# Patient Record
Sex: Female | Born: 1980
Health system: Southern US, Community
[De-identification: ages and names within clinical notes are randomized; demographics above are authoritative.]

## PROBLEM LIST (undated history)

## (undated) DIAGNOSIS — K219 Gastro-esophageal reflux disease without esophagitis: Secondary | ICD-10-CM

## (undated) DIAGNOSIS — F329 Major depressive disorder, single episode, unspecified: Secondary | ICD-10-CM

## (undated) DIAGNOSIS — I10 Essential (primary) hypertension: Secondary | ICD-10-CM

## (undated) DIAGNOSIS — F32A Depression, unspecified: Secondary | ICD-10-CM

## (undated) DIAGNOSIS — E039 Hypothyroidism, unspecified: Secondary | ICD-10-CM

## (undated) DIAGNOSIS — J189 Pneumonia, unspecified organism: Secondary | ICD-10-CM

## (undated) DIAGNOSIS — R51 Headache: Secondary | ICD-10-CM

## (undated) DIAGNOSIS — D649 Anemia, unspecified: Secondary | ICD-10-CM

## (undated) HISTORY — PX: OTHER SURGICAL HISTORY: SHX169

## (undated) HISTORY — DX: Essential (primary) hypertension: I10

---
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
## 1999-01-17 ENCOUNTER — Inpatient Hospital Stay (HOSPITAL_COMMUNITY): Admission: AD | Admit: 1999-01-17 | Discharge: 1999-01-17 | Payer: Self-pay | Admitting: Family Medicine

## 1999-06-09 ENCOUNTER — Inpatient Hospital Stay (HOSPITAL_COMMUNITY): Admission: EM | Admit: 1999-06-09 | Discharge: 1999-06-09 | Payer: Self-pay | Admitting: Obstetrics & Gynecology

## 2000-08-19 ENCOUNTER — Emergency Department (HOSPITAL_COMMUNITY): Admission: EM | Admit: 2000-08-19 | Discharge: 2000-08-19 | Payer: Self-pay

## 2000-12-28 ENCOUNTER — Inpatient Hospital Stay (HOSPITAL_COMMUNITY): Admission: AD | Admit: 2000-12-28 | Discharge: 2000-12-28 | Payer: Self-pay | Admitting: Obstetrics and Gynecology

## 2001-01-13 ENCOUNTER — Inpatient Hospital Stay (HOSPITAL_COMMUNITY): Admission: AD | Admit: 2001-01-13 | Discharge: 2001-01-13 | Payer: Self-pay | Admitting: Obstetrics and Gynecology

## 2001-04-14 ENCOUNTER — Inpatient Hospital Stay (HOSPITAL_COMMUNITY): Admission: AD | Admit: 2001-04-14 | Discharge: 2001-04-16 | Payer: Self-pay | Admitting: Obstetrics and Gynecology

## 2001-04-29 ENCOUNTER — Inpatient Hospital Stay (HOSPITAL_COMMUNITY): Admission: AD | Admit: 2001-04-29 | Discharge: 2001-04-29 | Payer: Self-pay | Admitting: Obstetrics & Gynecology

## 2001-05-02 ENCOUNTER — Inpatient Hospital Stay (HOSPITAL_COMMUNITY): Admission: AD | Admit: 2001-05-02 | Discharge: 2001-05-02 | Payer: Self-pay | Admitting: Obstetrics & Gynecology

## 2001-05-07 ENCOUNTER — Inpatient Hospital Stay (HOSPITAL_COMMUNITY): Admission: AD | Admit: 2001-05-07 | Discharge: 2001-05-07 | Payer: Self-pay | Admitting: Obstetrics and Gynecology

## 2001-05-15 ENCOUNTER — Inpatient Hospital Stay (HOSPITAL_COMMUNITY): Admission: AD | Admit: 2001-05-15 | Discharge: 2001-05-15 | Payer: Self-pay | Admitting: Obstetrics & Gynecology

## 2001-05-22 ENCOUNTER — Inpatient Hospital Stay (HOSPITAL_COMMUNITY): Admission: AD | Admit: 2001-05-22 | Discharge: 2001-05-25 | Payer: Self-pay | Admitting: Obstetrics and Gynecology

## 2001-07-06 ENCOUNTER — Emergency Department (HOSPITAL_COMMUNITY): Admission: EM | Admit: 2001-07-06 | Discharge: 2001-07-07 | Payer: Self-pay | Admitting: *Deleted

## 2001-11-13 ENCOUNTER — Emergency Department (HOSPITAL_COMMUNITY): Admission: EM | Admit: 2001-11-13 | Discharge: 2001-11-13 | Payer: Self-pay | Admitting: Emergency Medicine

## 2003-02-18 ENCOUNTER — Emergency Department (HOSPITAL_COMMUNITY): Admission: EM | Admit: 2003-02-18 | Discharge: 2003-02-18 | Payer: Self-pay | Admitting: *Deleted

## 2003-12-13 ENCOUNTER — Other Ambulatory Visit: Admission: RE | Admit: 2003-12-13 | Discharge: 2003-12-13 | Payer: Self-pay | Admitting: Family Medicine

## 2004-01-09 ENCOUNTER — Emergency Department (HOSPITAL_COMMUNITY): Admission: EM | Admit: 2004-01-09 | Discharge: 2004-01-09 | Payer: Self-pay | Admitting: *Deleted

## 2004-06-09 ENCOUNTER — Other Ambulatory Visit: Admission: RE | Admit: 2004-06-09 | Discharge: 2004-06-09 | Payer: Self-pay | Admitting: Obstetrics and Gynecology

## 2005-02-05 ENCOUNTER — Other Ambulatory Visit: Admission: RE | Admit: 2005-02-05 | Discharge: 2005-02-05 | Payer: Self-pay | Admitting: Obstetrics and Gynecology

## 2005-03-09 ENCOUNTER — Emergency Department (HOSPITAL_COMMUNITY): Admission: EM | Admit: 2005-03-09 | Discharge: 2005-03-09 | Payer: Self-pay | Admitting: Family Medicine

## 2005-04-21 ENCOUNTER — Emergency Department (HOSPITAL_COMMUNITY): Admission: EM | Admit: 2005-04-21 | Discharge: 2005-04-21 | Payer: Self-pay | Admitting: Family Medicine

## 2005-06-15 ENCOUNTER — Other Ambulatory Visit: Admission: RE | Admit: 2005-06-15 | Discharge: 2005-06-15 | Payer: Self-pay | Admitting: Obstetrics and Gynecology

## 2005-06-24 ENCOUNTER — Ambulatory Visit (HOSPITAL_COMMUNITY): Admission: RE | Admit: 2005-06-24 | Discharge: 2005-06-24 | Payer: Self-pay | Admitting: Obstetrics and Gynecology

## 2005-10-21 ENCOUNTER — Inpatient Hospital Stay (HOSPITAL_COMMUNITY): Admission: AD | Admit: 2005-10-21 | Discharge: 2005-10-21 | Payer: Self-pay | Admitting: Obstetrics and Gynecology

## 2005-10-30 ENCOUNTER — Inpatient Hospital Stay (HOSPITAL_COMMUNITY): Admission: AD | Admit: 2005-10-30 | Discharge: 2005-10-30 | Payer: Self-pay | Admitting: Obstetrics and Gynecology

## 2005-11-15 ENCOUNTER — Inpatient Hospital Stay (HOSPITAL_COMMUNITY): Admission: AD | Admit: 2005-11-15 | Discharge: 2005-11-15 | Payer: Self-pay | Admitting: Obstetrics and Gynecology

## 2005-11-20 ENCOUNTER — Inpatient Hospital Stay (HOSPITAL_COMMUNITY): Admission: AD | Admit: 2005-11-20 | Discharge: 2005-11-20 | Payer: Self-pay | Admitting: Obstetrics and Gynecology

## 2006-01-03 ENCOUNTER — Inpatient Hospital Stay (HOSPITAL_COMMUNITY): Admission: AD | Admit: 2006-01-03 | Discharge: 2006-01-03 | Payer: Self-pay | Admitting: Obstetrics and Gynecology

## 2006-01-14 ENCOUNTER — Inpatient Hospital Stay (HOSPITAL_COMMUNITY): Admission: AD | Admit: 2006-01-14 | Discharge: 2006-01-14 | Payer: Self-pay | Admitting: Obstetrics and Gynecology

## 2006-01-16 ENCOUNTER — Inpatient Hospital Stay (HOSPITAL_COMMUNITY): Admission: AD | Admit: 2006-01-16 | Discharge: 2006-01-16 | Payer: Self-pay | Admitting: Obstetrics and Gynecology

## 2006-01-24 ENCOUNTER — Inpatient Hospital Stay (HOSPITAL_COMMUNITY): Admission: AD | Admit: 2006-01-24 | Discharge: 2006-01-24 | Payer: Self-pay | Admitting: Obstetrics and Gynecology

## 2006-01-28 ENCOUNTER — Inpatient Hospital Stay (HOSPITAL_COMMUNITY): Admission: AD | Admit: 2006-01-28 | Discharge: 2006-01-31 | Payer: Self-pay | Admitting: Obstetrics and Gynecology

## 2006-02-06 ENCOUNTER — Emergency Department (HOSPITAL_COMMUNITY): Admission: EM | Admit: 2006-02-06 | Discharge: 2006-02-06 | Payer: Self-pay | Admitting: Family Medicine

## 2007-03-16 ENCOUNTER — Encounter: Admission: RE | Admit: 2007-03-16 | Discharge: 2007-03-16 | Payer: Self-pay | Admitting: Emergency Medicine

## 2007-06-05 ENCOUNTER — Emergency Department (HOSPITAL_COMMUNITY): Admission: EM | Admit: 2007-06-05 | Discharge: 2007-06-05 | Payer: Self-pay | Admitting: Emergency Medicine

## 2007-10-08 ENCOUNTER — Inpatient Hospital Stay (HOSPITAL_COMMUNITY): Admission: AD | Admit: 2007-10-08 | Discharge: 2007-10-08 | Payer: Self-pay | Admitting: Obstetrics & Gynecology

## 2007-10-11 ENCOUNTER — Inpatient Hospital Stay (HOSPITAL_COMMUNITY): Admission: AD | Admit: 2007-10-11 | Discharge: 2007-10-11 | Payer: Self-pay | Admitting: Gynecology

## 2007-10-20 ENCOUNTER — Ambulatory Visit: Payer: Self-pay | Admitting: Obstetrics & Gynecology

## 2007-10-21 ENCOUNTER — Emergency Department (HOSPITAL_COMMUNITY): Admission: EM | Admit: 2007-10-21 | Discharge: 2007-10-21 | Payer: Self-pay | Admitting: Family Medicine

## 2007-11-22 ENCOUNTER — Ambulatory Visit (HOSPITAL_COMMUNITY): Admission: RE | Admit: 2007-11-22 | Discharge: 2007-11-22 | Payer: Self-pay | Admitting: Obstetrics & Gynecology

## 2008-04-06 ENCOUNTER — Emergency Department (HOSPITAL_COMMUNITY): Admission: EM | Admit: 2008-04-06 | Discharge: 2008-04-06 | Payer: Self-pay | Admitting: Emergency Medicine

## 2009-08-26 ENCOUNTER — Ambulatory Visit: Payer: Self-pay | Admitting: Obstetrics & Gynecology

## 2009-08-26 LAB — CONVERTED CEMR LAB: Pap Smear: NEGATIVE

## 2009-10-03 ENCOUNTER — Ambulatory Visit (HOSPITAL_COMMUNITY): Admission: RE | Admit: 2009-10-03 | Discharge: 2009-10-03 | Payer: Self-pay | Admitting: Obstetrics & Gynecology

## 2009-10-03 ENCOUNTER — Ambulatory Visit: Payer: Self-pay | Admitting: Obstetrics & Gynecology

## 2009-10-30 ENCOUNTER — Ambulatory Visit: Payer: Self-pay | Admitting: Obstetrics & Gynecology

## 2009-10-30 ENCOUNTER — Ambulatory Visit (HOSPITAL_COMMUNITY): Admission: RE | Admit: 2009-10-30 | Discharge: 2009-10-30 | Payer: Self-pay | Admitting: Obstetrics & Gynecology

## 2010-01-09 ENCOUNTER — Ambulatory Visit (HOSPITAL_COMMUNITY)
Admission: RE | Admit: 2010-01-09 | Discharge: 2010-01-09 | Payer: Self-pay | Source: Home / Self Care | Attending: Obstetrics and Gynecology | Admitting: Obstetrics and Gynecology

## 2010-01-09 ENCOUNTER — Ambulatory Visit: Payer: Self-pay | Admitting: Obstetrics and Gynecology

## 2010-02-16 ENCOUNTER — Encounter: Payer: Self-pay | Admitting: Obstetrics and Gynecology

## 2010-02-17 ENCOUNTER — Encounter: Payer: Self-pay | Admitting: Obstetrics & Gynecology

## 2010-04-10 LAB — PREGNANCY, URINE: Preg Test, Ur: NEGATIVE

## 2010-04-10 LAB — CBC
HCT: 41.7 % (ref 36.0–46.0)
MCH: 33.3 pg (ref 26.0–34.0)
RDW: 13.7 % (ref 11.5–15.5)

## 2010-06-10 NOTE — Group Therapy Note (Signed)
NAME:  Diana Francis, Diana Francis NO.:  [id number]   MEDICAL RECORD NO.:  [id number]          PATIENT TYPE:  WOC   LOCATION:  [facility]                   FACILITY:  [facility]   PHYSICIAN:  Johnella Moloney, MD        DATE OF BIRTH:  [birth year]   DATE OF SERVICE:  10/20/2007                                  CLINIC NOTE   CHIEF COMPLAINT:  Ovarian cyst and light bleeding for 2 months.   HISTORY OF PRESENT ILLNESS:  The patient is a 30 year old gravida 3,  para 2-0-1-2 with a last menstrual period of August 19, 2007.  Of note,  patient is on a Mirena IUD,  who is here for 2 complaints.  First,  patient reports having left lower quadrant pain, and an ultrasound that  was done on October 11, 2007 showed a 4.1 simple cyst on the left  ovary.  The patient's 2nd issue is irregular bleeding.  She does report  having an IUD in place, and she has not had a menstrual period since  July 2009.  However, has had daily spotting.  The patient reports that  she has a history of a prolactinoma and severe migraines which precludes  the use of estrogen.  She wants to know if there is any other mode of  management of this irregular spotting.  Of note patient also reports  fevers.  She had a fever of 102 degrees Fahrenheit yesterday, and on  evaluation today she is noted to be 100.9.  She also has chills and  nausea.  She has 2 children that were treated for sore throat and  fevers.  She is worried about the H1N1 flu, and wants to be possibly  treated with Tamiflu; no other symptoms.   PAST OBSTETRIC/GYNECOLOGICAL HISTORY:  Menarche at age 43, regular  menstrual cycles, medium flow of the cycle, mild bleeding, irregular  menstrual periods since her Mirena IUD placement.  The patient has had 2  vaginal deliveries.  Her last delivery was on January 29, 2006.  She also  has had 1 miscarriage.  Her last Pap smear was on March 23rd, 2009 which  was normal, but she does have a history of abnormal Pap smear in  January  2006 which resulted in a LEEP.   PAST MEDICAL HISTORY:  1. Prolactinoma.  2. Migraines.  3. Pneumonia.   PAST SURGICAL HISTORY:  None.   SOCIAL HISTORY:  The patient lives with her 3 children.  She is  employed.  She smokes 1/2 pack a day and has smoked for 6 years.  She  denies any alcohol or illicit drug use.  She also denies any past or  current history of sexual or physical abuse.   FAMILIAL HISTORY:  Remarkable for diabetes, high blood pressure, heart  attack, and a grandfather who has lung, spinal, and shoulder cancer.  No  gynecologic cancers.   REVIEW OF SYSTEMS:  Remarkable for fevers, nausea, vomiting, and vaginal  bleeding.   PHYSICAL EXAMINATION:  Temperature 100.9, pulse 84, blood pressure  118/84, weight 143.1 pounds, height 64-1/2 inches.  GENERAL:  No apparent distress.  ABDOMEN:  Mild tenderness on palpation in the left lower quadrant.  No  rebound or guarding.  PELVIC:  Deferred as per patient request.   ASSESSMENT/PLAN:  The patient is a 30 year old gravida 3, para 2-0-1-2,  here with 2 main complaints.  As for her irregular spotting the patient  was told that this was a normal side effect of the Mirena IUD.  Sometimes spotting could be alleviated with some estrogen administration  in the form of oral contraceptive pills to stabilize the endometrial  lining.  However, given her history of a prolactinoma, severe migraines,  she is not a candidate for this.  She was told that it was always  possible to change birth control methods if this irregular bleeding  continues to be bothersome to her.  As for her ovarian cyst she did have  a simple cyst which is a physiologic cyst which could be causing her  pain.  The patient was offered Voltaren 50 mg p.o. t.i.d. p.r.n. pain.  Given that she does not have any relief after using Motrin, the patient  did agree with plan.  We will schedule her for another ultrasound in 6  weeks to document resolution of the  cyst.  She was told that ovarian  cysts were normal and happen monthly and is a normal physiologic  process, and that this could occur even when a Mirena IUD in place.  As  for the patient's fever, malaise, and possible exposure to children with  flu, the patient was told to follow up with her primary care physician  who will evaluate her and probably give her prescription for Tamiflu.           ______________________________  Johnella Moloney, MD     UD/MEDQ  D:  10/20/2007  T:  10/21/2007  Job:  [id number]

## 2010-06-10 NOTE — Assessment & Plan Note (Signed)
NAME:  Diana Francis, DEKONING NO.:  [id number]   MEDICAL RECORD NO.:  [id number]          PATIENT TYPE:  WOC   LOCATION:  [facility]         FACILITY:  [facility]   PHYSICIAN:  Allie Bossier, MD        DATE OF BIRTH:  25-Nov-1980   DATE OF SERVICE:  08/26/2009                                  CLINIC NOTE   Cheray is a 30 year old divorced, G3, P3, A1.  She is raising her 2  children plus an adopted child.  Her children are ages 60, 73, and 3 who  is here because she would like to have her IUD removed and would like to  discuss birth control options.  She is not keen on having abdominal  surgery and after discussing the Essure, she would like this form of  birth control.  She understands that the Essure will not be effective  for 3 months and she plans to leave her IUD in place until the tubal  occlusion is confirmed by the [facility].  She has no medical problems and her  only surgery was a LEEP done in 2004.   FAMILY HISTORY:  Negative for breast, GYN, and colon malignancies.   Only allergy is that to a BEE STING.  She is not allergic to medicines  or latex.   REVIEW OF SYSTEMS:  She had her Pap smear done last in 2009 and it was  normal.   MEDICATIONS:  The Mirena IUD was placed on March 2009.  She takes  ibuprofen on a p.r.n. basis.   PHYSICAL EXAMINATION:  GENERAL:  Well-nourished, well-hydrated female.  VITAL SIGNS:  Blood pressure 104/61, weight 147, height 5 feet 4 inches,  pulse 86, temperature 98.2.  HEENT:  Normal.  BREASTS:  Normal bilaterally.  HEART:  Regular rate and rhythm.  LUNGS:  Clear to auscultation bilaterally.  ABDOMEN:  Benign.  No palpable hepatosplenomegaly.  EXTERNAL GENITALIA:  No lesions.  Cervix normal status post LEEP.  IUD  strings are visible.  Uterus normal size and shape, anteverted, mobile,  slightly deviated to the left.  Adnexa nontender.  No masses.   ASSESSMENT AND PLAN:  1. Annual exam.  I have checked Pap smear and  recommended self-breast      exam.  2. Desires for sterility.  She understands there is a very very small      failure rate approximately 1:100 or less with the Essure and she      understands this will be a same day procedure.  All questions were      answered.  I will see her back 6 weeks after her surgery and her      surgery will be scheduled at the first availability.      Allie Bossier, MD    MCD/MEDQ  D:  08/26/2009  T:  08/27/2009  Job:  [id number]

## 2010-06-13 NOTE — Discharge Summary (Signed)
[facility]  Patient:    SULA, FETTERLY Visit Number: [id number] MRN: [id number]          Service Type: OBS Location: [facility] Attending Physician:  Mickle Mallory Dictated by:   Leilani Able, P.A. Admit Date:  05/15/2001 Discharge Date: 05/15/2001                             Discharge Summary  FINAL DIAGNOSES:              1. _______ [redacted] weeks gestation.                               2. Preterm labor.  HOSPITAL COURSE:              This 30 year old, G2, P0, presents at 33+ weeks gestation with preterm labor that was unresponsive to oral terbutaline and fluid. The patient was admitted for magnesium sulfate and Unasyn. Magnesium sulfate stopped her contractions and was slowly decreased and she was started on oral Procardia 10 mg every eight hours. Her contractions continued to be stopped even just with the oral Procardia, and, therefore, she was ready to be sent home.  DISPOSITION:                  The patient was sent home no a regular diet to continue rest and increase fluids.  DISCHARGE MEDICATIONS:        The patient was given Procardia 10 mg one t.i.d.  DISCHARGE FOLLOWUP:           The patient was to follow up on the office in three days. The patient was also to call if contractions began again. Dictated by:   Leilani Able, P.A. Attending Physician:  Mickle Mallory DD:  05/16/01 TD:  05/17/01 Job: [id number]

## 2010-06-13 NOTE — Op Note (Signed)
[facility]  Patient:    Diana Francis, Diana Francis Visit Number: [id number] MRN: [id number]          Service Type: OBS Location: 910A 9109 01 Attending Physician:  Miguel Aschoff Dictated by:   Devoria Albe Edward Jolly, M.D. Proc. Date: 05/23/01 Admit Date:  05/22/2001                             Operative Report  PREOPERATIVE DIAGNOSES:       1. Intrauterine gestation at 39+1 weeks.                               2. Nonreassuring fetal assessment.                               3. Meconium-stained amniotic fluid.  POSTOPERATIVE DIAGNOSES:      1. Intrauterine gestation at 39+1 weeks.                               2. Nonreassuring fetal assessment.                               3. Meconium-stained amniotic fluid.  PROCEDURE:                    Vacuum-assisted vaginal delivery with episiotomy and repair.  SURGEON:                      Brook A. Edward Jolly, M.D.  ESTIMATED BLOOD LOSS:         Less than 500 cc.  COMPLICATIONS:                None.  INDICATIONS FOR PROCEDURE:    The patient was a 30 year old gravida 2, para 0-0-1-0 African-American female at 39+[redacted] weeks gestation ([facility] May 28, 2001) who presented to the [facility] on May 23, 2001 reporting contractions since May 21, 2001.  Upon arrival to the [facility] on May 22, 2001 at approximately 2345, the cervix was noted to be 2-3 cm dilated.  The patient continued to have contractions overnight.  The next morning, her cervix was noted to be 3 cm dilated with 70% effacement and the vertex ballotable.  The membranes were noted to be intact.  The patient had a reassuring fetal heart rate tracing since admission.  She was noted to contract every 4-5 minutes. Pitocin augmentation of labor was performed and the patient went on to have artificial rupture of membranes, at which time time moderate meconium-stained amniotic fluid was noted.  An amnio infusion was therefore performed and a fetal scalp electrode  was placed.  The patient did receive an epidural for anesthesia during her labor.  The patients cervical dilation was complete at 1710.  At this time, deep variables were noted to the 80s with pushing.  A recommendation was made to proceed with a vacuum-assisted vaginal delivery based on the nonreassuring fetal assessment and the meconium-stained amniotic fluid.  The patient and family agreed to proceed after the risks and benefits were reviewed.  FINDINGS:                     A viable female was delivered  at 1742 with Apgars of 8 at one minute at 9 at five minutes.  The nares and mouth were suctioned on the perineum and lightly-stained amniotic fluid was obtained. There was a foot cord noted x2.  The placenta was noted to be delivered intact with a normal insertion of a three-vessel cord.  There were calcifications appreciated.  Cord pH was noted to be 7.22.  DESCRIPTION OF PROCEDURE:     With an epidural and IV in place, the patient was examined and the vertex was noted to be at the +3 station with a pushing effort.  The Foley catheter had just been removed.  The perineum was prepped and draped and was injected with 1% lidocaine.  The Mityvac was placed over the vertex and a midline episiotomy was performed.  Over two maternal efforts, the vertex was delivered.  DeLee suction was then performed of the nares and mouth on the perineum.  The pediatric team was in attendance at this time. The remainder of the infant was delivered.  The cord was then clamped and cut and the newborn was carried over to the pediatricians.  The placenta was then delivered spontaneously.  Examination of the vagina and cervix was performed and there were no lacerations other than the midline episiotomy and bilateral vulvar and supraurethral abrasions.  Repair of the episiotomy was performed in standard fashion with 2-0 Vicryl.  There were no complications to the procedure.  All sponge, needle and instrument  counts were correct. Dictated by:   Devoria Albe Edward Jolly, M.D. Attending Physician:  Miguel Aschoff DD:  05/23/01 TD:  05/24/01 Job: [id number]

## 2010-06-13 NOTE — H&P (Signed)
NAME:  Diana Francis, Diana Francis NO.:  [id number]   MEDICAL RECORD NO.:  [id number]          PATIENT TYPE:  MAT   LOCATION:  [facility]                          [facility]  [facility]   PHYSICIAN:  Charles A. Delcambre, MDDATE OF BIRTH:  [birth year]   DATE OF ADMISSION:  01/24/2006  DATE OF DISCHARGE:  01/24/2006                              HISTORY & PHYSICAL   HISTORY OF PRESENT ILLNESS:  This patient is a 30 year old gravida 3,  para 1, 0-1-1, EDC 01/29/2006 at 39 weeks and 6 days, having NST  secondary to some vaginal bleeding that she has had during the  pregnancy, with an NST today overall reactive but with 2 late  decelerations, 1 subtle and 1 obvious, with an obvious contraction.  She  is therefore to be admitted for induction.  She states that fetal  movement is somewhat decreased at this time, but she has felt movement  and there was movement marked on the NST.  She last had biophysical  profile on 01/25/06 which was 10/10, and reports no bleeding since that  time or rupture of membranes.  She does have contractions, but not on a  regular basis.   PAST MEDICAL HISTORY:  History of a pituitary adenoma with negative  prolactin check since that time.   SURGICAL HISTORY:  STD x1, LEEP to the cervix.   MEDICATIONS:  Prenatal vitamins.   ALLERGIES:  No known drug allergies.   SOCIAL HISTORY:  No tobacco, ethanol or drug abuse.  She is married in a  monogamous relationship with her husband.  She has smoked in the past,  but not currently.   FAMILY HISTORY:  Father hypertension, otherwise negative template.   REVIEW OF SYSTEMS:  No headaches, chest pain, shortness of breath,  wheezing, scotomata, right upper quadrant pain, bleeding, diarrhea,  constipation or hematuria, urgency or frequency.   PHYSICAL EXAMINATION:  GENERAL:  Alert and oriented x3.  VITAL SIGNS:  Blood pressure 122/78, weight 205 pounds, respirations 18,  pulse 90.  HEENT:  Grossly within normal limits.  CORONARY:  Regular rate and rhythm, 2/6 systolic ejection murmur left  sternal border.  LUNGS:  Clear bilaterally.  BREASTS:  No masses, tenderness, discharge, skin or nipple changes  bilaterally.  ABDOMEN:  Fundal height 39, gravid.  Estimated fetal weight 3600 grams.  PELVIC:  Normal external female genitalia.  Bartholin's, urethral, and  Skene's glands normal.  Vault without discharge or lesions.  Small  amount of brownish discharge is present, consistent with spotting  possibly.  Cervix is soft, posterior and 1 cm, and 25% effaced.  EXTREMITIES:  Nontender.  Minimal edema.   LABORATORIES:  Blood type B-positive, antibody screen negative, Sickle  cell negative, VDRL nonreactive, rubella immune, hepatitis B surface  antigen negative, HIV negative, pap negative, gonorrhea and Chlamydia  negative each, cystic fibrosis negative, TSH normal, hemoglobin 11.9 at  28 weeks, 1-hour Glucola 85 and group B strep negative at 36 weeks.   ASSESSMENT:  Intrauterine pregnancy 39 weeks 6 days, suspicious  nonstress test.   PLAN:  Admit for induction.  We will start with low-dose Pitocin and if  possible break her water later this afternoon and place internal  monitors.  If she has a reassuring course during the day and no  significant response to Pitocin, may decide to stop Pitocin this evening  and go with the Cervidil overnight.  She is informed and agrees to care  plan.      Charles A. Sydnee Cabal, MD  Electronically Signed     CAD/MEDQ  D:  01/28/2006  T:  01/28/2006  Job:  [id number]

## 2010-09-01 ENCOUNTER — Emergency Department (HOSPITAL_COMMUNITY)
Admission: EM | Admit: 2010-09-01 | Discharge: 2010-09-01 | Disposition: A | Discharge status: Home / Self Care | Payer: 59 | Attending: Emergency Medicine | Admitting: Emergency Medicine

## 2010-09-01 ENCOUNTER — Emergency Department (HOSPITAL_COMMUNITY): Payer: 59

## 2010-09-01 ENCOUNTER — Inpatient Hospital Stay (INDEPENDENT_AMBULATORY_CARE_PROVIDER_SITE_OTHER)
Admission: RE | Admit: 2010-09-01 | Discharge: 2010-09-01 | Disposition: A | Discharge status: Home / Self Care | Payer: 59 | Source: Ambulatory Visit | Attending: Emergency Medicine | Admitting: Emergency Medicine

## 2010-09-01 DIAGNOSIS — R10814 Left lower quadrant abdominal tenderness: Secondary | ICD-10-CM

## 2010-09-01 DIAGNOSIS — R109 Unspecified abdominal pain: Secondary | ICD-10-CM | POA: Insufficient documentation

## 2010-09-01 DIAGNOSIS — R1110 Vomiting, unspecified: Secondary | ICD-10-CM | POA: Insufficient documentation

## 2010-09-01 DIAGNOSIS — R10812 Left upper quadrant abdominal tenderness: Secondary | ICD-10-CM

## 2010-09-01 DIAGNOSIS — N83209 Unspecified ovarian cyst, unspecified side: Secondary | ICD-10-CM | POA: Insufficient documentation

## 2010-09-01 LAB — WET PREP, GENITAL
Clue Cells Wet Prep HPF POC: NONE SEEN
Trich, Wet Prep: NONE SEEN

## 2010-09-01 LAB — URINALYSIS, ROUTINE W REFLEX MICROSCOPIC
Bilirubin Urine: NEGATIVE
Glucose, UA: NEGATIVE mg/dL
Hgb urine dipstick: NEGATIVE
Ketones, ur: NEGATIVE mg/dL
Leukocytes, UA: NEGATIVE
Nitrite: NEGATIVE
Protein, ur: NEGATIVE mg/dL
pH: 7 (ref 5.0–8.0)

## 2010-09-01 LAB — DIFFERENTIAL
Lymphocytes Relative: 19 % (ref 12–46)
Monocytes Absolute: 0.6 10*3/uL (ref 0.1–1.0)
Neutro Abs: 7.5 10*3/uL (ref 1.7–7.7)

## 2010-09-01 LAB — POCT PREGNANCY, URINE
Preg Test, Ur: NEGATIVE
Preg Test, Ur: NEGATIVE

## 2010-09-01 LAB — POCT URINALYSIS DIP (DEVICE)
Bilirubin Urine: NEGATIVE
Ketones, ur: NEGATIVE mg/dL
Specific Gravity, Urine: 1.01 (ref 1.005–1.030)
pH: 6 (ref 5.0–8.0)

## 2010-09-01 LAB — BASIC METABOLIC PANEL
BUN: 5 mg/dL — ABNORMAL LOW (ref 6–23)
CO2: 25 mEq/L (ref 19–32)
Sodium: 138 mEq/L (ref 135–145)

## 2010-09-01 LAB — CBC
HCT: 34.6 % — ABNORMAL LOW (ref 36.0–46.0)
Hemoglobin: 12.1 g/dL (ref 12.0–15.0)
Platelets: 235 10*3/uL (ref 150–400)
WBC: 10.1 10*3/uL (ref 4.0–10.5)

## 2010-09-02 LAB — URINE CULTURE: Culture: NO GROWTH

## 2010-09-02 LAB — GC/CHLAMYDIA PROBE AMP, GENITAL
Chlamydia, DNA Probe: NEGATIVE
GC Probe Amp, Genital: NEGATIVE

## 2010-10-27 LAB — WET PREP, GENITAL
Trich, Wet Prep: NONE SEEN
Yeast Wet Prep HPF POC: NONE SEEN

## 2012-09-15 ENCOUNTER — Encounter (HOSPITAL_COMMUNITY): Payer: Self-pay | Admitting: Psychiatry

## 2012-09-15 ENCOUNTER — Ambulatory Visit (INDEPENDENT_AMBULATORY_CARE_PROVIDER_SITE_OTHER): Payer: 59 | Admitting: Psychiatry

## 2012-09-15 VITALS — BP 135/85 | HR 93 | Ht 64.0 in | Wt 132.2 lb

## 2012-09-15 DIAGNOSIS — F329 Major depressive disorder, single episode, unspecified: Secondary | ICD-10-CM

## 2012-09-15 NOTE — Progress Notes (Signed)
Patient ID: Diana Francis, female   DOB: [birth year], 32 y.o.   MRN: [id number] Psychiatric Assessment Note  Chief complaint Establish care, depression and anxiety  History of presenting illness. Patient is a 32 year old African American single employed female who is referred from her primary care physician Dr. Gregery Na for the treatment of depression and anxiety symptoms.  Last week patient was seen by primary care physician due to headache chest pain and hypertension and she completed a depression screening and she was referred to see a psychiatrist.  Patient was also found hypothyroidism however she has not started the medication .  She became very nervous because she never had hypothyroidism and hypertension before.  Patient appears very tense and stressed.  She could not express her symptoms very well however endorse that her past few months she has difficulty sleeping, racing thoughts having chest pain and feeling overwhelmed.  She also endorse a stressful job .  Her primary care physician took her out of work for 10 days.  She was started Zoloft however after taking one pill she became very nervous and sedated.  She call her primary care physician for recommended to stop Zoloft and tried Prozac.  She has not fill the prescription so far.  She wants to see psychiatrist .  Patient told her biggest stress is 49 -year-old adopted son who has behavior problem and ADD.  He had failed seventh grade and now school is going to start and she is very worried about him.  The patient has never told his son about the adoption .  She has 2 other children.  Patient has limited social and family support.  Patient is experiencing decreased energy, lack of motivation, lack of interest in dating things and recently noticed chest pain and having anxiety attacks.  Patient has never seen a psychiatrist before and today she feels very overwhelmed and anxious.  Most of the interview she was noticed crying and very nervous.   Patient told that she has a lot of things to say but she does not feel ready today.  She denies any hallucination, paranoia, active or passive suicidal thoughts.  She endorsed chronic fatigue, feeling overwhelmed and hopeless and helpless.  She admitted sometimes getting irritable and agitated but denies any aggression or violence towards her kids.  She wants to get better.  Patient also feels that she does not know how to communicate very well.  Patient told she is holding a lot of things in her chest for a long time.  She has never seen therapist .  Currently she is out of work for 10 days and is scheduled to restart work on Monday.  Past psychiatric history. Patient denies any history of psychiatric inpatient treatment, suicidal attempt, mania, psychosis or any hallucination.  She has never seen a psychiatrist or never been in therapy.  She was given last week Zoloft but after taking first pill she became more nervous.    Psychosocial history. Patient was born and raised in [place].  Her parents never married.  She adopted at 11-week-old child at age 50 .  The patient told she was visiting her aunt and her neighbor left the child with a promise that she would be back to it she agreed for babysitting.  Patient told she never come back and since then she is taking care of this child.  She's formally adopted this child soon after she has her daughter born.  Patient has another son who is 55 years old.  Patient married once but regrets her marriage.  She got divorced in 2009.  She did not provide much detail.  She has one brother who is a Hotel manager and another brother who lives with the patient.  The patient has a difficult relationship with his mother.  Patient has some support from her ex-husband.    Family history The patient endorsed mother has depression and aunt has bipolar disorder.  Alcohol and substance use history. Patient denies any history of alcohol or any illegal substances.  History  of abuse. The patient did not provide much information about history of abuse.    Past Medical History  Diagnosis Date  . Thyroid disease   . HTN (hypertension)    Patient sees Dr. Gregery Na at [facility] physician.    Education work history. Patient has a GED and currently working at [employer] the past 8 years.   Filed Vitals:   09/15/12 1330  BP: 135/85  Pulse: 93   No results found for this or any previous visit (from the past 2160 hour(s)).   Mental status examination Patient is a young female who appears thin and casually dressed.  She appears very anxious and nervous.  She was seen tearful most of the interview.  Her speech is soft but clear and coherent.  She has some difficulty expressing her symptoms.  She has some tremors and shakes .  She denies any active or passive suicidal thoughts or homicidal thoughts.  She denies any auditory or visual hallucination.  Her thought process is slow but logical.  Her fund of knowledge is average.  There were no paranoia or delusion obsession present at this time.  She is alert and oriented x3.  Her insight judgment and impulse control is okay.  Assessment Axis I depressive disorder NOS  Axis II deferred Axis III see medical history Axis IV mild to moderate Axis V 60-65  Plan I review her psychosocial stressors, symptoms , history and current medication.  Patient is very reluctant to take any psychotropic medication.  However she admitted a lot of psychosocial stressors but did not elaborate in detail.  Patient also recently diagnosed with hypertension and hypothyroidism .  She has not started the medication and schedule to see primary care physician .  I explained that antidepressants usually take some time to work.  I recommend she should try Zoloft 25 mg for at least one week to avoid any sedation and GI side effects.  I explained that the side effects are transient and usually go away.  However I have strongly recommended to see  therapist for coping and social skills.  We will get collateral information from her primary care physician including a recent blood work.  We will scheduled appointment with Victorino Dike for coping and social skills.  Recommend to call us back if she has any questions or concerns that if she feels worse to the symptom.

## 2012-09-20 ENCOUNTER — Ambulatory Visit (INDEPENDENT_AMBULATORY_CARE_PROVIDER_SITE_OTHER): Payer: 59 | Admitting: Psychiatry

## 2012-09-20 DIAGNOSIS — F411 Generalized anxiety disorder: Secondary | ICD-10-CM

## 2012-09-20 DIAGNOSIS — F329 Major depressive disorder, single episode, unspecified: Secondary | ICD-10-CM

## 2012-09-20 DIAGNOSIS — F39 Unspecified mood [affective] disorder: Secondary | ICD-10-CM

## 2012-09-20 DIAGNOSIS — F410 Panic disorder [episodic paroxysmal anxiety] without agoraphobia: Secondary | ICD-10-CM

## 2012-09-21 ENCOUNTER — Encounter (HOSPITAL_COMMUNITY): Payer: Self-pay | Admitting: Psychiatry

## 2012-09-21 ENCOUNTER — Telehealth (HOSPITAL_COMMUNITY): Payer: Self-pay | Admitting: Psychiatry
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
## 2012-09-22 ENCOUNTER — Encounter (HOSPITAL_COMMUNITY): Payer: Self-pay | Admitting: Psychiatry

## 2012-09-22 ENCOUNTER — Ambulatory Visit (INDEPENDENT_AMBULATORY_CARE_PROVIDER_SITE_OTHER): Payer: 59 | Admitting: Psychiatry

## 2012-09-22 VITALS — BP 131/95 | HR 113 | Ht 64.0 in | Wt 127.8 lb

## 2012-09-22 DIAGNOSIS — F419 Anxiety disorder, unspecified: Secondary | ICD-10-CM

## 2012-09-22 DIAGNOSIS — F329 Major depressive disorder, single episode, unspecified: Secondary | ICD-10-CM

## 2012-09-22 DIAGNOSIS — F411 Generalized anxiety disorder: Secondary | ICD-10-CM

## 2012-09-22 MED ORDER — MIRTAZAPINE 15 MG PO TABS: 15.0000 mg | ORAL_TABLET | Freq: Every day | ORAL | Status: DC

## 2012-09-22 MED ORDER — LORAZEPAM 0.5 MG PO TABS: 0.5000 mg | ORAL_TABLET | Freq: Every day | ORAL | Status: DC | PRN

## 2012-09-22 NOTE — Progress Notes (Signed)
[facility] [id number] Progress Note  Diana Francis [id number] 32 y.o.  09/22/2012 9:36 AM  Chief Complaint:  I cannot tolerate Zoloft.  It is making me nauseous and more nervous.  History of Present Illness:  Patient is 32 year old African American single employed female who was seen first time on August 21 gait for her followup appointment.  She was started on Zoloft 25 mg.  Despite low dose patient could not handle the dose and reporting side effects including nausea and throwing up.  She discontinued Zoloft.  She remains very anxious nervous and continues to have crying spells.  Recently she has medicated infection and given antibiotic.  She see Dr. Larene Beach.  She is taking omeprazole and when necessary Zofran.  Her nausea and vomiting is much improved.  She only took 2 days off Zoloft.  She is seeing therapist.  She continues to have depression and extreme anxiety.  She admitted at least one or 2 panic attack in past one week.  She has difficulty sleeping, having racing thoughts and continues to have chest pressure and pain.  However there is no shortness of breath, sweating or any radiation.  She has these chest pain which are related to panic attack.  She continues to have struggles with her son however her son agreed to repeat the seventh grade.  She is relieved because initially he was refusing to go school.  Patient endorses chronic feeling of fatigue, feeling overwhelmed, hopeless and helpless.  She denies any aggression or violence.  He denies any auditory or visual hallucination.  She is hopeful for for 10 days due to abdominal pain and vomiting.  She wants to try a different medication.  Suicidal Ideation: No Plan Formed: No Patient has means to carry out plan: No  Homicidal Ideation: No Plan Formed: No Patient has means to carry out plan: No  Review of Systems: Psychiatric: Agitation: No Hallucination: No Depressed Mood: Yes Insomnia: Yes Hypersomnia: No Altered  Concentration: No Feels Worthless: No Grandiose Ideas: No Belief In Special Powers: No New/Increased Substance Abuse: No Compulsions: No  Neurologic: Headache: Yes Seizure: No Paresthesias: No  Medical History;  Patient has hypertension and hypothyroidism.  She see Dr. Gregery Na at [facility].  She denies any history of traumatic brain injury, seizures or any concussion.  Psychosocial History: Patient was born and raised in [place]. Her parents never married. She adopted at 33-week-old child at age 49 . The patient told she was visiting her aunt and her neighbor left the child with a promise that she would be back if she can do babysitting. Patient told she never come back and since then she is taking care of this child. She's formally adopted this child soon after her daughter born. Patient has another son who is 72 years old. Patient married once but regrets her marriage. She got divorced in 2009. She did not provide much detail about her marriage. She has one brother who is in a Hotel manager and another brother who lives with the patient. The patient has a difficult relationship with her mother. Patient has some support from her ex-husband.   Family history The patient endorsed mother has depression and on has bipolar disorder.  Alcohol and substance use history. Patient denies any history of alcohol or illegal substance use.  Education and work history. Patient has a GED and currently working at [employer] for past 8 years.   Outpatient Encounter Prescriptions as of 09/22/2012  Medication Sig Dispense Refill  .  amoxicillin (AMOXIL) 500 MG capsule       . clarithromycin (BIAXIN) 500 MG tablet       . levothyroxine (SYNTHROID, LEVOTHROID) 100 MCG tablet       . omeprazole (PRILOSEC) 20 MG capsule       . LORazepam (ATIVAN) 0.5 MG tablet Take 1 tablet (0.5 mg total) by mouth daily as needed for anxiety.  30 tablet  0  . mirtazapine (REMERON) 15 MG tablet Take 1  tablet (15 mg total) by mouth at bedtime.  30 tablet  0  . [DISCONTINUED] sertraline (ZOLOFT) 25 MG tablet Take 25 mg by mouth daily.       No facility-administered encounter medications on file as of 09/22/2012.    No results found for this or any previous visit (from the past 2160 hour(s)).  Past Psychiatric History/Hospitalization(s) Patient denies any TB as history of psychiatric inpatient treatment or any suicidal attempt she has a history of mania, psychosis or any hallucination.  She was never seen a psychiatrist until her primary care physician referred to [place].  She visited primary care physician and appears very anxious and depressed , she completed a depression screening and she was referred to see psychiatrist .  She was given prescription of Prozac which she never tried.  We have tried Zoloft 25 mg however she stopped because of nausea and persistent vomiting.  Patient endorsed history of severe anxiety and chest pain due to panic attack.   Anxiety: Yes Bipolar Disorder: No Depression: Yes Mania: No Psychosis: No Schizophrenia: No Personality Disorder: No Hospitalization for psychiatric illness: No History of Electroconvulsive Shock Therapy: No Prior Suicide Attempts: No  Physical Exam: Constitutional:  BP 131/95  Pulse 113  Ht 5\' 4"  (1.626 m)  Wt 127 lb 12.8 oz (57.97 kg)  BMI 21.93 kg/m2  Musculoskeletal: Strength & Muscle Tone: within normal limits Gait & Station: normal Patient leans: N/A  Mental Status Examination;  Patient is a young female who appears thin and casually dressed.  She appears very anxious and tremulous.  During the conversation she was shaking her leg.  She appears easily tearful.  Her speech is soft however clear and coherent.  Her thought process is slow but logical and goal directed.  She appears tired and she has difficulty expressing her symptoms.  She denies any active or passive suicidal thoughts or homicidal thoughts.  She denies any auditory  or visual hallucination.  Her fund of knowledge is average.  There is no paranoia or delusions present at this time.  She's alert and oriented x3.  Her insight judgment and impulse control is okay.   Medical Decision Making (Choose Three): Review of Psycho-Social Stressors (1), Established Problem, Worsening (2), Review of Last Therapy Session (1), Review of Medication Regimen & Side Effects (2) and Review of New Medication or Change in Dosage (2)  Assessment: Axis I: Depressive disorder NOS, anxiety disorder NOS  Axis II: Deferred  Axis III:  Past Medical History  Diagnosis Date  . Thyroid disease   . HTN (hypertension)     Axis IV: Moderate   Plan:  I will discontinue Zoloft since patient having persistent nausea and vomiting.  I will try a non-SSRI antidepressant and anxiolytic.  We will try Remeron 15 mg at bedtime to help her sleep and anxiety.  I would also provide lorazepam 0.5 mg as needed for severe panic attack.  Discussed in detail the benzodiazepine dependence, withdrawal, tolerance and side effects.  Strongly encouraged to keep  appointment with a therapist for coping and social skills.  Followup in 2 weeks.Time spent 25 minutes.  More than 50% of the time spent in psychoeducation, counseling and coordination of care.  Discuss safety plan that anytime having active suicidal thoughts or homicidal thoughts then patient need to call 911 or go to the local emergency room.   Amellia Panik T., MD 09/22/2012

## 2012-09-27 ENCOUNTER — Encounter (HOSPITAL_COMMUNITY): Payer: Self-pay | Admitting: Psychiatry

## 2012-09-27 ENCOUNTER — Ambulatory Visit (INDEPENDENT_AMBULATORY_CARE_PROVIDER_SITE_OTHER): Payer: 59 | Admitting: Psychiatry

## 2012-09-27 DIAGNOSIS — F411 Generalized anxiety disorder: Secondary | ICD-10-CM

## 2012-09-27 DIAGNOSIS — F4310 Post-traumatic stress disorder, unspecified: Secondary | ICD-10-CM
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
## 2012-10-06 ENCOUNTER — Ambulatory Visit (INDEPENDENT_AMBULATORY_CARE_PROVIDER_SITE_OTHER): Payer: 59 | Admitting: Psychiatry

## 2012-10-06 ENCOUNTER — Encounter (HOSPITAL_COMMUNITY): Payer: Self-pay | Admitting: Psychiatry

## 2012-10-06 VITALS — BP 136/82 | HR 85 | Ht 64.0 in | Wt 130.6 lb

## 2012-10-06 DIAGNOSIS — F4310 Post-traumatic stress disorder, unspecified: Secondary | ICD-10-CM

## 2012-10-06 DIAGNOSIS — F329 Major depressive disorder, single episode, unspecified: Secondary | ICD-10-CM

## 2012-10-06 DIAGNOSIS — F411 Generalized anxiety disorder: Secondary | ICD-10-CM

## 2012-10-06 MED ORDER — LAMOTRIGINE 25 MG PO TABS: ORAL_TABLET | ORAL | Status: DC

## 2012-10-06 NOTE — Progress Notes (Signed)
[facility] [id number] Progress Note  Diana Francis [id number] 32 y.o.  10/06/2012 11:09 AM  Chief Complaint:  I am sleeping too much.    History of Present Illness:  Patient is 32 year old African American single employed female who came for her followup appointment.  On her last visit we started her on Remeron because she could not handle Zoloft and other SSRIs.  She denies any nausea or vomiting but complaining of excessive sedation all day.  He's also taking Ativan as needed however when she takes , she gets very sedated.  Patient continues to have some panic attack and very nervous leaving her home.  Patient is out from work since August 8 as per recommendation from her primary care physician Dr. Pollyann Kennedy .  It was extended 2 times and patient is scheduled to go back to work tomorrow.  Patient has mixed feeling about starting her job tomorrow.  She continues to have anxiety and nervousness.  She feels very scared leaving her house.  Recently she has intestinal infection and she was seen by her primary care physician who started her on antibiotic.  She finished her antibiotic .  Patient told her blood pressure was also high and she now taking thyroid medicine .  Patient continues to have crying spells but denies any suicidal thoughts or homicidal thoughts.  She seeing therapist regularly in this office.  She continues to have racing thoughts and had struggle with her son who has issues going to school.  Patient admitted feeling overwhelmed, hopeless and helpless but denies any aggression or violence.  Suicidal Ideation: No Plan Formed: No Patient has means to carry out plan: No  Homicidal Ideation: No Plan Formed: No Patient has means to carry out plan: No  Review of Systems: Psychiatric: Agitation: No Hallucination: No Depressed Mood: Yes Insomnia: Yes Hypersomnia: No Altered Concentration: No Feels Worthless: No Grandiose Ideas: No Belief In Special Powers:  No New/Increased Substance Abuse: No Compulsions: No  Neurologic: Headache: Yes Seizure: No Paresthesias: No  Medical History;  Patient has hypertension and hypothyroidism.  She see Dr. Pollyann Kennedy at [facility].  She denies any history of traumatic brain injury, seizures or any concussion.  Psychosocial History: Patient was born and raised in [place]. Her parents never married. She adopted at 13-week-old child at age 73 . The patient told she was visiting her aunt and her neighbor left the child with a promise that she would be back if she can do babysitting. Patient told she never come back and since then she is taking care of this child. She's formally adopted this child soon after her daughter born. Patient has another son who is 65 years old. Patient married once but regrets her marriage. She got divorced in 2009. She did not provide much detail about her marriage. She has one brother who is in a Hotel manager and another brother who lives with the patient. The patient has a difficult relationship with her mother. Patient has some support from her ex-husband.   Family history The patient endorsed mother has depression and on has bipolar disorder.  Alcohol and substance use history. Patient denies any history of alcohol or illegal substance use.  Education and work history. Patient has a GED and currently working at [employer] for past 8 years.   Outpatient Encounter Prescriptions as of 10/06/2012  Medication Sig Dispense Refill  . levothyroxine (SYNTHROID, LEVOTHROID) 100 MCG tablet       . LORazepam (ATIVAN) 0.5 MG tablet  Take 1 tablet (0.5 mg total) by mouth daily as needed for anxiety.  30 tablet  0  . mirtazapine (REMERON) 15 MG tablet Take 1 tablet (15 mg total) by mouth at bedtime.  30 tablet  0  . omeprazole (PRILOSEC) 20 MG capsule       . lamoTRIgine (LAMICTAL) 25 MG tablet Take 1 tab daily for 2 week and than 2 tab daily  45 tablet  0  . [DISCONTINUED]  amoxicillin (AMOXIL) 500 MG capsule       . [DISCONTINUED] clarithromycin (BIAXIN) 500 MG tablet        No facility-administered encounter medications on file as of 10/06/2012.    No results found for this or any previous visit (from the past 2160 hour(s)).  Past Psychiatric History/Hospitalization(s) Patient denies any TB as history of psychiatric inpatient treatment or any suicidal attempt she has a history of mania, psychosis or any hallucination.  She was never seen a psychiatrist until her primary care physician referred to [place].  She visited primary care physician and appears very anxious and depressed , she completed a depression screening and she was referred to see psychiatrist .  She was given prescription of Prozac which she never tried.  We have tried Zoloft 25 mg however she stopped because of nausea and persistent vomiting.  Patient endorsed history of severe anxiety and chest pain due to panic attack.   Anxiety: Yes Bipolar Disorder: No Depression: Yes Mania: No Psychosis: No Schizophrenia: No Personality Disorder: No Hospitalization for psychiatric illness: No History of Electroconvulsive Shock Therapy: No Prior Suicide Attempts: No  Physical Exam: Constitutional:  BP 136/82  Pulse 85  Ht 5\' 4"  (1.626 m)  Wt 130 lb 9.6 oz (59.24 kg)  BMI 22.41 kg/m2  Musculoskeletal: Strength & Muscle Tone: within normal limits Gait & Station: normal Patient leans: N/A  Mental Status Examination;  Patient is a young female who appears thin and casually dressed.  She appears very anxious and tremulous.  She is easily tearful.  Her speech is soft however clear and coherent.  Her thought process is slow but logical and goal directed.  She described her mood as tired and her affect is mood is constricted.  She denies any active or passive suicidal thoughts or homicidal thoughts.  She denies any auditory or visual hallucination.  Her fund of knowledge is average.  There is no paranoia or  delusions present at this time.  She's alert and oriented x3.  Her insight judgment and impulse control is okay.   Medical Decision Making (Choose Three): Review of Psycho-Social Stressors (1), Review and summation of old records (2), Established Problem, Worsening (2), Review of Last Therapy Session (1), Review of Medication Regimen & Side Effects (2) and Review of New Medication or Change in Dosage (2)  Assessment: Axis I: Depressive disorder NOS, anxiety disorder NOS  Axis II: Deferred  Axis III:  Past Medical History  Diagnosis Date  . Thyroid disease   . HTN (hypertension)     Axis IV: Moderate   Plan:  I recommend to try Ativan half tablet as needed to avoid any sedation during the day.  Recommend to continue Remeron at present does which is helping her sleep .  Patient has tried SSRI with limited response.  I recommend if half Ativan does not help her sedation that she should try Lamictal 25 mg daily for 2 weeks and gradually increase to 50 mg.  Patient is scheduled to see her primary care  physician, she will discuss about returning to work tomorrow.  She was taken out of work by her primary care physician in August 11th.  Strongly encouraged to keep appointment with a therapist for coping and social skills.  Followup in 2 weeks.Time spent 25 minutes.  More than 50% of the time spent in psychoeducation, counseling and coordination of care.  Discuss safety plan that anytime having active suicidal thoughts or homicidal thoughts then patient need to call 911 or go to the local emergency room.   Azaiah Mello T., MD 10/06/2012

## 2012-10-20 ENCOUNTER — Encounter (HOSPITAL_COMMUNITY): Payer: Self-pay | Admitting: Psychiatry

## 2012-10-20 ENCOUNTER — Telehealth (HOSPITAL_COMMUNITY): Payer: Self-pay | Admitting: Psychiatry

## 2012-10-20 ENCOUNTER — Ambulatory Visit (INDEPENDENT_AMBULATORY_CARE_PROVIDER_SITE_OTHER): Payer: 59 | Admitting: Psychiatry

## 2012-10-20 DIAGNOSIS — F4310 Post-traumatic stress disorder, unspecified: Secondary | ICD-10-CM

## 2012-10-20 DIAGNOSIS — F329 Major depressive disorder, single episode, unspecified: Secondary | ICD-10-CM
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
## 2012-10-26 ENCOUNTER — Other Ambulatory Visit (HOSPITAL_COMMUNITY): Payer: 59

## 2012-10-27 ENCOUNTER — Ambulatory Visit (HOSPITAL_COMMUNITY): Payer: Self-pay | Admitting: Psychiatry

## 2012-10-27 ENCOUNTER — Other Ambulatory Visit (HOSPITAL_COMMUNITY): Payer: 59

## 2012-10-28 ENCOUNTER — Other Ambulatory Visit (HOSPITAL_COMMUNITY): Payer: 59

## 2012-10-31 ENCOUNTER — Other Ambulatory Visit (HOSPITAL_COMMUNITY): Payer: 59

## 2012-11-01 ENCOUNTER — Ambulatory Visit (INDEPENDENT_AMBULATORY_CARE_PROVIDER_SITE_OTHER): Payer: 59 | Admitting: Psychiatry

## 2012-11-01 ENCOUNTER — Encounter (HOSPITAL_COMMUNITY): Payer: Self-pay | Admitting: Psychiatry

## 2012-11-01 ENCOUNTER — Other Ambulatory Visit (HOSPITAL_COMMUNITY): Payer: 59

## 2012-11-01 DIAGNOSIS — F4310 Post-traumatic stress disorder, unspecified: Secondary | ICD-10-CM

## 2012-11-01 DIAGNOSIS — F329 Major depressive disorder, single episode, unspecified: Secondary | ICD-10-CM

## 2012-11-01 NOTE — Progress Notes (Signed)
Patient ID: ALYLA PIETILA, female   DOB: [birth year], 32 y.o.   MRN: [id number]  Session Time: 11:00-11:50   Participation Level: Active   Behavioral Response: CasualAlertAnxious and Depressed   Type of Therapy: Individual Therapy   Treatment Goals addressed: emotion regulation, stress management   Interventions: CBT   Summary: LAURAN ROMANSKI is a 32 y.o. female who presents with depression and anxiety.   Suicidal/Homicidal: nowithout intent/plan   Therapist Response: Pt. Presented as calm and not tearful as in previous sessions. Pt. Reported current stressors as her brother's decision not to bring newborn home to her house and learning that her ex-boyfriend has been discussing Pt.'s readiness to return to work with other co-workers. Pt. Reported feeling that significant emotional burden was lifted with decision to ask ex-boyfriend to move out by the first of the year. Processed experience in IOP. Pt. Stated "I am not a quitter" and discussed plans to complete the IOP program when she has developed more distress tolerance skills. Processed themes related to learning to trust thoughts and feelings and learning to develop trust in others.   Plan: Return again in 2 weeks.   Diagnosis: Axis I: Depressive Disorder NOS   Axis II: deferred  Wynonia Musty  11/01/2012

## 2012-11-02 ENCOUNTER — Encounter: Payer: Self-pay | Admitting: Obstetrics & Gynecology

## 2012-11-02 ENCOUNTER — Other Ambulatory Visit (HOSPITAL_COMMUNITY): Payer: 59

## 2012-11-02 ENCOUNTER — Ambulatory Visit (INDEPENDENT_AMBULATORY_CARE_PROVIDER_SITE_OTHER): Payer: 59 | Admitting: Obstetrics & Gynecology

## 2012-11-02 VITALS — BP 126/86 | HR 86 | Temp 97.4°F | Ht 64.5 in | Wt 127.9 lb

## 2012-11-02 DIAGNOSIS — N949 Unspecified condition associated with female genital organs and menstrual cycle: Secondary | ICD-10-CM

## 2012-11-02 DIAGNOSIS — R102 Pelvic and perineal pain: Secondary | ICD-10-CM

## 2012-11-02 DIAGNOSIS — N938 Other specified abnormal uterine and vaginal bleeding: Secondary | ICD-10-CM

## 2012-11-02 DIAGNOSIS — N925 Other specified irregular menstruation: Secondary | ICD-10-CM

## 2012-11-02 NOTE — Patient Instructions (Addendum)
Endometriosis Endometriosis is a disease that occurs when the endometrium (lining of the uterus) is misplaced outside of its normal location. It may occur in many locations close to the uterus (womb), but commonly on the ovaries, fallopian tubes, vagina (birth canal) and bowel located close to the uterus. Because the uterus sloughs (expels) its lining every month (menses), there is bleeding whereever the endometrial tissue is located. SYMPTOMS  Often there are no symptoms. However, because blood is irritating to tissues not normally exposed to it, when symptoms occur they vary with the location of the misplaced endometrium. Symptoms often include back and abdominal pain. Periods may be heavier and intercourse may be painful. Infertility may be present. You may have all of these symptoms at one time or another or you may have months with no symptoms at all. Although the symptoms occur mainly during menses, they can occur mid-cycle as well, and usually terminate with menopause. DIAGNOSIS  Your caregiver may recommend a blood test and urine test (urinalysis) to help rule out other conditions. Another common test is ultrasound, a painless procedure that uses sound waves to make a sonogram "picture" of abnormal tissue that could be endometriosis. If your bowel movements are painful around your periods, your caregiver may advise a barium enema (an X-ray of the lower bowel), to try to find the source of your pain. This is sometimes confirmed by laparoscopy. Laparoscopy is a procedure where your caregiver looks into your abdomen with a laparoscope (a small pencil sized telescope). Your caregiver may take a tiny piece of tissue (biopsy) from any abnormal tissue to confirm or document your problem. These tissues are sent to the lab and a pathologist looks at them under the microscope to give a microscopic diagnosis. TREATMENT  Once the diagnosis is made, it can be treated by destruction of the misplaced endometrial  tissue using heat (diathermy), laser, cutting (excision), or chemical means. It may also be treated with hormonal therapy. When using hormonal therapy menses are eliminated, therefore eliminating the monthly exposure to blood by the misplaced endometrial tissue. Only in severe cases is it necessary to perform a hysterectomy with removal of the tubes, uterus and ovaries. HOME CARE INSTRUCTIONS   Only take over-the-counter or prescription medicines for pain, discomfort, or fever as directed by your caregiver.  Avoid activities that produce pain, including a physical sexual relationship.  Do not take aspirin as this may increase bleeding when not on hormonal therapy.  See your caregiver for pain or problems not controlled with treatment. SEEK IMMEDIATE MEDICAL CARE IF:   Your pain is severe and is not responding to pain medicine.  You develop severe nausea and vomiting, or you cannot keep foods down.  Your pain localizes to the right lower part of your abdomen (possible appendicitis).  You have swelling or increasing pain in the abdomen.  You have a fever.  You see blood in your stool. MAKE SURE YOU:   Understand these instructions.  Will watch your condition.  Will get help right away if you are not doing well or get worse. Document Released: 01/10/2000 Document Revised: 04/06/2011 Document Reviewed: 08/31/2007 [facility] Patient Information 2014 [place], [place]. Diagnostic Laparoscopy Laparoscopy is a surgical procedure. It is used to diagnose and treat diseases inside the belly(abdomen). It is usually a brief, common, and relatively simple procedure. The laparoscopeis a thin, lighted, pencil-sized instrument. It is like a telescope. It is inserted into your abdomen through a small cut (incision). Your caregiver can look at the organs  inside your body through this instrument. He or she can see if there is anything abnormal. Laparoscopy can be done either in a hospital or outpatient  clinic. You may be given a mild sedative to help you relax before the procedure. Once in the operating room, you will be given a drug to make you sleep (general anesthesia). Laparoscopy usually lasts less than 1 hour. After the procedure, you will be monitored in a recovery area until you are stable and doing well. Once you are home, it will take 2 to 3 days to fully recover. RISKS AND COMPLICATIONS  Laparoscopy has relatively few risks. Your caregiver will discuss the risks with you before the procedure. Some problems that can occur include:  Infection.  Bleeding.  Damage to other organs.  Anesthetic side effects. PROCEDURE Once you receive anesthesia, your surgeon inflates the abdomen with a harmless gas (carbon dioxide). This makes the organs easier to see. The laparoscope is inserted into the abdomen through a small incision. This allows your surgeon to see into the abdomen. Other small instruments are also inserted into the abdomen through other small openings. Many surgeons attach a video camera to the laparoscope to enlarge the view. During a diagnostic laparoscopy, the surgeon may be looking for inflammation, infection, or cancer. Your surgeon may take tissue samples(biopsies). The samples are sent to a specialist in looking at cells and tissue samples (pathologist). The pathologist examines them under a microscope. Biopsies can help to diagnose or confirm a disease. AFTER THE PROCEDURE   The gas is released from inside the abdomen.  The incisions are closed with stitches (sutures). Because these incisions are small (usually less than 1/2 inch), there is usually minimal discomfort after the procedure. There may be some mild discomfort in the throat. This is from the tube placed in the throat while you were sleeping. You may have some mild abdominal discomfort. There may also be discomfort from the instrument placement incisions in the abdomen.  The recovery time is shortened as long as  there are no complications.  You will rest in a recovery room until stable and doing well. As long as there are no complications, you may be allowed to go home. FINDING OUT THE RESULTS OF YOUR TEST Not all test results are available during your visit. If your test results are not back during the visit, make an appointment with your caregiver to find out the results. Do not assume everything is normal if you have not heard from your caregiver or the medical facility. It is important for you to follow up on all of your test results. HOME CARE INSTRUCTIONS   Take all medicines as directed.  Only take over-the-counter or prescription medicines for pain, discomfort, or fever as directed by your caregiver.  Resume daily activities as directed.  Showers are preferred over baths.  You may resume sexual activities in 1 week or as directed.  Do not drive while taking narcotics. SEEK MEDICAL CARE IF:   There is increasing abdominal pain.  There is new pain in the shoulders (shoulder strap areas).  You feel lightheaded or faint.  You have the chills.  You or your child has an oral temperature above 102 F (38.9 C).  There is pus-like (purulent) drainage from any of the wounds.  You are unable to pass gas or have a bowel movement.  You feel sick to your stomach (nauseous) or throw up (vomit). MAKE SURE YOU:   Understand these instructions.  Will watch your  condition.  Will get help right away if you are not doing well or get worse. Document Released: 04/20/2000 Document Revised: 04/06/2011 Document Reviewed: 01/12/2007 [facility] Patient Information 2014 [place], [place].

## 2012-11-02 NOTE — Progress Notes (Signed)
  Subjective:    Patient ID: Diana Francis, female    DOB: 04-11-1980, 32 y.o.   MRN: [id number]  HPI  32 yo D G3P2A1 here today for the complaint of pelvic pain since 2012. Sometimes dull but "excruciating" with her periods. Sometimes like a "stabbing pain". Daily. Tried IBU 800 mg with no relief. Pain radiates to anterior thighs, right more than left. Also in lower back.She denies hematochezia, dyspareunia since 2012, related to her period. No recent u/s.She reports a negative GC/CT test at [facility] in Sept. She denies a h/o STIs.   She got an Essure in 2011  She also reports night sweats for the last 8-9 months  Her periods occur every 2-3 weeks, heavy with clots, lasts 5-7 days.  She has been treated for a UTI and an "intestinal infection".   Review of Systems She declines flu vaccine. She works at [employer] but has not been there since 09/02/12 due to the pain. Pap due.     Objective:   Physical Exam        Assessment & Plan:

## 2012-11-02 NOTE — Progress Notes (Signed)
Patient here for c/o pelvic pain, back pain, N&V, abd pain, for a long time. States has been going back and forth to her medical doctor, and ER.  Has been treated for bacterial infections. Has been having 2 periods a month.

## 2012-11-03 ENCOUNTER — Ambulatory Visit (INDEPENDENT_AMBULATORY_CARE_PROVIDER_SITE_OTHER): Payer: 59 | Admitting: Psychiatry

## 2012-11-03 ENCOUNTER — Other Ambulatory Visit (HOSPITAL_COMMUNITY): Payer: 59

## 2012-11-03 ENCOUNTER — Encounter (HOSPITAL_COMMUNITY): Payer: Self-pay | Admitting: Psychiatry

## 2012-11-03 VITALS — BP 134/92 | HR 83 | Ht 64.5 in | Wt 127.4 lb

## 2012-11-03 DIAGNOSIS — F411 Generalized anxiety disorder: Secondary | ICD-10-CM

## 2012-11-03 DIAGNOSIS — F329 Major depressive disorder, single episode, unspecified: Secondary | ICD-10-CM

## 2012-11-03 DIAGNOSIS — F419 Anxiety disorder, unspecified: Secondary | ICD-10-CM

## 2012-11-03 MED ORDER — LORAZEPAM 0.5 MG PO TABS: 0.5000 mg | ORAL_TABLET | Freq: Every day | ORAL | Status: DC | PRN

## 2012-11-03 NOTE — Progress Notes (Signed)
[facility] [id number] Progress Note  MARGERITE IMPASTATO [id number] 32 y.o.  11/03/2012 10:52 AM  Chief Complaint:  I am sleeping better.      History of Present Illness:  Patient is 32 year old African American single employed female who came for her followup appointment.  She is not taking Remeron because she is sleeping better.  However she continues to have anxiety nervousness and depressive symptoms.  She was recommended by therapist for intensive outpatient program however after the first day patient feels more anxious , nervous and having rage and decided not to do program.  She is not taking Lamictal.  She was hoping that Ativan alone will help her .  However she admitted irritability, anger, depression, crying spells continues the same.  Her daughter may require cardiologist because she is having persistent blood pressure.  Patient also had chronic abdominal pain.  She was diagnosed with endometriosis and may require hysterectomy.  She stated to have ultrasound .  She was given Zofran for persistent nausea.  She is not going to work.  Her OB/GYN kept her out of work until end of this month.  Patient is seeing Victorino Dike her coping skills.  She denies any suicidal thoughts but admitted anhedonia, feelings of hopelessness, helplessness and is scared to leave her home.  She also endorsed panic attack however they are less intense from the past.  She is tolerating Ativan half tablet , is not causing excessive sedation.  Patient continues to feel overwhelmed sometimes.  Her appetite is unchanged from the past.  Suicidal Ideation: No Plan Formed: No Patient has means to carry out plan: No  Homicidal Ideation: No Plan Formed: No Patient has means to carry out plan: No  Review of Systems: Psychiatric: Agitation: No Hallucination: No Depressed Mood: Yes Insomnia: No Hypersomnia: No Altered Concentration: No Feels Worthless: No Grandiose Ideas: No Belief In Special Powers:  No New/Increased Substance Abuse: No Compulsions: No  Neurologic: Headache: Yes Seizure: No Paresthesias: No  Medical History;  Patient has hypertension and hypothyroidism.  She see Dr. Pollyann Kennedy at [facility].  She denies any history of traumatic brain injury, seizures or any concussion.  Psychosocial History: Patient was born and raised in [place]. Her parents never married. She adopted at 24-week-old child at age 17 . The patient told she was visiting her aunt and her neighbor left the child with a promise that she would be back if she can do babysitting. Patient told she never come back and since then she is taking care of this child. She's formally adopted this child soon after her daughter born. Patient has another son who is 72 years old. Patient married once but regrets her marriage. She got divorced in 2009. She did not provide much detail about her marriage. She has one brother who is in a Hotel manager and another brother who lives with the patient. The patient has a difficult relationship with her mother. Patient has some support from her ex-husband.   Family history The patient endorsed mother has depression and on has bipolar disorder.  Alcohol and substance use history. Patient denies any history of alcohol or illegal substance use.  Education and work history. Patient has a GED and currently working at [employer] for past 8 years.   Outpatient Encounter Prescriptions as of 11/03/2012  Medication Sig Dispense Refill  . levothyroxine (SYNTHROID, LEVOTHROID) 100 MCG tablet Take 50 mcg by mouth daily before breakfast.       . LORazepam (ATIVAN) 0.5  MG tablet Take 1 tablet (0.5 mg total) by mouth daily as needed for anxiety.  30 tablet  0  . omeprazole (PRILOSEC) 20 MG capsule       . ondansetron (ZOFRAN) 4 MG tablet       . [DISCONTINUED] LORazepam (ATIVAN) 0.5 MG tablet Take 1 tablet (0.5 mg total) by mouth daily as needed for anxiety.  30 tablet  0  .  lamoTRIgine (LAMICTAL) 25 MG tablet Take 1 tab daily for 2 week and than 2 tab daily  45 tablet  0   No facility-administered encounter medications on file as of 11/03/2012.    No results found for this or any previous visit (from the past 2160 hour(s)).  Past Psychiatric History/Hospitalization(s) Patient denies any TB as history of psychiatric inpatient treatment or any suicidal attempt she has a history of mania, psychosis or any hallucination.  She was never seen a psychiatrist until her primary care physician referred to [place].  She visited primary care physician and appears very anxious and depressed , she completed a depression screening and she was referred to see psychiatrist .  She was given prescription of Prozac which she never tried.  We have tried Zoloft 25 mg however she stopped because of nausea and persistent vomiting.  Patient endorsed history of severe anxiety and chest pain due to panic attack.   Anxiety: Yes Bipolar Disorder: No Depression: Yes Mania: No Psychosis: No Schizophrenia: No Personality Disorder: No Hospitalization for psychiatric illness: No History of Electroconvulsive Shock Therapy: No Prior Suicide Attempts: No  Physical Exam: Constitutional:  BP 134/92  Pulse 83  Ht 5' 4.5" (1.638 m)  Wt 127 lb 6.4 oz (57.788 kg)  BMI 21.54 kg/m2  LMP 10/28/2012  Musculoskeletal: Strength & Muscle Tone: within normal limits Gait & Station: normal Patient leans: N/A  Mental Status Examination;  Patient is a young female who appears thin and casually dressed.  She appears anxious and tremulous.  She is tearful.  Her speech is soft however clear and coherent.  Her thought process is slow but logical and goal directed.  She described her mood as tired and her affect is mood is constricted.  She denies any active or passive suicidal thoughts or homicidal thoughts.  She denies any auditory or visual hallucination.  Her fund of knowledge is average.  There is no paranoia  or delusions present at this time.  She's alert and oriented x3.  Her insight judgment and impulse control is okay.   Medical Decision Making (Choose Three): Review of Psycho-Social Stressors (1), Review and summation of old records (2), Established Problem, Worsening (2), Review of Last Therapy Session (1), Review of Medication Regimen & Side Effects (2) and Review of New Medication or Change in Dosage (2)  Assessment: Axis I: Depressive disorder NOS, anxiety disorder NOS  Axis II: Deferred  Axis III:  Past Medical History  Diagnosis Date  . Thyroid disease   . HTN (hypertension)     Axis IV: Moderate   Plan:  I recommend to continue Ativan half tablet as needed for severe anxiety and panic attack.  We will provide a new prescription.  I strongly recommend to try Lamictal since patient continues to have irritability depression and anger.  In the past she had tried multiple SSRIs with GI symptoms.  She tried Remeron however she was sleeping too much.  Patient remains out of her work until the end of this month as recommended by Dr. Pollyann Kennedy .  I explained  the side effects of medication especially rash with Lamictal .  Recommend to call us back if she has any question or any concern.  Recommend to see Victorino Dike for coping and social skills.  Followup in 4 weeks.  I will defer any intensive outpatient program since patient is not ready at this time .  We will consider on our next appointment.  Time spent 25 minutes.  More than 50% of the time spent in psychoeducation, counseling and coordination of care.  Discuss safety plan that anytime having active suicidal thoughts or homicidal thoughts then patient need to call 911 or go to the local emergency room.   ARFEEN,SYED T., MD 11/03/2012

## 2012-11-04 ENCOUNTER — Ambulatory Visit (HOSPITAL_COMMUNITY)
Admission: RE | Admit: 2012-11-04 | Discharge: 2012-11-04 | Disposition: A | Discharge status: Home / Self Care | Payer: 59 | Source: Ambulatory Visit | Attending: Obstetrics & Gynecology | Admitting: Obstetrics & Gynecology

## 2012-11-04 ENCOUNTER — Other Ambulatory Visit (HOSPITAL_COMMUNITY): Payer: 59

## 2012-11-04 ENCOUNTER — Telehealth (HOSPITAL_COMMUNITY): Payer: Self-pay

## 2012-11-04 DIAGNOSIS — R102 Pelvic and perineal pain: Secondary | ICD-10-CM

## 2012-11-04 DIAGNOSIS — Z3049 Encounter for surveillance of other contraceptives: Secondary | ICD-10-CM | POA: Insufficient documentation

## 2012-11-04 DIAGNOSIS — N949 Unspecified condition associated with female genital organs and menstrual cycle: Secondary | ICD-10-CM | POA: Insufficient documentation

## 2012-11-04 DIAGNOSIS — N938 Other specified abnormal uterine and vaginal bleeding: Secondary | ICD-10-CM | POA: Insufficient documentation

## 2012-11-04 NOTE — Telephone Encounter (Signed)
4:23PM 11/04/12 rECD CALLED [facility] [employer] (NURSE CASE MGR) DISABLITILY PLAN # [phone] [id number] - NEED TO SPEAK WITH YOU ABOUT SOME NOTES - TO ADDRESS THE ABILITY TO GO BACK TO WORK. PLEASE CALL THE NURSE CASE MGR.Marland KitchenMarguerite Olea

## 2012-11-07 ENCOUNTER — Encounter: Payer: Self-pay | Admitting: Obstetrics & Gynecology

## 2012-11-07 ENCOUNTER — Ambulatory Visit (HOSPITAL_COMMUNITY): Payer: Self-pay

## 2012-11-07 ENCOUNTER — Telehealth (HOSPITAL_COMMUNITY): Payer: Self-pay | Admitting: Psychiatry

## 2012-11-07 ENCOUNTER — Other Ambulatory Visit (HOSPITAL_COMMUNITY): Payer: 59

## 2012-11-07 ENCOUNTER — Ambulatory Visit (INDEPENDENT_AMBULATORY_CARE_PROVIDER_SITE_OTHER): Payer: 59 | Admitting: Obstetrics & Gynecology

## 2012-11-07 VITALS — BP 118/82 | HR 86 | Ht 64.0 in | Wt 132.8 lb

## 2012-11-07 DIAGNOSIS — N949 Unspecified condition associated with female genital organs and menstrual cycle: Secondary | ICD-10-CM

## 2012-11-07 DIAGNOSIS — N938 Other specified abnormal uterine and vaginal bleeding: Secondary | ICD-10-CM

## 2012-11-07 DIAGNOSIS — R102 Pelvic and perineal pain: Secondary | ICD-10-CM

## 2012-11-07 NOTE — Telephone Encounter (Signed)
I returned patient's short-term disability coordinator phone call.  Her name is Marylene Land.  Patient has filed for short-term disability and her OB/GYN reported symptoms of  depression and her physical condition on disability paper.  Patient continues to have depressive symptoms and I agreed that patient is not ready to go back work until her depression gets better.  We will discuss further on her next followup appointment.

## 2012-11-07 NOTE — Progress Notes (Signed)
  Subjective:    Patient ID: Diana Francis, female    DOB: 02-Nov-1980, 32 y.o.   MRN: [id number]  HPI  She is here today for the results of her ultrasound. It appeared normal.  Review of Systems     Objective:   Physical Exam        Assessment & Plan:  With regard to her DUB, she would like a d&c, but she declines an endometrial ablation. With regard to her pelvic pain, she would like a laparoscopy. I sent Diana Francis an email asking her to schedule this. I also explained to Diana Francis that if she would like a second opinion, then I would be happy to make a referral.

## 2012-11-07 NOTE — Patient Instructions (Signed)
Diagnostic Laparoscopy Laparoscopy is a surgical procedure. It is used to diagnose and treat diseases inside the belly(abdomen). It is usually a brief, common, and relatively simple procedure. The laparoscopeis a thin, lighted, pencil-sized instrument. It is like a telescope. It is inserted into your abdomen through a small cut (incision). Your caregiver can look at the organs inside your body through this instrument. He or she can see if there is anything abnormal. Laparoscopy can be done either in a hospital or outpatient clinic. You may be given a mild sedative to help you relax before the procedure. Once in the operating room, you will be given a drug to make you sleep (general anesthesia). Laparoscopy usually lasts less than 1 hour. After the procedure, you will be monitored in a recovery area until you are stable and doing well. Once you are home, it will take 2 to 3 days to fully recover. RISKS AND COMPLICATIONS  Laparoscopy has relatively few risks. Your caregiver will discuss the risks with you before the procedure. Some problems that can occur include:  Infection.  Bleeding.  Damage to other organs.  Anesthetic side effects. PROCEDURE Once you receive anesthesia, your surgeon inflates the abdomen with a harmless gas (carbon dioxide). This makes the organs easier to see. The laparoscope is inserted into the abdomen through a small incision. This allows your surgeon to see into the abdomen. Other small instruments are also inserted into the abdomen through other small openings. Many surgeons attach a video camera to the laparoscope to enlarge the view. During a diagnostic laparoscopy, the surgeon may be looking for inflammation, infection, or cancer. Your surgeon may take tissue samples(biopsies). The samples are sent to a specialist in looking at cells and tissue samples (pathologist). The pathologist examines them under a microscope. Biopsies can help to diagnose or confirm a  disease. AFTER THE PROCEDURE   The gas is released from inside the abdomen.  The incisions are closed with stitches (sutures). Because these incisions are small (usually less than 1/2 inch), there is usually minimal discomfort after the procedure. There may be some mild discomfort in the throat. This is from the tube placed in the throat while you were sleeping. You may have some mild abdominal discomfort. There may also be discomfort from the instrument placement incisions in the abdomen.  The recovery time is shortened as long as there are no complications.  You will rest in a recovery room until stable and doing well. As long as there are no complications, you may be allowed to go home. FINDING OUT THE RESULTS OF YOUR TEST Not all test results are available during your visit. If your test results are not back during the visit, make an appointment with your caregiver to find out the results. Do not assume everything is normal if you have not heard from your caregiver or the medical facility. It is important for you to follow up on all of your test results. HOME CARE INSTRUCTIONS   Take all medicines as directed.  Only take over-the-counter or prescription medicines for pain, discomfort, or fever as directed by your caregiver.  Resume daily activities as directed.  Showers are preferred over baths.  You may resume sexual activities in 1 week or as directed.  Do not drive while taking narcotics. SEEK MEDICAL CARE IF:   There is increasing abdominal pain.  There is new pain in the shoulders (shoulder strap areas).  You feel lightheaded or faint.  You have the chills.  You or your   child has an oral temperature above 102 F (38.9 C).  There is pus-like (purulent) drainage from any of the wounds.  You are unable to pass gas or have a bowel movement.  You feel sick to your stomach (nauseous) or throw up (vomit). MAKE SURE YOU:   Understand these instructions.  Will watch  your condition.  Will get help right away if you are not doing well or get worse. Document Released: 04/20/2000 Document Revised: 04/06/2011 Document Reviewed: 01/12/2007 [employer] Patient Information 2014 [employer].  

## 2012-11-08 ENCOUNTER — Other Ambulatory Visit (HOSPITAL_COMMUNITY): Payer: 59

## 2012-11-09 ENCOUNTER — Encounter (HOSPITAL_COMMUNITY): Payer: Self-pay | Admitting: Psychiatry

## 2012-11-09 ENCOUNTER — Other Ambulatory Visit (HOSPITAL_COMMUNITY): Payer: 59

## 2012-11-09 ENCOUNTER — Ambulatory Visit (INDEPENDENT_AMBULATORY_CARE_PROVIDER_SITE_OTHER): Payer: 59 | Admitting: Psychiatry

## 2012-11-09 DIAGNOSIS — F329 Major depressive disorder, single episode, unspecified: Secondary | ICD-10-CM

## 2012-11-09 NOTE — Progress Notes (Signed)
Patient ID: ZAKARI COUCHMAN, female   DOB: 31-Oct-1980, 32 y.o.   MRN: [id number]  Session Time: 12:30-1:20  Participation Level: Active   Behavioral Response: CasualAlertEuthymic  Type of Therapy: Individual Therapy   Treatment Goals addressed: emotion regulation, stress management   Interventions: CBT   Summary: DAVANEE KLINKNER is a 32 y.o. female who presents with depression and anxiety.   Suicidal/Homicidal: nowithout intent/plan   Therapist Response: Pt. Presented with positive mood. Pt. Reported encouragement regarding developments in relationship with mother who is demonstrating support and understanding of mental health diagnosis. Pt. Is very happy about possibility of developing a positive relationship with her mother and hopeful that an understanding about her diagnosis will help her to become more self-aware and proactive about healthy behaviors and asking for the support that she needs. Pt. Discussed history of behaviors and concerns about possible bipolar diagnosis. Plan: Return again in 2 weeks.   Diagnosis: Axis I: Depressive Disorder NOS   Axis II: deferred  Wynonia Musty  11/09/2012

## 2012-11-10 ENCOUNTER — Other Ambulatory Visit (HOSPITAL_COMMUNITY): Payer: 59

## 2012-11-11 ENCOUNTER — Other Ambulatory Visit (HOSPITAL_COMMUNITY): Payer: 59

## 2012-11-14 ENCOUNTER — Other Ambulatory Visit (HOSPITAL_COMMUNITY): Payer: 59

## 2012-11-15 ENCOUNTER — Encounter (HOSPITAL_COMMUNITY): Payer: Self-pay | Admitting: Pharmacist

## 2012-11-15 ENCOUNTER — Other Ambulatory Visit (HOSPITAL_COMMUNITY): Payer: 59

## 2012-11-16 ENCOUNTER — Other Ambulatory Visit (HOSPITAL_COMMUNITY): Payer: 59

## 2012-11-17 ENCOUNTER — Encounter (HOSPITAL_COMMUNITY): Payer: Self-pay

## 2012-11-17 ENCOUNTER — Encounter (HOSPITAL_COMMUNITY)
Admission: RE | Admit: 2012-11-17 | Discharge: 2012-11-17 | Disposition: A | Discharge status: Home / Self Care | Payer: 59 | Source: Ambulatory Visit | Attending: Obstetrics & Gynecology | Admitting: Obstetrics & Gynecology

## 2012-11-17 DIAGNOSIS — Z01818 Encounter for other preprocedural examination: Secondary | ICD-10-CM | POA: Insufficient documentation

## 2012-11-17 DIAGNOSIS — Z01812 Encounter for preprocedural laboratory examination: Secondary | ICD-10-CM | POA: Insufficient documentation

## 2012-11-17 HISTORY — DX: Gastro-esophageal reflux disease without esophagitis: K21.9

## 2012-11-17 HISTORY — DX: Major depressive disorder, single episode, unspecified: F32.9

## 2012-11-17 HISTORY — DX: Depression, unspecified: F32.A

## 2012-11-17 HISTORY — DX: Hypothyroidism, unspecified: E03.9

## 2012-11-17 HISTORY — DX: Anemia, unspecified: D64.9

## 2012-11-17 HISTORY — DX: Headache: R51

## 2012-11-17 LAB — CBC
Platelets: 252 10*3/uL (ref 150–400)
RBC: 4.15 MIL/uL (ref 3.87–5.11)
RDW: 13.9 % (ref 11.5–15.5)
WBC: 8.2 10*3/uL (ref 4.0–10.5)

## 2012-11-17 NOTE — Patient Instructions (Signed)
Your procedure is scheduled on:11/24/12  Enter through the Main Entrance at :6am Pick up desk phone and dial [phone] and inform us of your arrival.  Please call [phone] if you have any problems the morning of surgery.  Remember: Do not eat food or drink liquids, including water, after midnight: WED.    You may brush your teeth the morning of surgery.  Take these meds the morning of surgery with a sip of water: thyroid med  DO NOT wear jewelry, eye make-up, lipstick,body lotion, or dark fingernail polish.  (Polished toes are ok) You may wear deodorant.  If you are to be admitted after surgery, leave suitcase in car until your room has been assigned. Patients discharged on the day of surgery will not be allowed to drive home. Wear loose fitting, comfortable clothes for your ride home.

## 2012-11-18 ENCOUNTER — Encounter: Payer: Self-pay | Admitting: Obstetrics & Gynecology

## 2012-11-18 ENCOUNTER — Ambulatory Visit (INDEPENDENT_AMBULATORY_CARE_PROVIDER_SITE_OTHER): Payer: 59 | Admitting: Obstetrics & Gynecology

## 2012-11-18 ENCOUNTER — Ambulatory Visit: Payer: Self-pay | Admitting: Obstetrics & Gynecology

## 2012-11-18 VITALS — BP 128/108 | HR 93 | Ht 64.0 in | Wt 123.0 lb

## 2012-11-18 DIAGNOSIS — N949 Unspecified condition associated with female genital organs and menstrual cycle: Secondary | ICD-10-CM

## 2012-11-18 DIAGNOSIS — N938 Other specified abnormal uterine and vaginal bleeding: Secondary | ICD-10-CM

## 2012-11-18 DIAGNOSIS — R102 Pelvic and perineal pain: Secondary | ICD-10-CM

## 2012-11-18 NOTE — Progress Notes (Signed)
  Subjective:    Patient ID: Diana Francis, female    DOB: [birth year], 32 y.o.   MRN: [id number]  HPI  She is here today with her grandmother today with questions about surgery. She also reports that she has skin that reacts to nickel jewelery. Upon review of the [facility] literature, she pointed out that nickel sensitivity as proven by a skin test, can cause problems.   Review of Systems     Objective:   Physical Exam        Assessment & Plan:  DUB and Pelvic pain. Her d&c and diagnostic laparoscopy are scheduled for 11-24-12. I will get her referred to a dermatologist for skin testing.

## 2012-11-23 ENCOUNTER — Ambulatory Visit (INDEPENDENT_AMBULATORY_CARE_PROVIDER_SITE_OTHER): Payer: 59 | Admitting: Psychiatry

## 2012-11-23 ENCOUNTER — Encounter (HOSPITAL_COMMUNITY): Payer: Self-pay | Admitting: Psychiatry

## 2012-11-23 DIAGNOSIS — F4310 Post-traumatic stress disorder, unspecified: Secondary | ICD-10-CM

## 2012-11-23 DIAGNOSIS — F329 Major depressive disorder, single episode, unspecified: Secondary | ICD-10-CM

## 2012-11-23 DIAGNOSIS — N938 Other specified abnormal uterine and vaginal bleeding: Secondary | ICD-10-CM

## 2012-11-23 MED ORDER — DOXYCYCLINE HYCLATE 100 MG IV SOLR
200.0000 mg | INTRAVENOUS | Status: AC
  Administered 2012-11-24: 200 mg via INTRAVENOUS
  Filled 2012-11-23: qty 200
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
## 2012-11-24 ENCOUNTER — Ambulatory Visit (HOSPITAL_COMMUNITY)
Admission: RE | Admit: 2012-11-24 | Discharge: 2012-11-24 | Disposition: A | Discharge status: Home / Self Care | Payer: 59 | Source: Ambulatory Visit | Attending: Obstetrics & Gynecology | Admitting: Obstetrics & Gynecology

## 2012-11-24 ENCOUNTER — Encounter (HOSPITAL_COMMUNITY): Payer: 59 | Admitting: Anesthesiology

## 2012-11-24 ENCOUNTER — Ambulatory Visit (HOSPITAL_COMMUNITY): Payer: 59 | Admitting: Anesthesiology

## 2012-11-24 ENCOUNTER — Encounter (HOSPITAL_COMMUNITY)
Admission: RE | Disposition: A | Discharge status: Home / Self Care | Payer: Self-pay | Source: Ambulatory Visit | Attending: Obstetrics & Gynecology

## 2012-11-24 ENCOUNTER — Encounter (HOSPITAL_COMMUNITY): Payer: Self-pay | Admitting: *Deleted

## 2012-11-24 DIAGNOSIS — N938 Other specified abnormal uterine and vaginal bleeding: Secondary | ICD-10-CM | POA: Insufficient documentation

## 2012-11-24 DIAGNOSIS — R102 Pelvic and perineal pain: Secondary | ICD-10-CM

## 2012-11-24 DIAGNOSIS — N949 Unspecified condition associated with female genital organs and menstrual cycle: Secondary | ICD-10-CM | POA: Insufficient documentation

## 2012-11-24 HISTORY — PX: DILATION AND CURETTAGE OF UTERUS: SHX78

## 2012-11-24 HISTORY — PX: LAPAROSCOPY: SHX197

## 2012-11-24 SURGERY — LAPAROSCOPY, DIAGNOSTIC
Anesthesia: General | Site: Uterus | Wound class: Clean

## 2012-11-24 MED ORDER — MIDAZOLAM HCL 2 MG/2ML IJ SOLN
INTRAMUSCULAR | Status: AC
  Filled 2012-11-24: qty 2

## 2012-11-24 MED ORDER — BUPIVACAINE HCL (PF) 0.5 % IJ SOLN
INTRAMUSCULAR | Status: AC
  Filled 2012-11-24: qty 30

## 2012-11-24 MED ORDER — OXYCODONE-ACETAMINOPHEN 5-325 MG PO TABS: 1.0000 | ORAL_TABLET | ORAL | Status: DC | PRN

## 2012-11-24 MED ORDER — LACTATED RINGERS IV SOLN
INTRAVENOUS | Status: DC
  Administered 2012-11-24 (×2): via INTRAVENOUS

## 2012-11-24 MED ORDER — BUPIVACAINE HCL (PF) 0.5 % IJ SOLN
INTRAMUSCULAR | Status: DC | PRN
  Administered 2012-11-24: 8 mL
  Administered 2012-11-24: 10 mL

## 2012-11-24 MED ORDER — ROCURONIUM BROMIDE 100 MG/10ML IV SOLN
INTRAVENOUS | Status: DC | PRN
  Administered 2012-11-24: 5 mg via INTRAVENOUS
  Administered 2012-11-24: 25 mg via INTRAVENOUS

## 2012-11-24 MED ORDER — KETOROLAC TROMETHAMINE 30 MG/ML IJ SOLN
INTRAMUSCULAR | Status: DC | PRN
  Administered 2012-11-24: 30 mg via INTRAVENOUS

## 2012-11-24 MED ORDER — GLYCOPYRROLATE 0.2 MG/ML IJ SOLN
INTRAMUSCULAR | Status: DC | PRN
  Administered 2012-11-24: 0.6 mg via INTRAVENOUS

## 2012-11-24 MED ORDER — PROPOFOL 10 MG/ML IV BOLUS
INTRAVENOUS | Status: DC | PRN
  Administered 2012-11-24: 150 mg via INTRAVENOUS

## 2012-11-24 MED ORDER — OXYCODONE-ACETAMINOPHEN 5-325 MG PO TABS
1.0000 | ORAL_TABLET | ORAL | Status: DC | PRN
  Administered 2012-11-24: 1 via ORAL

## 2012-11-24 MED ORDER — 0.9 % SODIUM CHLORIDE (POUR BTL) OPTIME
TOPICAL | Status: DC | PRN
  Administered 2012-11-24: 1000 mL

## 2012-11-24 MED ORDER — FENTANYL CITRATE 0.05 MG/ML IJ SOLN: 25.0000 ug | INTRAMUSCULAR | Status: DC | PRN

## 2012-11-24 MED ORDER — CEFAZOLIN SODIUM-DEXTROSE 2-3 GM-% IV SOLR
2.0000 g | INTRAVENOUS | Status: AC
  Administered 2012-11-24: 2 g via INTRAVENOUS

## 2012-11-24 MED ORDER — LIDOCAINE HCL (CARDIAC) 20 MG/ML IV SOLN
INTRAVENOUS | Status: AC
  Filled 2012-11-24: qty 5

## 2012-11-24 MED ORDER — FENTANYL CITRATE 0.05 MG/ML IJ SOLN
INTRAMUSCULAR | Status: AC
  Filled 2012-11-24: qty 2

## 2012-11-24 MED ORDER — ROCURONIUM BROMIDE 100 MG/10ML IV SOLN
INTRAVENOUS | Status: AC
  Filled 2012-11-24: qty 1

## 2012-11-24 MED ORDER — DEXAMETHASONE SODIUM PHOSPHATE 10 MG/ML IJ SOLN
INTRAMUSCULAR | Status: AC
  Filled 2012-11-24: qty 1

## 2012-11-24 MED ORDER — FENTANYL CITRATE 0.05 MG/ML IJ SOLN
INTRAMUSCULAR | Status: DC | PRN
  Administered 2012-11-24 (×2): 50 ug via INTRAVENOUS

## 2012-11-24 MED ORDER — NEOSTIGMINE METHYLSULFATE 1 MG/ML IJ SOLN
INTRAMUSCULAR | Status: DC | PRN
  Administered 2012-11-24: 4 mg via INTRAVENOUS

## 2012-11-24 MED ORDER — LIDOCAINE HCL (CARDIAC) 20 MG/ML IV SOLN
INTRAVENOUS | Status: DC | PRN
  Administered 2012-11-24: 20 mg via INTRAVENOUS
  Administered 2012-11-24: 50 mg via INTRAVENOUS

## 2012-11-24 MED ORDER — GLYCOPYRROLATE 0.2 MG/ML IJ SOLN
INTRAMUSCULAR | Status: AC
  Filled 2012-11-24: qty 3

## 2012-11-24 MED ORDER — KETOROLAC TROMETHAMINE 30 MG/ML IJ SOLN
INTRAMUSCULAR | Status: AC
  Filled 2012-11-24: qty 1

## 2012-11-24 MED ORDER — IBUPROFEN 800 MG PO TABS: 800.0000 mg | ORAL_TABLET | Freq: Three times a day (TID) | ORAL | Status: DC | PRN

## 2012-11-24 MED ORDER — DEXAMETHASONE SODIUM PHOSPHATE 10 MG/ML IJ SOLN
INTRAMUSCULAR | Status: DC | PRN
  Administered 2012-11-24: 10 mg via INTRAVENOUS

## 2012-11-24 MED ORDER — PROPOFOL 10 MG/ML IV EMUL
INTRAVENOUS | Status: AC
  Filled 2012-11-24: qty 20

## 2012-11-24 MED ORDER — MIDAZOLAM HCL 2 MG/2ML IJ SOLN
INTRAMUSCULAR | Status: DC | PRN
  Administered 2012-11-24: 1 mg via INTRAVENOUS

## 2012-11-24 MED ORDER — ONDANSETRON HCL 4 MG/2ML IJ SOLN
INTRAMUSCULAR | Status: AC
  Filled 2012-11-24: qty 2

## 2012-11-24 MED ORDER — NEOSTIGMINE METHYLSULFATE 1 MG/ML IJ SOLN
INTRAMUSCULAR | Status: AC
  Filled 2012-11-24: qty 1

## 2012-11-24 MED ORDER — CEFAZOLIN SODIUM-DEXTROSE 2-3 GM-% IV SOLR
INTRAVENOUS | Status: AC
  Filled 2012-11-24: qty 50

## 2012-11-24 MED ORDER — OXYCODONE-ACETAMINOPHEN 5-325 MG PO TABS
ORAL_TABLET | ORAL | Status: AC
  Filled 2012-11-24: qty 1

## 2012-11-24 MED ORDER — ONDANSETRON HCL 4 MG/2ML IJ SOLN
INTRAMUSCULAR | Status: DC | PRN
  Administered 2012-11-24: 4 mg via INTRAVENOUS

## 2012-11-24 SURGICAL SUPPLY — 38 items
APPLICATOR COTTON TIP 6IN STRL (MISCELLANEOUS) IMPLANT
BANDAGE ADHESIVE 1X3 (GAUZE/BANDAGES/DRESSINGS) ×6 IMPLANT
CABLE HIGH FREQUENCY MONO STRZ (ELECTRODE) IMPLANT
CATH ROBINSON RED A/P 16FR (CATHETERS) ×3 IMPLANT
CLOTH BEACON ORANGE TIMEOUT ST (SAFETY) ×3 IMPLANT
CONTAINER PREFILL 10% NBF 60ML (FORM) ×6 IMPLANT
DILATOR CANAL MILEX (MISCELLANEOUS) IMPLANT
DRESSING TELFA 8X3 (GAUZE/BANDAGES/DRESSINGS) IMPLANT
DURAPREP 26ML APPLICATOR (WOUND CARE) ×3 IMPLANT
ELECT REM PT RETURN 9FT ADLT (ELECTROSURGICAL) ×3
ELECTRODE REM PT RTRN 9FT ADLT (ELECTROSURGICAL) ×2 IMPLANT
GAUZE SPONGE 4X4 16PLY XRAY LF (GAUZE/BANDAGES/DRESSINGS) ×3 IMPLANT
GLOVE BIO SURGEON STRL SZ 6.5 (GLOVE) ×15 IMPLANT
GLOVE SURG SS PI 7.0 STRL IVOR (GLOVE) ×3 IMPLANT
GOWN PREVENTION PLUS LG XLONG (DISPOSABLE) ×6 IMPLANT
GOWN STRL REIN XL XLG (GOWN DISPOSABLE) ×6 IMPLANT
NDL SAFETY ECLIPSE 18X1.5 (NEEDLE) IMPLANT
NEEDLE HYPO 18GX1.5 SHARP (NEEDLE)
NEEDLE INSUFFLATION 120MM (ENDOMECHANICALS) ×3 IMPLANT
NEEDLE SPNL 18GX3.5 QUINCKE PK (NEEDLE) ×3 IMPLANT
NS IRRIG 1000ML POUR BTL (IV SOLUTION) ×3 IMPLANT
PACK LAPAROSCOPY BASIN (CUSTOM PROCEDURE TRAY) ×3 IMPLANT
PACK VAGINAL MINOR WOMEN LF (CUSTOM PROCEDURE TRAY) IMPLANT
PAD OB MATERNITY 4.3X12.25 (PERSONAL CARE ITEMS) ×3 IMPLANT
PAD PREP 24X48 CUFFED NSTRL (MISCELLANEOUS) ×3 IMPLANT
POUCH SPECIMEN RETRIEVAL 10MM (ENDOMECHANICALS) IMPLANT
PROTECTOR NERVE ULNAR (MISCELLANEOUS) ×6 IMPLANT
SET IRRIG TUBING LAPAROSCOPIC (IRRIGATION / IRRIGATOR) IMPLANT
STRIP CLOSURE SKIN 1/2X4 (GAUZE/BANDAGES/DRESSINGS) ×3 IMPLANT
SUT VICRYL 0 ENDOLOOP (SUTURE) IMPLANT
SUT VICRYL 0 UR6 27IN ABS (SUTURE) ×3 IMPLANT
SUT VICRYL 4-0 PS2 18IN ABS (SUTURE) ×3 IMPLANT
SYR CONTROL 10ML LL (SYRINGE) ×3 IMPLANT
TOWEL OR 17X24 6PK STRL BLUE (TOWEL DISPOSABLE) ×6 IMPLANT
TROCAR XCEL NON-BLD 11X100MML (ENDOMECHANICALS) ×3 IMPLANT
TROCAR XCEL NON-BLD 5MMX100MML (ENDOMECHANICALS) ×3 IMPLANT
WARMER LAPAROSCOPE (MISCELLANEOUS) ×3 IMPLANT
WATER STERILE IRR 1000ML POUR (IV SOLUTION) IMPLANT

## 2012-11-24 NOTE — Transfer of Care (Signed)
Immediate Anesthesia Transfer of Care Note  Patient: Diana Francis  Procedure(s) Performed: Procedure(s): LAPAROSCOPY DIAGNOSTIC (N/A) DILATATION AND CURETTAGE (N/A)  Patient Location: PACU  Anesthesia Type:General  Level of Consciousness: awake, sedated and patient cooperative  Airway & Oxygen Therapy: Patient Spontanous Breathing and Patient connected to nasal cannula oxygen  Post-op Assessment: Report given to PACU RN and Post -op Vital signs reviewed and stable  Post vital signs: Reviewed and stable  Complications: No apparent anesthesia complications

## 2012-11-24 NOTE — Anesthesia Preprocedure Evaluation (Addendum)
Anesthesia Evaluation  Patient identified by MRN, date of birth, ID band Patient awake    Reviewed: Allergy & Precautions, H&P , Patient's Chart, lab work & pertinent test results, reviewed documented beta blocker date and time   Airway Mallampati: II TM Distance: >3 FB Neck ROM: full    Dental no notable dental hx. (+) Poor Dentition   Pulmonary  breath sounds clear to auscultation  Pulmonary exam normal       Cardiovascular Rhythm:regular Rate:Normal     Neuro/Psych    GI/Hepatic GERD-  Medicated and Controlled,  Endo/Other    Renal/GU      Musculoskeletal   Abdominal   Peds  Hematology   Anesthesia Other Findings   Reproductive/Obstetrics                          Anesthesia Physical Anesthesia Plan  ASA: II  Anesthesia Plan: General   Post-op Pain Management:    Induction: Intravenous  Airway Management Planned: Oral ETT  Additional Equipment:   Intra-op Plan:   Post-operative Plan: Extubation in OR  Informed Consent: I have reviewed the patients History and Physical, chart, labs and discussed the procedure including the risks, benefits and alternatives for the proposed anesthesia with the patient or authorized representative who has indicated his/her understanding and acceptance.   Dental Advisory Given and Dental advisory given  Plan Discussed with: CRNA and Surgeon  Anesthesia Plan Comments: (  Discussed general anesthesia, including possible nausea, instrumentation of airway, sore throat,pulmonary aspiration, etc. I asked if the were any outstanding questions, or  concerns before we proceeded. )        Anesthesia Quick Evaluation

## 2012-11-24 NOTE — H&P (Addendum)
Diana Francis is an 32 y.o. female.  She is here today for a d&c due to irregular bleeding for the last 5 months, definitely not regular cycles, sometimes 2 periods per month. She also is here for a diagnostic laparoscopy. She started having pelvic pain, not related to period, radiation to lower back, side and front of thighs. Some dyspareunia, but no sex since July.  Pertinent Gynecological History: Menses: irregular occurring approximately every 10 days with spotting approximately 10 days per month Bleeding: dysfunctional uterine bleeding Contraception: none DES exposure: denies Blood transfusions: none Sexually transmitted diseases: no past history Previous GYN Procedures: LEEP   Last mammogram: n/a  Last pap: normal Date: 2014 OB History: G3, P2 A1 (10 and 5 + adopted a 32 yo)  Menstrual History: Menarche age: 63 Patient's last menstrual period was 11/20/2012.    Past Medical History  Diagnosis Date  . Thyroid disease   . Depression   . Hypothyroidism   . GERD (gastroesophageal reflux disease)   . Headache(784.0)   . HTN (hypertension)     no meds- per pt white coat syndrome  . Anemia     history of anemia    Past Surgical History  Procedure Laterality Date  . Tubal ligation      Family History  Problem Relation Age of Onset  . Depression Mother   . Bipolar disorder Maternal Aunt     Social History:  reports that she has been smoking Cigarettes.  She has been smoking about 0.50 packs per day. She has never used smokeless tobacco. She reports that she does not drink alcohol or use illicit drugs.  Allergies: No Known Allergies  Prescriptions prior to admission  Medication Sig Dispense Refill  . lamoTRIgine (LAMICTAL) 25 MG tablet Take 1 tab daily for 2 week and than 2 tab daily  45 tablet  0  . levothyroxine (SYNTHROID, LEVOTHROID) 100 MCG tablet Take 50 mcg by mouth daily before breakfast.       . LORazepam (ATIVAN) 0.5 MG tablet Take 1 tablet (0.5 mg total)  by mouth daily as needed for anxiety.  30 tablet  0  . omeprazole (PRILOSEC) 20 MG capsule Take 20 mg by mouth daily.       . ondansetron (ZOFRAN) 4 MG tablet Take 4 mg by mouth every 12 (twelve) hours as needed.         ROS  Blood pressure 129/89, pulse 81, temperature 98.2 F (36.8 C), temperature source Oral, resp. rate 18, last menstrual period 11/20/2012, SpO2 99.00%. Physical Exam Heart- rrr Lungs- CTAB Abd- benign  Results for orders placed during the hospital encounter of 11/24/12 (from the past 24 hour(s))  PREGNANCY, URINE     Status: None   Collection Time    11/24/12  6:45 AM      Result Value Range   Preg Test, Ur NEGATIVE  NEGATIVE    No results found.  Assessment/Plan: DUB- d&c Pelvic pain- diagnostic laparoscopy  Brando Taves C. 11/24/2012, 7:13 AM

## 2012-11-24 NOTE — Op Note (Signed)
11/24/2012  8:40 AM  PATIENT:  Diana Francis  32 y.o. female  PRE-OPERATIVE DIAGNOSIS:  Dysfunctional uterine bleeding, pelvic pain  POST-OPERATIVE DIAGNOSIS: same  PROCEDURE:  Procedure(s): LAPAROSCOPY DIAGNOSTIC (N/A) DILATATION AND CURETTAGE (N/A)  SURGEON:  Surgeon(s) and Role:    * Allie Bossier, MD - Primary  PHYSICIAN ASSISTANT:   ASSISTANTS: none   ANESTHESIA:   general  EBL:  Total I/O In: 1200 [I.V.:1200] Out: -   BLOOD ADMINISTERED:none  DRAINS: none   LOCAL MEDICATIONS USED:  MARCAINE     SPECIMEN:  Source of Specimen:  peritoneal biopsy from right uterosacral ligament  DISPOSITION OF SPECIMEN:  PATHOLOGY  COUNTS:  YES  TOURNIQUET:  * No tourniquets in log *  DICTATION: .Dragon Dictation  PLAN OF CARE: Discharge to home after PACU  PATIENT DISPOSITION:  PACU - hemodynamically stable.   Delay start of Pharmacological VTE agent (>24hrs) due to surgical blood loss or risk of bleeding: not applicable           The risks, benefits, and alternatives of surgery were explained, understood, and accepted. All questions were answered. Consents were signed. In the operating room, general anesthesia was applied without complication. She was placed in the dorsal lithotomy position and her vagina and abdomen was prepped and draped in the usual sterile fashion. A Chavana catheter was used to drain her bladder. A bimanual exam revealed a normal size and shape, anteverted mobile uterus. Her adnexa were nonenlarged. A speculum was placed and a single-tooth tenaculum was used to grasp the anterior lip of her nulliparous cervix. A total of 10 mL of 0.5% Marcaine was used to perform a paracervical block. Her uterus sounded to 8 cm. Her cervix was carefully and slowly and easily dilated to accommodate a small curet. A curettage was done in all quadrants and the fundus of the uterus. A small to moderate amount of tissue was obtained. There was no bleeding noted at the  end of the case. Gloves were changed and attention was turned to the abdomen. 8 mL of 0.5% marcaine was injected into the umbilicus. A vertical incision was made and a Veress needle was placed into the pelvis. Low flow CO2 was used to insuflate the abdomen to about 3 L. Patient abdominal pressure was less than 15.  Laparoscopy confirmed correct placement. The pelvis was inspected and the only possible abnormality was the right uterosacral ligament and the peritoneum over the right ureter where strawberry colored lesions were noted. I placed a 5 mm port in the left lower quadrant under direct laparoscopic visualization. I excised the entire red lesion on the right uterosacral ligament but left the peritoneum over the ureter alone. Hemostasis was noted at the biopsy site. The 5 mm port was removed. The CO2 was allowed to escape from the abdomen. The umbilical fascia was closed with a figure of 8 0 vicryl suture. The subcutaneous tissue at both sites was closed with 4-0 vicryl. She was taken to the recovery room after being extubated. She tolerated the procedure well.

## 2012-11-24 NOTE — Anesthesia Postprocedure Evaluation (Signed)
Anesthesia Post Note  Patient: Diana Francis  Procedure(s) Performed: Procedure(s) (LRB): LAPAROSCOPY DIAGNOSTIC (N/A) DILATATION AND CURETTAGE (N/A)  Anesthesia type: General  Patient location: PACU  Post pain: Pain level controlled  Post assessment: Post-op Vital signs reviewed  Last Vitals:  Filed Vitals:   11/24/12 0830  BP: 145/102  Pulse: 79  Temp:   Resp: 15    Post vital signs: Reviewed  Level of consciousness: sedated  Complications: No apparent anesthesia complications

## 2012-11-25 ENCOUNTER — Encounter (HOSPITAL_COMMUNITY): Payer: Self-pay | Admitting: Obstetrics & Gynecology

## 2012-12-01 ENCOUNTER — Encounter (HOSPITAL_COMMUNITY): Payer: Self-pay | Admitting: Psychiatry

## 2012-12-01 ENCOUNTER — Ambulatory Visit (INDEPENDENT_AMBULATORY_CARE_PROVIDER_SITE_OTHER): Payer: 59 | Admitting: Psychiatry

## 2012-12-01 VITALS — BP 132/78 | HR 80 | Ht 64.5 in | Wt 125.0 lb

## 2012-12-01 DIAGNOSIS — F3289 Other specified depressive episodes: Secondary | ICD-10-CM

## 2012-12-01 DIAGNOSIS — F411 Generalized anxiety disorder: Secondary | ICD-10-CM

## 2012-12-01 DIAGNOSIS — F329 Major depressive disorder, single episode, unspecified: Secondary | ICD-10-CM

## 2012-12-01 MED ORDER — LAMOTRIGINE 25 MG PO TABS: ORAL_TABLET | ORAL | Status: DC

## 2012-12-01 NOTE — Progress Notes (Signed)
[facility] [id number] Progress Note  Diana Francis [id number] 32 y.o.  12/01/2012 10:42 AM  Chief Complaint:  I like Lamictal.      History of Present Illness. Diana Francis came for her followup appointment.  She had surgery last week and she is still recovering from the surgery.  She has difficulty walking.  She appears tired and exhausted.  She admitted poor sleep because of the pain.  She was given multiple antibiotic however she is no longer taking any antibiotic.  She likes Lamictal, she feels is helping her mood and anxiety however she does notice itching on her cheek but no rash.  She is afraid that increase Lamictal may worsen the itching.  She continues to feel sadness and depression.  She has few crying spells.  She feels very anxious and having panic attack.  She is also complaining of physically tired because of the surgery.  She is hoping to restart intensive outpatient program next week.  She still feels not ready to go back work.  Patient denies any hallucination or any suicidal thinking.  She still has irritability mood swing and getting frustrated.  She is taking Ativan a few times for the panic attack.  She is taking Percocet and ibuprofen to control her pain.  Suicidal Ideation: No Plan Formed: No Patient has means to carry out plan: No  Homicidal Ideation: No Plan Formed: No Patient has means to carry out plan: No  Review of Systems: Psychiatric: Agitation: No Hallucination: No Depressed Mood: Yes Insomnia: No Hypersomnia: No Altered Concentration: No Feels Worthless: No Grandiose Ideas: No Belief In Special Powers: No New/Increased Substance Abuse: No Compulsions: No  Neurologic: Headache: Yes Seizure: No Paresthesias: No  Medical History;  Patient has hypertension and hypothyroidism.  She see Dr. Pollyann Kennedy at [facility].  She denies any history of traumatic brain injury, seizures or any concussion.  Psychosocial History: Patient was born  and raised in [place]. Her parents never married. She adopted at 21-week-old child at age 19 . The patient told she was visiting her aunt and her neighbor left the child with a promise that she would be back if she can do babysitting. Patient told she never come back and since then she is taking care of this child. She's formally adopted this child soon after her daughter born. Patient has another son who is 29 years old. Patient married once but regrets her marriage. She got divorced in 2009. She did not provide much detail about her marriage. She has one brother who is in a Hotel manager and another brother who lives with the patient. The patient has a difficult relationship with her mother. Patient has some support from her ex-husband.   Family history The patient endorsed mother has depression and on has bipolar disorder.  Alcohol and substance use history. Patient denies any history of alcohol or illegal substance use.  Education and work history. Patient has a GED and currently working at [employer] for past 8 years.   Outpatient Encounter Prescriptions as of 12/01/2012  Medication Sig  . ibuprofen (ADVIL,MOTRIN) 800 MG tablet Take 1 tablet (800 mg total) by mouth every 8 (eight) hours as needed for pain.  Marland Kitchen lamoTRIgine (LAMICTAL) 25 MG tablet Take 2 tab daily  . levothyroxine (SYNTHROID, LEVOTHROID) 100 MCG tablet Take 50 mcg by mouth daily before breakfast.   . LORazepam (ATIVAN) 0.5 MG tablet Take 1 tablet (0.5 mg total) by mouth daily as needed for anxiety.  Marland Kitchen omeprazole (  PRILOSEC) 20 MG capsule Take 20 mg by mouth daily.   Marland Kitchen oxyCODONE-acetaminophen (PERCOCET/ROXICET) 5-325 MG per tablet Take 1 tablet by mouth every 4 (four) hours as needed for pain.  . [DISCONTINUED] lamoTRIgine (LAMICTAL) 25 MG tablet Take 1 tab daily for 2 week and than 2 tab daily  . [DISCONTINUED] ondansetron (ZOFRAN) 4 MG tablet Take 4 mg by mouth every 12 (twelve) hours as needed.     Recent Results  (from the past 2160 hour(s))  CBC     Status: None   Collection Time    11/17/12 11:30 AM      Result Value Range   WBC 8.2  4.0 - 10.5 K/uL   RBC 4.15  3.87 - 5.11 MIL/uL   Hemoglobin 13.3  12.0 - 15.0 g/dL   HCT 24.4  01.0 - 27.2 %   MCV 90.1  78.0 - 100.0 fL   MCH 32.0  26.0 - 34.0 pg   MCHC 35.6  30.0 - 36.0 g/dL   RDW 53.6  64.4 - 03.4 %   Platelets 252  150 - 400 K/uL  PREGNANCY, URINE     Status: None   Collection Time    11/24/12  6:45 AM      Result Value Range   Preg Test, Ur NEGATIVE  NEGATIVE   Comment:            THE SENSITIVITY OF THIS     METHODOLOGY IS >20 mIU/mL.    Past Psychiatric History/Hospitalization(s) Patient denies any TB as history of psychiatric inpatient treatment or any suicidal attempt she has a history of mania, psychosis or any hallucination.  She was never seen a psychiatrist until her primary care physician referred to [place].  She visited primary care physician and appears very anxious and depressed , she completed a depression screening and she was referred to see psychiatrist .  She was given prescription of Prozac which she never tried.  We have tried Zoloft 25 mg however she stopped because of nausea and persistent vomiting.  Patient endorsed history of severe anxiety and chest pain due to panic attack.   Anxiety: Yes Bipolar Disorder: No Depression: Yes Mania: No Psychosis: No Schizophrenia: No Personality Disorder: No Hospitalization for psychiatric illness: No History of Electroconvulsive Shock Therapy: No Prior Suicide Attempts: No  Physical Exam: Constitutional:  BP 132/78  Pulse 80  Ht 5' 4.5" (1.638 m)  Wt 125 lb (56.7 kg)  BMI 21.13 kg/m2  LMP 11/20/2012  Musculoskeletal: Strength & Muscle Tone: within normal limits Gait & Station: normal Patient leans: N/A  Mental Status Examination;  Patient is a young female who appears thin and casually dressed.  She appears tired and exhausted .  She described her mood is anxious and  her affect is constricted.  Her speech is soft however clear and coherent.  Her thought process is slow but logical and goal directed.  She denies any active or passive suicidal thoughts or homicidal thoughts.  She denies any auditory or visual hallucination.  Her fund of knowledge is average.  There is no paranoia or delusions present at this time.  She's alert and oriented x3.  Her insight judgment and impulse control is okay.   Medical Decision Making (Choose Three): Established Problem, Stable/Improving (1), Review of Psycho-Social Stressors (1), Review or order clinical lab tests (1), Review of Last Therapy Session (1), Review of Medication Regimen & Side Effects (2) and Review of New Medication or Change in Dosage (2)  Assessment:  Axis I: Depressive disorder NOS, anxiety disorder NOS  Axis II: Deferred  Axis III:  Past Medical History  Diagnosis Date  . Thyroid disease   . Depression   . Hypothyroidism   . GERD (gastroesophageal reflux disease)   . Headache(784.0)   . HTN (hypertension)     no meds- per pt white coat syndrome  . Anemia     history of anemia    Axis IV: Moderate   Plan:  I review her recent blood test, discharge summary in current medication.  I will defer any increase in her Lamictal because of itching on her cheek .  However if her itching resolve we will consider increasing the Lamictal.  Recommend to continue Ativan for panic attack.  Patient will start intensive outpatient program next week .  At this time patient remains very anxious and nervous and she is not ready to go back to work.  Time spent 25 minutes.  More than 50% of the time spent in psychoeducation, counseling and coordination of care.  Discuss safety plan that anytime having active suicidal thoughts or homicidal thoughts then patient need to call 911 or go to the local emergency room.   ARFEEN,SYED T., MD 12/01/2012

## 2012-12-07 ENCOUNTER — Ambulatory Visit (HOSPITAL_COMMUNITY): Payer: Self-pay | Admitting: Psychiatry

## 2012-12-09 ENCOUNTER — Other Ambulatory Visit (HOSPITAL_COMMUNITY): Payer: 59 | Attending: Psychiatry | Admitting: Psychiatry

## 2012-12-09 ENCOUNTER — Encounter (HOSPITAL_COMMUNITY): Payer: Self-pay

## 2012-12-09 DIAGNOSIS — F4310 Post-traumatic stress disorder, unspecified: Secondary | ICD-10-CM

## 2012-12-09 DIAGNOSIS — F331 Major depressive disorder, recurrent, moderate: Secondary | ICD-10-CM

## 2012-12-09 DIAGNOSIS — F339 Major depressive disorder, recurrent, unspecified: Secondary | ICD-10-CM | POA: Insufficient documentation

## 2012-12-09 DIAGNOSIS — F411 Generalized anxiety disorder: Secondary | ICD-10-CM | POA: Insufficient documentation
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
## 2012-12-10 ENCOUNTER — Encounter (HOSPITAL_COMMUNITY): Payer: Self-pay | Admitting: Psychiatry

## 2012-12-10 NOTE — Progress Notes (Signed)
    Daily Group Progress Note  Program: IOP  Group Time: 9-10:30 pm  Participation Level: None  Behavioral Response: patient did not speak  Type of Therapy:  Process Group  Summary of Progress: Patient arrived to group late as she was in with case worker. She was introduced, sat down, but said nothing during this session.   Group Time: 10:45-12 pm  Participation Level:  Minimal  Behavioral Response: guarded  Type of Therapy: Psycho-education Group  Summary of Progress: The patient did not share during this session, but as the session was coming to a close, I asked if she would offer something she may have learned from this time together? She stated that 'it is possible to change'. This is a very shy and reserved young woman and the fact that she can acknowledge this possibility is viewed as positive and hopeful. She was encouraged to open up as she becomes more comfortable in the group.   Carman Ching, LCSW

## 2012-12-12 ENCOUNTER — Other Ambulatory Visit (HOSPITAL_COMMUNITY): Payer: 59 | Attending: Psychiatry | Admitting: Psychiatry

## 2012-12-12 DIAGNOSIS — F4310 Post-traumatic stress disorder, unspecified: Secondary | ICD-10-CM

## 2012-12-12 DIAGNOSIS — F339 Major depressive disorder, recurrent, unspecified: Secondary | ICD-10-CM | POA: Insufficient documentation

## 2012-12-12 DIAGNOSIS — F411 Generalized anxiety disorder: Secondary | ICD-10-CM | POA: Insufficient documentation

## 2012-12-12 DIAGNOSIS — F329 Major depressive disorder, single episode, unspecified: Secondary | ICD-10-CM

## 2012-12-13 ENCOUNTER — Telehealth (HOSPITAL_COMMUNITY): Payer: Self-pay | Admitting: Psychiatry

## 2012-12-13 ENCOUNTER — Other Ambulatory Visit (HOSPITAL_COMMUNITY): Payer: 59 | Attending: Psychiatry | Admitting: Psychiatry

## 2012-12-13 DIAGNOSIS — F329 Major depressive disorder, single episode, unspecified: Secondary | ICD-10-CM | POA: Insufficient documentation

## 2012-12-13 DIAGNOSIS — F339 Major depressive disorder, recurrent, unspecified: Secondary | ICD-10-CM | POA: Insufficient documentation

## 2012-12-13 DIAGNOSIS — F32A Depression, unspecified: Secondary | ICD-10-CM | POA: Insufficient documentation

## 2012-12-13 DIAGNOSIS — F411 Generalized anxiety disorder: Secondary | ICD-10-CM | POA: Insufficient documentation
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
## 2012-12-14 ENCOUNTER — Other Ambulatory Visit (HOSPITAL_COMMUNITY): Payer: 59 | Attending: Psychiatry

## 2012-12-14 NOTE — Progress Notes (Signed)
    Daily Group Progress Note  Program: IOP  Group Time: 9:00-10:30 am   Participation Level: Active  Behavioral Response: Appropriate  Type of Therapy:  Process Group  Summary of Progress: Pt is becoming more talkative and trusting of the group. Pt is talking about mood swinging and possible bipolar traits that make it difficult for her to manage emotions.      Group Time: 10:30 am - 12:00 pm   Participation Level:  Active  Behavioral Response: Appropriate  Type of Therapy: Psycho-education Group  Summary of Progress: Pt learned about the symptoms of depression and anxiety and how to recognize symptoms as they occur to seek treatment.   Carman Ching, LCSW

## 2012-12-15 ENCOUNTER — Other Ambulatory Visit (HOSPITAL_COMMUNITY): Payer: 59 | Attending: Psychiatry | Admitting: Psychiatry

## 2012-12-15 DIAGNOSIS — F329 Major depressive disorder, single episode, unspecified: Secondary | ICD-10-CM

## 2012-12-15 DIAGNOSIS — F411 Generalized anxiety disorder: Secondary | ICD-10-CM | POA: Insufficient documentation

## 2012-12-15 DIAGNOSIS — F339 Major depressive disorder, recurrent, unspecified: Secondary | ICD-10-CM | POA: Insufficient documentation

## 2012-12-15 NOTE — Progress Notes (Signed)
    Daily Group Progress Note  Program: IOP  Group Time: 9:00-10:30 am   Participation Level: Active  Behavioral Response: Appropriate  Type of Therapy:  Process Group  Summary of Progress: Pt continues to be very talkative and trusting within the group. Pt shared about the stress she has with the physical pain her body is in due to awaiting her hysterectomy. Pt also described home stress due to having her brother and his family living with Pt and not paying rent. Pt explored the possibility of having him move out to reduce her stress level. Pt is aware that she does not feel anything in her life is working for her right now and she feels like she is doing everything for everyone, which is a main trigger for her depression.      Group Time: 10:30 am - 12:00 pm   Participation Level:  Active  Behavioral Response: Appropriate  Type of Therapy: Psycho-education Group  Summary of Progress: Pt participated in a goodbye ceremony for another member ending and had closure while practicing having a healthy goodbye.  Carman Ching, LCSW

## 2012-12-16 ENCOUNTER — Other Ambulatory Visit (HOSPITAL_COMMUNITY): Payer: 59

## 2012-12-19 ENCOUNTER — Other Ambulatory Visit (HOSPITAL_COMMUNITY): Payer: 59 | Attending: Psychiatry | Admitting: Psychiatry

## 2012-12-19 DIAGNOSIS — F329 Major depressive disorder, single episode, unspecified: Secondary | ICD-10-CM

## 2012-12-19 DIAGNOSIS — F339 Major depressive disorder, recurrent, unspecified: Secondary | ICD-10-CM | POA: Insufficient documentation

## 2012-12-19 DIAGNOSIS — F411 Generalized anxiety disorder: Secondary | ICD-10-CM | POA: Insufficient documentation

## 2012-12-19 NOTE — Progress Notes (Signed)
    Daily Group Progress Note  Program: IOP  Group Time: 9:00-10:30 am   Participation Level: Active  Behavioral Response: Resistant and Monopolizing  Type of Therapy:  Process Group  Summary of Progress: Pt said she is in "alot of physical pain today" from her Esure birth control that is scheduled to be surgical removed in January. Pts thoughts were hopeless and negative as she described "never having had the ability to make any decisions in her life and the one decision she made for herself ruined her life". Another member and Clinical research associate explored things Pt is in control of, but Pt rebutted each one and agreed that she does not want to see what she has control of today, she wants to feel "depressed". Pt said she does not feel ready to go back to work and does not feel she will be able to work on her mental wellness until she is not in physical pain from her birth control.      Group Time: 10:30 am - 12:00 pm   Participation Level:  Active  Behavioral Response: Appropriate  Type of Therapy: Psycho-education Group  Summary of Progress: Pt discussed plans for a successful discharge from the IOP program and explored resources to help aid in continued mental wellness.   Carman Ching, LCSW

## 2012-12-20 ENCOUNTER — Other Ambulatory Visit (HOSPITAL_COMMUNITY): Payer: 59 | Admitting: Psychiatry

## 2012-12-20 DIAGNOSIS — F329 Major depressive disorder, single episode, unspecified: Secondary | ICD-10-CM

## 2012-12-20 MED ORDER — MIRTAZAPINE 15 MG PO TABS: 15.0000 mg | ORAL_TABLET | Freq: Every day | ORAL | Status: DC

## 2012-12-20 NOTE — Progress Notes (Signed)
Patient ID: Diana Francis, female   DOB: 05-17-1980, 32 y.o.   MRN: [id number] Patient reviewed and interviewed today, continues to feel depressed with insomnia and poor appetite. Has nightmares with a lot of anxiety. Patient is willing to try an antidepressant and so I discussed the rationale risks benefits options of Remeron and patient has given me her informed consent. Patient will start 7.5 mg at bedtime for one week. Denies suicidal or homicidal ideation no hallucinations or delusions.
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
## 2012-12-21 ENCOUNTER — Other Ambulatory Visit (HOSPITAL_COMMUNITY): Payer: 59

## 2012-12-23 ENCOUNTER — Other Ambulatory Visit (HOSPITAL_COMMUNITY): Payer: 59

## 2012-12-26 ENCOUNTER — Other Ambulatory Visit (HOSPITAL_COMMUNITY): Payer: 59 | Attending: Psychiatry | Admitting: Psychiatry

## 2012-12-26 DIAGNOSIS — F329 Major depressive disorder, single episode, unspecified: Secondary | ICD-10-CM

## 2012-12-26 DIAGNOSIS — F4310 Post-traumatic stress disorder, unspecified: Secondary | ICD-10-CM

## 2012-12-26 DIAGNOSIS — F339 Major depressive disorder, recurrent, unspecified: Secondary | ICD-10-CM | POA: Insufficient documentation

## 2012-12-26 DIAGNOSIS — F411 Generalized anxiety disorder: Secondary | ICD-10-CM | POA: Insufficient documentation
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
## 2012-12-27 ENCOUNTER — Other Ambulatory Visit (HOSPITAL_COMMUNITY): Payer: 59 | Admitting: Psychiatry

## 2012-12-27 NOTE — Progress Notes (Signed)
    Daily Group Progress Note  Program: IOP  Group Time: 9:00-10:30 am   Participation Level: Active  Behavioral Response: Monopolizing  Type of Therapy:  Process Group  Summary of Progress: Pt is scheduled to end group tomorrow. Pt reports improvement in mood but states her physical pain due to her failed birth control is her main cause of depression and Pt does not feel her depression symptoms will improve drastically until she has her hysterectomy in February. Pt has made progress talking in the group and trusting others to share. Pt also has some increased awareness about her tendency towards quick anger and has learned some coping skills to slow her reactivity to anger.      Group Time: 10:30 am - 12:00 pm   Participation Level:  Active  Behavioral Response: Appropriate  Type of Therapy: Psycho-education Group  Summary of Progress: Pt learned the DBT skills of distress tolerance to manage stressful situations that can't be fixed immediately.   Carman Ching, LCSW

## 2012-12-27 NOTE — Progress Notes (Signed)
Patient ID: Diana Francis, female   DOB: 06-15-1980, 31 y.o.   MRN: [id number] Pt seen c/o tiredness and irritability, due to caffeine withdrawal. reccomended 2 mountain dew and increase Remeron 15 mg q hs. No SI / HI. No Hallucinations/ delusions.

## 2012-12-28 ENCOUNTER — Other Ambulatory Visit (HOSPITAL_COMMUNITY): Payer: 59 | Admitting: Psychiatry

## 2012-12-28 DIAGNOSIS — F329 Major depressive disorder, single episode, unspecified: Secondary | ICD-10-CM

## 2012-12-28 NOTE — Patient Instructions (Addendum)
Patient completed MH-IOP today.  Will follow up with Dr. Lolly Mustache on 01-02-13 at 10:30 a.m and Boneta Lucks, [facility] on 01-04-13 @ 9 a.m.Marland Kitchen  Return to work on 01-09-13, without any restrictions.  Encouraged support groups.

## 2012-12-28 NOTE — Progress Notes (Signed)
    Daily Group Progress Note  Program: IOP  Group Time: 9:00-10:30 am   Participation Level: Active  Behavioral Response: Monopolizing  Type of Therapy:  Process Group  Summary of Progress: Today was Pts final day in the group. Pt was tearful as she said goodbye and stated she learned the following things from her group experience; that depression crosses all races, gained the ability to better manage her anger without exploding, learned to trust others and open up about her personal story, gained support, learned to have a more open mind to new experiences. Pt states her depression symptoms have reduced since starting the program. Pt states she still does not feel ready to return to work and does not feel she will be able to until January when she is scheduled for her hysterectomy.      Group Time: 10:30 am - 12:00 pm   Participation Level:  Active  Behavioral Response: Appropriate  Type of Therapy: Psycho-education Group  Summary of Progress: Pt reviewed the personal bill of rights and discussed how they apply to wellness and need to be accepted in order to begin the healing process. Pt identified the statements that they most related to and why it pertained to their current situation in life.   Carman Ching, LCSW

## 2012-12-28 NOTE — Progress Notes (Signed)
Discharge Note  Patient:  Diana Francis is an 32 y.o., female DOB:  Nov 09, 1980  Date of Admission:  12/09/12  Date of Discharge 12/28/12  Reason for Admission:Depression and anxiety, patient was referred by Dr. are seen  Hospital Course: Patient started IOP and initially he was very reluctant to try an antidepressant. She started attending groups and was able to open up and talk about her stressors. Patient had gynecological problems and was waiting for a hysterectomy and was very stressed about that. She continued to endorse insomnia and depression and again I discussed antidepressant medication and at this time she was willing to take it. Patient was started on Remeron 15 mg at bedtime to help her with her depression she tolerated this medication well. Sleep and appetite were good mood was better with no suicidal or homicidal ideation she was coping well and was tolerating her medications well.  Mental Status at Discharge: Alert, oriented x3, affect was appropriate mood was mildly anxious, speech was normal with no suicidal or homicidal ideation. No hallucinations or delusions. Recent and remote memory is good, judgment and insight is good, concentration and recall are good.  Lab Results: No results found for this or any previous visit (from the past 48 hour(s)).  Current outpatient prescriptions:ibuprofen (ADVIL,MOTRIN) 800 MG tablet, Take 1 tablet (800 mg total) by mouth every 8 (eight) hours as needed for pain., Disp: 60 tablet, Rfl: 2;  lamoTRIgine (LAMICTAL) 25 MG tablet, Take 2 tab daily, Disp: 60 tablet, Rfl: 0;  levothyroxine (SYNTHROID, LEVOTHROID) 100 MCG tablet, Take 50 mcg by mouth daily before breakfast. , Disp: , Rfl:  LORazepam (ATIVAN) 0.5 MG tablet, Take 1 tablet (0.5 mg total) by mouth daily as needed for anxiety., Disp: 30 tablet, Rfl: 0;  mirtazapine (REMERON) 15 MG tablet, Take 1 tablet (15 mg total) by mouth at bedtime., Disp: 30 tablet, Rfl: 0;  omeprazole (PRILOSEC) 20  MG capsule, Take 20 mg by mouth daily. , Disp: , Rfl:  oxyCODONE-acetaminophen (PERCOCET/ROXICET) 5-325 MG per tablet, Take 1 tablet by mouth every 4 (four) hours as needed for pain., Disp: 30 tablet, Rfl: 0  Axis Diagnosis:   Axis I: Generalized Anxiety Disorder and Major Depression, Recurrent severe Axis II: Cluster C Traits Axis III:  Past Medical History  Diagnosis Date  . Thyroid disease   . Depression   . Hypothyroidism   . GERD (gastroesophageal reflux disease)   . Headache(784.0)   . HTN (hypertension)     no meds- per pt white coat syndrome  . Anemia     history of anemia   Axis IV: economic problems, other psychosocial or environmental problems, problems related to social environment and problems with primary support group Axis V: 61-70 mild symptoms   Level of Care:  OP  Discharge destination:  Home  Is patient on multiple antipsychotic therapies at discharge:  No    Has Patient had three or more failed trials of antipsychotic monotherapy by history:  No  Patient phone:  [phone] (home)  Patient address:   [address] [facility] [address] [place] [address],   Follow-up recommendations:  Activity:  As tolerated Diet:  Regular Other:  Followup for medications with Dr.Arfeen and Jennifer brown for therapy  Comments:    The patient received suicide prevention pamphlet:  Yes   Margit Banda 12/28/2012, 11:46 AM

## 2012-12-28 NOTE — Progress Notes (Signed)
Patient ID: Diana Francis, female   DOB: [birth year], 32 y.o.   MRN: [id number] D: This is a 32 year old divorced, African American female who was referred by Dr. Lolly Mustache, treatment for depression and anxiety symptoms. States she's been depressed all her adult life. She denies any A/V Hallucination, paranoia, active or passive suicidal thoughts or HI. She endorsed chronic fatigue, feeling overwhelmed and hopeless and helpless. Patient was experiencing decreased energy, lack of motivation, lack of interest in dating things and recently noticed anxiety attacks. She admitted sometimes getting irritable and agitated but denies any aggression or violence towards her kids. Endorsed that within the past few months she had difficulty sleeping, racing thoughts, and feeling overwhelmed. No prior psychiatric hospitalizations. No prior suicide attempts or gestures. Family Hx: Maternal aunt suffers with Bipolar D/O, Maternal aunt suffers with MDD, Mother suffers with ADHD and OCD, and father issues with drugs. Triggers/Stressors: 1)Job: [employer] of 8 yrs. 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 States that it requires a lot of computer skills in which she lacks. Mention that her workplace is a hostile working environment for women. "There is a lot of gossiping there. We have rotating shifts." 2) Conflictual Relationship with mother. Due to a rotating shift, pt relies on her mother to assist with taking care of her three kids. "My mother is very controlling and she tries to take over the parenting." 32) 4 -year-old adopted son who has behavioral problems and ADD. He had failed seventh grade and and isn't doing well this school yr either. The patient has never told his son about the adoption . She has 2 other children. Patient has limited social, financial and family support. 4) Housing Situation: Pt allowed her 13 yr old brother, his wife, and three kids move into her home. They don't  contribute to any of the bills. 5) Chronic Gynecological Pain: Pt reports that doctor recently informed her that she will need a hysterectomy. Couple of weeks ago, pt just had a Laparoscopy done.  Pt completed MH-IOP today.  States she continues to struggle with sleep issues, poor energy and sadness.  C/O Remeron making her feel more irritable and groggy.  Reports that she is able to make decisions, appetite improved and decreased anxiety.   States that group was helpful, but distracting for her because she had the need to help others.  Pt wants to continue working on anger management techniques with her therapist.  A:  D/C today.  F/U with Dr. Lolly Mustache on 01-02-13 and Boneta Lucks, [facility] on 01-04-13.  RTW on 01-09-13, without any restrictions.  Encouraged support groups.  R:  Pt receptive.

## 2012-12-29 ENCOUNTER — Ambulatory Visit (INDEPENDENT_AMBULATORY_CARE_PROVIDER_SITE_OTHER): Payer: 59 | Admitting: Obstetrics & Gynecology

## 2012-12-29 ENCOUNTER — Encounter: Payer: Self-pay | Admitting: Obstetrics & Gynecology

## 2012-12-29 VITALS — BP 154/90 | HR 98 | Ht 64.5 in | Wt 134.6 lb

## 2012-12-29 DIAGNOSIS — N949 Unspecified condition associated with female genital organs and menstrual cycle: Secondary | ICD-10-CM

## 2012-12-29 DIAGNOSIS — Z09 Encounter for follow-up examination after completed treatment for conditions other than malignant neoplasm: Secondary | ICD-10-CM

## 2012-12-29 NOTE — Progress Notes (Signed)
   Subjective:    Patient ID: Diana Francis, female    DOB: Jun 05, 1980, 32 y.o.   MRN: [id number]  HPI  32 yo lady here for post op visit after d&c and diagnostic laparoscopy. Pathology was negative for both.She is here to discuss treatment options of her pelvic pain. She would like a hysterectomy. Based on her laparoscopy, I will leave her ovaries in situ. I can do this via LAVH. I will remove her oviducts as well.  Review of Systems     Objective:   Physical Exam        Assessment & Plan:  cpp possibly related to her Essure. I will send a email to [name] today for scheduling.

## 2012-12-30 ENCOUNTER — Other Ambulatory Visit (HOSPITAL_COMMUNITY): Payer: 59

## 2013-01-02 ENCOUNTER — Ambulatory Visit (INDEPENDENT_AMBULATORY_CARE_PROVIDER_SITE_OTHER): Payer: 59 | Admitting: Psychiatry

## 2013-01-02 ENCOUNTER — Other Ambulatory Visit (HOSPITAL_COMMUNITY): Payer: 59

## 2013-01-02 ENCOUNTER — Encounter (HOSPITAL_COMMUNITY): Payer: Self-pay | Admitting: Psychiatry

## 2013-01-02 VITALS — BP 144/95 | HR 96 | Ht 64.5 in | Wt 133.4 lb

## 2013-01-02 DIAGNOSIS — F39 Unspecified mood [affective] disorder: Secondary | ICD-10-CM

## 2013-01-02 DIAGNOSIS — F329 Major depressive disorder, single episode, unspecified: Secondary | ICD-10-CM

## 2013-01-02 DIAGNOSIS — F319 Bipolar disorder, unspecified: Secondary | ICD-10-CM

## 2013-01-02 MED ORDER — HYDROXYZINE HCL 25 MG PO TABS: ORAL_TABLET | ORAL | Status: DC

## 2013-01-02 MED ORDER — LAMOTRIGINE 100 MG PO TABS: ORAL_TABLET | ORAL | Status: DC

## 2013-01-02 NOTE — Progress Notes (Signed)
[facility] [id number] Progress Note  Diana Francis [id number] 32 y.o.  01/02/2013 11:14 AM  Chief Complaint:  I like Lamictal.  Remeron is making notable and groggy.  History of Present Illness. Diana Francis came for her followup appointment.  She recently finished intensive outpatient program.  She was not happy because she was again started on Remeron which makes her very groggy sleepy and irritable.  She's been doing a lot of research and now she admitted that she has bipolar disorder.  She wants to continue Lamictal.  She is also very anxious because of upcoming surgery.  She stated to have hysterectomy this Wednesday.  She continues to have irritability anger and mood swings.  She has a lot of abdominal pain and hoping after the surgery she may not have pelvic pain.  She has mixed feelings about the surgery because she is losing her uterus .  She has not discussed with the therapist in detail about her mixed feelings regarding surgery.  However she wants to continue surgery because she cannot stand pain every day.  She sleeping on and off.  She stopped taking Remeron and she is not feeling groggy however she continued to have depression, crying spells, panic attack and mood swing.  She is not drinking or using any illegal substances.  She has not back to work since August .  She is on FMLA because of severe physical issues and her psychiatric problems.  I still feel that she is not ready to restart her work especially upcoming hysterectomy in 2 days.  She admitted the isolated and withdrawn due to the holidays and limited support and family network.  Suicidal Ideation: No Plan Formed: No Patient has means to carry out plan: No  Homicidal Ideation: No Plan Formed: No Patient has means to carry out plan: No  Review of Systems: Psychiatric: Agitation: No Hallucination: No Depressed Mood: Yes Insomnia: No Hypersomnia: No Altered Concentration: No Feels Worthless: No Grandiose  Ideas: No Belief In Special Powers: No New/Increased Substance Abuse: No Compulsions: No  Neurologic: Headache: Yes Seizure: No Paresthesias: No  Medical History;  Patient has hypertension and hypothyroidism.  She see Dr. Pollyann Kennedy at [facility].  She denies any history of traumatic brain injury, seizures or any concussion.  Psychosocial History: Patient was born and raised in [place]. Her parents never married. She adopted at 74-week-old child at age 33 . The patient told she was visiting her aunt and her neighbor left the child with a promise that she would be back if she can do babysitting. Patient told she never come back and since then she is taking care of this child. She's formally adopted this child soon after her daughter born. Patient has another son who is 69 years old. Patient married once but regrets her marriage. She got divorced in 2009. She did not provide much detail about her marriage. She has one brother who is in a Hotel manager and another brother who lives with the patient. The patient has a difficult relationship with her mother. Patient has some support from her ex-husband.   Family history The patient endorsed mother has depression and on has bipolar disorder.  Alcohol and substance use history. Patient denies any history of alcohol or illegal substance use.  Education and work history. Patient has a GED and currently working at [employer] for past 8 years.   Outpatient Encounter Prescriptions as of 01/02/2013  Medication Sig  . ibuprofen (ADVIL,MOTRIN) 800 MG tablet Take 1  tablet (800 mg total) by mouth every 8 (eight) hours as needed for pain.  Marland Kitchen lamoTRIgine (LAMICTAL) 100 MG tablet Take 1 tab daily  . levothyroxine (SYNTHROID, LEVOTHROID) 100 MCG tablet Take 50 mcg by mouth daily before breakfast.   . LORazepam (ATIVAN) 0.5 MG tablet Take 1 tablet (0.5 mg total) by mouth daily as needed for anxiety.  Marland Kitchen omeprazole (PRILOSEC) 20 MG capsule Take  20 mg by mouth daily.   Marland Kitchen oxyCODONE-acetaminophen (PERCOCET/ROXICET) 5-325 MG per tablet Take 1 tablet by mouth every 4 (four) hours as needed for pain.  . [DISCONTINUED] lamoTRIgine (LAMICTAL) 25 MG tablet Take 2 tab daily  . hydrOXYzine (ATARAX/VISTARIL) 25 MG tablet Take 1-2 tab at bed time  . [DISCONTINUED] mirtazapine (REMERON) 15 MG tablet Take 1 tablet (15 mg total) by mouth at bedtime.    Recent Results (from the past 2160 hour(s))  CBC     Status: None   Collection Time    11/17/12 11:30 AM      Result Value Range   WBC 8.2  4.0 - 10.5 K/uL   RBC 4.15  3.87 - 5.11 MIL/uL   Hemoglobin 13.3  12.0 - 15.0 g/dL   HCT 16.1  09.6 - 04.5 %   MCV 90.1  78.0 - 100.0 fL   MCH 32.0  26.0 - 34.0 pg   MCHC 35.6  30.0 - 36.0 g/dL   RDW 40.9  81.1 - 91.4 %   Platelets 252  150 - 400 K/uL  PREGNANCY, URINE     Status: None   Collection Time    11/24/12  6:45 AM      Result Value Range   Preg Test, Ur NEGATIVE  NEGATIVE   Comment:            THE SENSITIVITY OF THIS     METHODOLOGY IS >20 mIU/mL.    Past Psychiatric History/Hospitalization(s) Patient has done twice intensive outpatient program .  Patient denies any history of psychiatric inpatient treatment or any suicidal attempt however endorse history of severe mood swings irritability and anger.  She denies any psychosis or any hallucination.  She was referred to [place] from primary care physician who started her on Prozac but she stopped because of significant nausea and vomiting.  In the past she had tried Zoloft, Remeron with limited response.   Anxiety: Yes Bipolar Disorder: No Depression: Yes Mania: No Psychosis: No Schizophrenia: No Personality Disorder: No Hospitalization for psychiatric illness: No History of Electroconvulsive Shock Therapy: No Prior Suicide Attempts: No  Physical Exam: Constitutional:  BP 144/95  Pulse 96  Ht 5' 4.5" (1.638 m)  Wt 133 lb 6.4 oz (60.51 kg)  BMI 22.55 kg/m2  LMP  12/19/2012  Musculoskeletal: Strength & Muscle Tone: within normal limits Gait & Station: normal Patient leans: N/A  Mental Status Examination;  Patient is a young female who appears thin and casually dressed.  She is complaining of abdominal pain .  She appears tired and exhausted .  She described her mood is anxious and her affect is constricted.  Her speech is soft however clear and coherent.  Her thought process is slow but logical and goal directed.  She denies any active or passive suicidal thoughts or homicidal thoughts.  She denies any auditory or visual hallucination.  Her fund of knowledge is average.  There is no paranoia or delusions present at this time.  She's alert and oriented x3.  Her insight judgment and impulse control is okay.  Medical Decision Making (Choose Three): Established Problem, Stable/Improving (1), Review of Psycho-Social Stressors (1), Review or order clinical lab tests (1), Review of Last Therapy Session (1), Review of Medication Regimen & Side Effects (2) and Review of New Medication or Change in Dosage (2)  Assessment: Axis I: Mood disorder NOS, rule out bipolar disorder.  I will change the diagnosis as patient has done research and also experiencing increased mood swings anger and fear that she has underlying bipolar disorder.  She likes to limit.  I will discontinue Remeron.  We will increase the Lamictal 150 mg daily.  Patient still has Ativan remaining.  I reviewed the notes from intensive outpatient program.  Patient scheduled to have abdominal surgery Axis II: Deferred  Axis III:  Past Medical History  Diagnosis Date  . Thyroid disease   . Depression   . Hypothyroidism   . GERD (gastroesophageal reflux disease)   . Headache(784.0)   . HTN (hypertension)     no meds- per pt white coat syndrome  . Anemia     history of anemia    Axis IV: Moderate   Plan:  Admitted that she has a lot of mood swings and anger.  She may have bipolar disorder.   We'll continue Lamictal however increase the dose to 150 mg daily.  I will discontinue Remeron which is causing more irritable and groggy.  Patient has refill remaining on Ativan.  Patient is scheduled to have a hysterectomy in 2 days.  At this time patient cannot walk and we will extend her FMLA until I see her in 4 weeks. Time spent 25 minutes.  More than 50% of the time spent in psychoeducation, counseling and coordination of care.  Discuss safety plan that anytime having active suicidal thoughts or homicidal thoughts then patient need to call 911 or go to the local emergency room.   Bardia Wangerin T., MD 01/02/2013

## 2013-01-03 ENCOUNTER — Encounter (HOSPITAL_COMMUNITY)
Admission: RE | Admit: 2013-01-03 | Discharge: 2013-01-03 | Disposition: A | Discharge status: Home / Self Care | Payer: 59 | Source: Ambulatory Visit | Attending: Obstetrics & Gynecology | Admitting: Obstetrics & Gynecology

## 2013-01-03 ENCOUNTER — Encounter (HOSPITAL_COMMUNITY): Payer: Self-pay

## 2013-01-03 ENCOUNTER — Other Ambulatory Visit (HOSPITAL_COMMUNITY): Payer: 59

## 2013-01-03 LAB — CBC
HCT: 37.2 % (ref 36.0–46.0)
MCH: 31.7 pg (ref 26.0–34.0)
MCHC: 33.6 g/dL (ref 30.0–36.0)
MCV: 94.4 fL (ref 78.0–100.0)
Platelets: 309 10*3/uL (ref 150–400)
RDW: 14.2 % (ref 11.5–15.5)
WBC: 9.4 10*3/uL (ref 4.0–10.5)

## 2013-01-03 MED ORDER — CEFAZOLIN SODIUM-DEXTROSE 2-3 GM-% IV SOLR
2.0000 g | INTRAVENOUS | Status: AC
  Administered 2013-01-04: 2 g via INTRAVENOUS

## 2013-01-03 NOTE — Patient Instructions (Signed)
Your procedure is scheduled on:01/04/13  Enter through the Main Entrance at :9am Pick up desk phone and dial [phone] and inform us of your arrival.  Please call [phone] if you have any problems the morning of surgery.  Remember: Do not eat food or drink liquids, including water, after midnight: tonight   You may brush your teeth the morning of surgery.  Take these meds the morning of surgery with a sip of water: Synthroid  DO NOT wear jewelry, eye make-up, lipstick,body lotion, or dark fingernail polish.  (Polished toes are ok) You may wear deodorant.  If you are to be admitted after surgery, leave suitcase in car until your room has been assigned. Patients discharged on the day of surgery will not be allowed to drive home. Wear loose fitting, comfortable clothes for your ride home.

## 2013-01-04 ENCOUNTER — Encounter (HOSPITAL_COMMUNITY): Payer: 59 | Admitting: Anesthesiology

## 2013-01-04 ENCOUNTER — Encounter (HOSPITAL_COMMUNITY)
Admission: RE | Disposition: A | Discharge status: Home / Self Care | Payer: Self-pay | Source: Ambulatory Visit | Attending: Obstetrics & Gynecology

## 2013-01-04 ENCOUNTER — Ambulatory Visit (HOSPITAL_COMMUNITY): Payer: 59 | Admitting: Anesthesiology

## 2013-01-04 ENCOUNTER — Ambulatory Visit (HOSPITAL_COMMUNITY): Payer: Self-pay | Admitting: Psychiatry

## 2013-01-04 ENCOUNTER — Other Ambulatory Visit (HOSPITAL_COMMUNITY): Payer: 59

## 2013-01-04 ENCOUNTER — Encounter (HOSPITAL_COMMUNITY): Payer: Self-pay | Admitting: Anesthesiology

## 2013-01-04 ENCOUNTER — Observation Stay (HOSPITAL_COMMUNITY)
Admission: RE | Admit: 2013-01-04 | Discharge: 2013-01-05 | Disposition: A | Discharge status: Home / Self Care | Payer: 59 | Source: Ambulatory Visit | Attending: Obstetrics & Gynecology | Admitting: Obstetrics & Gynecology

## 2013-01-04 DIAGNOSIS — N949 Unspecified condition associated with female genital organs and menstrual cycle: Secondary | ICD-10-CM | POA: Insufficient documentation

## 2013-01-04 DIAGNOSIS — R102 Pelvic and perineal pain: Secondary | ICD-10-CM

## 2013-01-04 DIAGNOSIS — IMO0002 Reserved for concepts with insufficient information to code with codable children: Secondary | ICD-10-CM | POA: Insufficient documentation

## 2013-01-04 DIAGNOSIS — N938 Other specified abnormal uterine and vaginal bleeding: Principal | ICD-10-CM | POA: Insufficient documentation

## 2013-01-04 DIAGNOSIS — N838 Other noninflammatory disorders of ovary, fallopian tube and broad ligament: Secondary | ICD-10-CM | POA: Insufficient documentation

## 2013-01-04 DIAGNOSIS — Z9889 Other specified postprocedural states: Secondary | ICD-10-CM

## 2013-01-04 DIAGNOSIS — G8929 Other chronic pain: Secondary | ICD-10-CM | POA: Insufficient documentation

## 2013-01-04 HISTORY — PX: VAGINAL HYSTERECTOMY: SHX2639

## 2013-01-04 SURGERY — HYSTERECTOMY, VAGINAL
Anesthesia: General | Site: Vagina

## 2013-01-04 MED ORDER — GLYCOPYRROLATE 0.2 MG/ML IJ SOLN
INTRAMUSCULAR | Status: AC
  Filled 2013-01-04: qty 1

## 2013-01-04 MED ORDER — ONDANSETRON HCL 4 MG/2ML IJ SOLN
INTRAMUSCULAR | Status: DC | PRN
  Administered 2013-01-04: 4 mg via INTRAVENOUS

## 2013-01-04 MED ORDER — ROCURONIUM BROMIDE 100 MG/10ML IV SOLN
INTRAVENOUS | Status: DC | PRN
  Administered 2013-01-04: 35 mg via INTRAVENOUS

## 2013-01-04 MED ORDER — SODIUM CHLORIDE 0.9 % IJ SOLN
INTRAMUSCULAR | Status: AC
  Filled 2013-01-04: qty 50

## 2013-01-04 MED ORDER — SODIUM CHLORIDE 0.9 % IJ SOLN
INTRAMUSCULAR | Status: DC | PRN
  Administered 2013-01-04: 100 mL via INTRAVENOUS

## 2013-01-04 MED ORDER — IBUPROFEN 800 MG PO TABS
800.0000 mg | ORAL_TABLET | Freq: Three times a day (TID) | ORAL | Status: DC | PRN
  Administered 2013-01-04 – 2013-01-05 (×2): 800 mg via ORAL
  Filled 2013-01-04 (×2): qty 1

## 2013-01-04 MED ORDER — PROPOFOL 10 MG/ML IV EMUL
INTRAVENOUS | Status: AC
  Filled 2013-01-04: qty 20

## 2013-01-04 MED ORDER — PROPOFOL 10 MG/ML IV BOLUS
INTRAVENOUS | Status: DC | PRN
  Administered 2013-01-04: 160 mg via INTRAVENOUS

## 2013-01-04 MED ORDER — LAMOTRIGINE 100 MG PO TABS
100.0000 mg | ORAL_TABLET | Freq: Every day | ORAL | Status: DC
  Administered 2013-01-04: 100 mg via ORAL
  Filled 2013-01-04: qty 1

## 2013-01-04 MED ORDER — CEFAZOLIN SODIUM-DEXTROSE 2-3 GM-% IV SOLR
INTRAVENOUS | Status: AC
  Filled 2013-01-04: qty 50

## 2013-01-04 MED ORDER — MIDAZOLAM HCL 2 MG/2ML IJ SOLN
INTRAMUSCULAR | Status: DC | PRN
  Administered 2013-01-04: 2 mg via INTRAVENOUS

## 2013-01-04 MED ORDER — LEVOTHYROXINE SODIUM 25 MCG PO TABS
25.0000 ug | ORAL_TABLET | Freq: Every day | ORAL | Status: DC
  Administered 2013-01-05: 25 ug via ORAL
  Filled 2013-01-04: qty 1

## 2013-01-04 MED ORDER — PANTOPRAZOLE SODIUM 40 MG PO TBEC
40.0000 mg | DELAYED_RELEASE_TABLET | Freq: Every day | ORAL | Status: DC
  Filled 2013-01-04 (×2): qty 1

## 2013-01-04 MED ORDER — NEOSTIGMINE METHYLSULFATE 1 MG/ML IJ SOLN: INTRAMUSCULAR | Status: DC | PRN

## 2013-01-04 MED ORDER — METOCLOPRAMIDE HCL 5 MG/ML IJ SOLN: 10.0000 mg | Freq: Once | INTRAMUSCULAR | Status: DC | PRN

## 2013-01-04 MED ORDER — ROCURONIUM BROMIDE 100 MG/10ML IV SOLN
INTRAVENOUS | Status: AC
  Filled 2013-01-04: qty 1

## 2013-01-04 MED ORDER — OXYCODONE-ACETAMINOPHEN 5-325 MG PO TABS: 1.0000 | ORAL_TABLET | ORAL | Status: DC | PRN

## 2013-01-04 MED ORDER — ONDANSETRON HCL 4 MG/2ML IJ SOLN
INTRAMUSCULAR | Status: AC
  Filled 2013-01-04: qty 2

## 2013-01-04 MED ORDER — ONDANSETRON HCL 4 MG/2ML IJ SOLN: 4.0000 mg | Freq: Four times a day (QID) | INTRAMUSCULAR | Status: DC | PRN

## 2013-01-04 MED ORDER — BUPIVACAINE-EPINEPHRINE 0.5% -1:200000 IJ SOLN
INTRAMUSCULAR | Status: DC | PRN
  Administered 2013-01-04: 30 mL

## 2013-01-04 MED ORDER — NEOSTIGMINE METHYLSULFATE 1 MG/ML IJ SOLN
INTRAMUSCULAR | Status: AC
  Filled 2013-01-04: qty 1

## 2013-01-04 MED ORDER — KETOROLAC TROMETHAMINE 30 MG/ML IJ SOLN
INTRAMUSCULAR | Status: AC
  Filled 2013-01-04: qty 1

## 2013-01-04 MED ORDER — LIDOCAINE HCL (CARDIAC) 20 MG/ML IV SOLN
INTRAVENOUS | Status: DC | PRN
  Administered 2013-01-04: 30 mg via INTRAVENOUS

## 2013-01-04 MED ORDER — BUPIVACAINE HCL (PF) 0.5 % IJ SOLN: INTRAMUSCULAR | Status: DC | PRN

## 2013-01-04 MED ORDER — MIDAZOLAM HCL 2 MG/2ML IJ SOLN
INTRAMUSCULAR | Status: AC
  Filled 2013-01-04: qty 2

## 2013-01-04 MED ORDER — GLYCOPYRROLATE 0.2 MG/ML IJ SOLN
INTRAMUSCULAR | Status: AC
  Filled 2013-01-04: qty 3

## 2013-01-04 MED ORDER — LACTATED RINGERS IV SOLN
INTRAVENOUS | Status: DC
  Administered 2013-01-04 (×2): via INTRAVENOUS

## 2013-01-04 MED ORDER — FENTANYL CITRATE 0.05 MG/ML IJ SOLN
INTRAMUSCULAR | Status: DC | PRN
  Administered 2013-01-04 (×5): 50 ug via INTRAVENOUS

## 2013-01-04 MED ORDER — FENTANYL CITRATE 0.05 MG/ML IJ SOLN
INTRAMUSCULAR | Status: AC
  Filled 2013-01-04: qty 5

## 2013-01-04 MED ORDER — MEPERIDINE HCL 25 MG/ML IJ SOLN: 6.2500 mg | INTRAMUSCULAR | Status: DC | PRN

## 2013-01-04 MED ORDER — HYDROXYZINE HCL 10 MG PO TABS
10.0000 mg | ORAL_TABLET | Freq: Every day | ORAL | Status: DC
  Administered 2013-01-04: 10 mg via ORAL
  Filled 2013-01-04: qty 1

## 2013-01-04 MED ORDER — DEXAMETHASONE SODIUM PHOSPHATE 10 MG/ML IJ SOLN
INTRAMUSCULAR | Status: AC
  Filled 2013-01-04: qty 1

## 2013-01-04 MED ORDER — GLYCOPYRROLATE 0.2 MG/ML IJ SOLN
INTRAMUSCULAR | Status: DC | PRN
  Administered 2013-01-04: 0.6 mg via INTRAVENOUS

## 2013-01-04 MED ORDER — HYDROMORPHONE HCL PF 1 MG/ML IJ SOLN
INTRAMUSCULAR | Status: AC
  Filled 2013-01-04: qty 1

## 2013-01-04 MED ORDER — HYDROMORPHONE HCL PF 1 MG/ML IJ SOLN
0.2500 mg | INTRAMUSCULAR | Status: DC | PRN
  Administered 2013-01-04 (×2): 0.5 mg via INTRAVENOUS

## 2013-01-04 MED ORDER — KETOROLAC TROMETHAMINE 30 MG/ML IJ SOLN
INTRAMUSCULAR | Status: DC | PRN
  Administered 2013-01-04: 30 mg via INTRAVENOUS

## 2013-01-04 MED ORDER — HYDROMORPHONE HCL PF 1 MG/ML IJ SOLN
INTRAMUSCULAR | Status: AC
Start: 2013-01-04 — End: 2013-01-04
  Administered 2013-01-04: 0.5 mg via INTRAVENOUS
  Filled 2013-01-04: qty 1

## 2013-01-04 MED ORDER — NEOSTIGMINE METHYLSULFATE 1 MG/ML IJ SOLN
INTRAMUSCULAR | Status: DC | PRN
  Administered 2013-01-04: 3 mg via INTRAVENOUS

## 2013-01-04 MED ORDER — DEXAMETHASONE SODIUM PHOSPHATE 10 MG/ML IJ SOLN
INTRAMUSCULAR | Status: DC | PRN
  Administered 2013-01-04: 10 mg via INTRAVENOUS

## 2013-01-04 MED ORDER — LIDOCAINE HCL (CARDIAC) 20 MG/ML IV SOLN
INTRAVENOUS | Status: AC
  Filled 2013-01-04: qty 5

## 2013-01-04 MED ORDER — OXYCODONE-ACETAMINOPHEN 5-325 MG PO TABS
1.0000 | ORAL_TABLET | ORAL | Status: DC | PRN
  Administered 2013-01-04 – 2013-01-05 (×4): 1 via ORAL
  Administered 2013-01-05: 2 via ORAL
  Filled 2013-01-04: qty 2
  Filled 2013-01-04 (×4): qty 1

## 2013-01-04 MED ORDER — ONDANSETRON HCL 4 MG PO TABS: 4.0000 mg | ORAL_TABLET | Freq: Four times a day (QID) | ORAL | Status: DC | PRN

## 2013-01-04 MED ORDER — BUPIVACAINE-EPINEPHRINE (PF) 0.5% -1:200000 IJ SOLN
INTRAMUSCULAR | Status: AC
  Filled 2013-01-04: qty 10

## 2013-01-04 MED ORDER — OMEPRAZOLE 20 MG PO CPDR
20.0000 mg | DELAYED_RELEASE_CAPSULE | Freq: Every day | ORAL | Status: DC
  Administered 2013-01-04: 20 mg via ORAL
  Filled 2013-01-04 (×2): qty 1

## 2013-01-04 MED ORDER — BUPIVACAINE HCL (PF) 0.5 % IJ SOLN
INTRAMUSCULAR | Status: AC
  Filled 2013-01-04: qty 30

## 2013-01-04 SURGICAL SUPPLY — 41 items
APPLICATOR COTTON TIP 6IN STRL (MISCELLANEOUS) ×3 IMPLANT
CABLE HIGH FREQUENCY MONO STRZ (ELECTRODE) ×3 IMPLANT
CANISTER SUCT 3000ML (MISCELLANEOUS) ×3 IMPLANT
CATH ROBINSON RED A/P 16FR (CATHETERS) ×3 IMPLANT
CLOTH BEACON ORANGE TIMEOUT ST (SAFETY) ×3 IMPLANT
CONT PATH 16OZ SNAP LID 3702 (MISCELLANEOUS) ×3 IMPLANT
DECANTER SPIKE VIAL GLASS SM (MISCELLANEOUS) ×6 IMPLANT
DURAPREP 26ML APPLICATOR (WOUND CARE) ×6 IMPLANT
ELECT REM PT RETURN 9FT ADLT (ELECTROSURGICAL)
ELECTRODE REM PT RTRN 9FT ADLT (ELECTROSURGICAL) IMPLANT
FORCEPS CUTTING 33CM 5MM (CUTTING FORCEPS) IMPLANT
FORCEPS CUTTING 45CM 5MM (CUTTING FORCEPS) IMPLANT
GLOVE BIO SURGEON STRL SZ 6.5 (GLOVE) ×6 IMPLANT
GOWN STRL REIN XL XLG (GOWN DISPOSABLE) ×12 IMPLANT
NDL SAFETY ECLIPSE 18X1.5 (NEEDLE) ×2 IMPLANT
NEEDLE HYPO 18GX1.5 SHARP (NEEDLE) ×1
NEEDLE INSUFFLATION 120MM (ENDOMECHANICALS) ×3 IMPLANT
NEEDLE MAYO .5 CIRCLE (NEEDLE) ×3 IMPLANT
NEEDLE SPNL 18GX3.5 QUINCKE PK (NEEDLE) ×3 IMPLANT
NS IRRIG 1000ML POUR BTL (IV SOLUTION) ×3 IMPLANT
PACK LAVH (CUSTOM PROCEDURE TRAY) ×3 IMPLANT
POUCH SPECIMEN RETRIEVAL 10MM (ENDOMECHANICALS) IMPLANT
PROTECTOR NERVE ULNAR (MISCELLANEOUS) ×3 IMPLANT
SCALPEL HARMONIC ACE (MISCELLANEOUS) IMPLANT
SET IRRIG TUBING LAPAROSCOPIC (IRRIGATION / IRRIGATOR) IMPLANT
SUT VIC AB 0 CT1 36 (SUTURE) ×3 IMPLANT
SUT VIC AB 2-0 CT1 18 (SUTURE) ×6 IMPLANT
SUT VIC AB 2-0 CT1 27 (SUTURE) ×1
SUT VIC AB 2-0 CT1 TAPERPNT 27 (SUTURE) ×2 IMPLANT
SUT VICRYL 0 ENDOLOOP (SUTURE) ×3 IMPLANT
SUT VICRYL 0 TIES 12 18 (SUTURE) ×3 IMPLANT
SUT VICRYL 0 UR6 27IN ABS (SUTURE) ×6 IMPLANT
SUT VICRYL 4-0 PS2 18IN ABS (SUTURE) ×6 IMPLANT
SYR 30ML LL (SYRINGE) ×3 IMPLANT
SYRINGE IRR TOOMEY STRL 70CC (SYRINGE) ×3 IMPLANT
TOWEL OR 17X24 6PK STRL BLUE (TOWEL DISPOSABLE) ×9 IMPLANT
TRAY FOLEY CATH 14FR (SET/KITS/TRAYS/PACK) ×3 IMPLANT
TROCAR XCEL NON-BLD 11X100MML (ENDOMECHANICALS) IMPLANT
TROCAR XCEL NON-BLD 5MMX100MML (ENDOMECHANICALS) IMPLANT
WARMER LAPAROSCOPE (MISCELLANEOUS) ×3 IMPLANT
WATER STERILE IRR 1000ML POUR (IV SOLUTION) ×3 IMPLANT

## 2013-01-04 NOTE — Anesthesia Preprocedure Evaluation (Signed)
Anesthesia Evaluation  Patient identified by MRN, date of birth, ID band Patient awake    Reviewed: Allergy & Precautions, H&P , NPO status , Patient's Chart, lab work & pertinent test results  Airway Mallampati: III TM Distance: >3 FB Neck ROM: Full    Dental no notable dental hx. (+) Teeth Intact   Pulmonary Current Smoker,  breath sounds clear to auscultation  Pulmonary exam normal       Cardiovascular hypertension, Pt. on medications Rhythm:Regular Rate:Normal     Neuro/Psych  Headaches, PSYCHIATRIC DISORDERS Depression Bipolar Disorder    GI/Hepatic Neg liver ROS, GERD-  Medicated and Controlled,  Endo/Other  Hypothyroidism   Renal/GU negative Renal ROS  negative genitourinary   Musculoskeletal negative musculoskeletal ROS (+)   Abdominal   Peds  Hematology negative hematology ROS (+) anemia ,   Anesthesia Other Findings   Reproductive/Obstetrics Pelvic Pain                           Anesthesia Physical Anesthesia Plan  ASA: II  Anesthesia Plan: General   Post-op Pain Management:    Induction: Intravenous  Airway Management Planned: Oral ETT  Additional Equipment:   Intra-op Plan:   Post-operative Plan: Extubation in OR  Informed Consent: I have reviewed the patients History and Physical, chart, labs and discussed the procedure including the risks, benefits and alternatives for the proposed anesthesia with the patient or authorized representative who has indicated his/her understanding and acceptance.   Dental advisory given  Plan Discussed with: CRNA, Anesthesiologist and Surgeon  Anesthesia Plan Comments:         Anesthesia Quick Evaluation

## 2013-01-04 NOTE — Progress Notes (Signed)
Ur chart review completed.  

## 2013-01-04 NOTE — H&P (Signed)
Diana Francis  is an 32 y.o. S AAS  female.  here today for a LAVH and BS due to irregular bleeding for the last 5 months, definitely not regular cycles, sometimes 2 periods per month.  She started having pelvic pain, not related to period, radiation to lower back, side and front of thighs. She says this started about 3 years ago, with the placement of Essure for permanent sterility.  Some dyspareunia, but no sex since July. She underwent a diagnostic laparoscopy last month and no endometriosis was seen. I did a d&c at the same time and the pathology was normal. She is adament that she wants a hysterectomy.     Pertinent Gynecological History: Menses: irregular occurring approximately every 14 days with spotting approximately 7 days per month Bleeding: intermenstrual bleeding Contraception: none DES exposure: denies Blood transfusions: none Sexually transmitted diseases: no past history Previous GYN Procedures: d&c and diagnostic laparoscopy   Last pap: normal Date: 2014   Menstrual History:  Patient's last menstrual period was 12/19/2012.    Past Medical History  Diagnosis Date  . Thyroid disease   . Hypothyroidism   . GERD (gastroesophageal reflux disease)   . Headache(784.0)   . HTN (hypertension)     no meds- per pt white coat syndrome  . Anemia     history of anemia  . Depression     Bipolar    Past Surgical History  Procedure Laterality Date  . Tubal ligation    . Laparoscopy N/A 11/24/2012    Procedure: LAPAROSCOPY DIAGNOSTIC;  Surgeon: Allie Bossier, MD;  Location: [facility];  Service: Gynecology;  Laterality: N/A;  . Dilation and curettage of uterus N/A 11/24/2012    Procedure: DILATATION AND CURETTAGE;  Surgeon: Allie Bossier, MD;  Location: [facility];  Service: Gynecology;  Laterality: N/A;    Family History  Problem Relation Age of Onset  . Depression Mother   . Bipolar disorder Maternal Aunt     Social History:  reports that she has been smoking Cigarettes.   She has been smoking about 0.50 packs per day. She has never used smokeless tobacco. She reports that she does not drink alcohol or use illicit drugs.  Allergies: No Known Allergies  Prescriptions prior to admission  Medication Sig Dispense Refill  . hydrOXYzine (ATARAX/VISTARIL) 25 MG tablet Take 1-2 tab at bed time  60 tablet  0  . ibuprofen (ADVIL,MOTRIN) 800 MG tablet Take 1 tablet (800 mg total) by mouth every 8 (eight) hours as needed for pain.  60 tablet  2  . lamoTRIgine (LAMICTAL) 100 MG tablet Take 1 tab daily  30 tablet  0  . levothyroxine (SYNTHROID, LEVOTHROID) 100 MCG tablet Take 50 mcg by mouth daily before breakfast.       . levothyroxine (SYNTHROID, LEVOTHROID) 25 MCG tablet Take 25 mcg by mouth daily before breakfast.      . omeprazole (PRILOSEC) 20 MG capsule Take 20 mg by mouth daily.         ROS  Blood pressure 126/94, pulse 78, temperature 97.9 F (36.6 C), temperature source Oral, resp. rate 20, last menstrual period 12/19/2012, SpO2 100.00%. Physical Exam Heart- rrr Lungs- CTAB Abd- benign  Results for orders placed during the hospital encounter of 01/04/13 (from the past 24 hour(s))  PREGNANCY, URINE     Status: None   Collection Time    01/04/13  9:00 AM      Result Value Range   Preg Test, Ur NEGATIVE  NEGATIVE    No results found.  Assessment/Plan: Chronic pelvic pain and dysfunctional uterine bleeding I will proceed with a LAVH/BS.  She understands the risks of surgery, including, but not to infection, bleeding, DVTs, damage to bowel, bladder, ureters. She wishes to proceed.    Xara Paulding C. 01/04/2013, 9:58 AM

## 2013-01-04 NOTE — Transfer of Care (Signed)
Immediate Anesthesia Transfer of Care Note  Patient: Diana Francis  Procedure(s) Performed: Procedure(s): HYSTERECTOMY VAGINAL with Bilateral Fallopian Tubes (Bilateral)  Patient Location: PACU  Anesthesia Type:General  Level of Consciousness: awake, alert  and oriented  Airway & Oxygen Therapy: Patient Spontanous Breathing and Patient connected to nasal cannula oxygen  Post-op Assessment: Report given to PACU RN  Post vital signs: Reviewed  Complications: No apparent anesthesia complications

## 2013-01-04 NOTE — Op Note (Signed)
01/04/2013  12:23 PM  PATIENT:  Diana Francis  32 y.o. female  PRE-OPERATIVE DIAGNOSIS:  Chronic pelvic pain, dysfunctional uterine bleeding  POST-OPERATIVE DIAGNOSIS:  same  PROCEDURE:  Procedure(s): HYSTERECTOMY VAGINAL with Bilateral Fallopian Tubes (Bilateral)  SURGEON:  Surgeon(s) and Role:    * Allie Bossier, MD - Primary     PHYSICIAN ASSISTANT:   ASSISTANTS: Scheryl Darter, MD  ANESTHESIA:   general  EBL:  Total I/O In: 1700 [I.V.:1700] Out: 50 [Blood:50]  BLOOD ADMINISTERED:none  DRAINS: none   LOCAL MEDICATIONS USED:  MARCAINE     SPECIMEN:  Source of Specimen:  uterus and tubes  DISPOSITION OF SPECIMEN:  PATHOLOGY  COUNTS:  YES  TOURNIQUET:  * No tourniquets in log *  DICTATION: .Dragon Dictation  PLAN OF CARE: Admit for overnight observation  PATIENT DISPOSITION:  PACU - hemodynamically stable.   Delay start of Pharmacological VTE agent (>24hrs) due to surgical blood loss or risk of bleeding: not applicable  The risks, benefits, alternatives of surgery were explained, understood, and accepted. All questions were answered and a consent form was signed. She was taken to the operating room and general anesthesia was applied. She was put in the dorsal lithotomy position. Her vagina and was prepped and draped in the usual sterile fashion. A timeout procedure was done. A bimanual exam revealed a small uterus. A Foley catheter was placed and it drained clear urine throughout the case. The cervix was grasped with a single-tooth tenaculum. A total of 130 cc of dilute Marcaine was injected in a circumferential fashion at the cervicovaginal junction. An incision was made at the site. The posterior peritoneum was entered. A long weighted speculum was placed. The anterior peritoneum was entered. A Deaver was placed anteriorly. The uterosacral ligaments were clamped, cut, and ligated. They were tagged and held. 2 Vicryl sutures used throughout this case unless  otherwise specified. The small uterus was separated from its pelvic attachments using a similar clamp, cut, ligate technique. Excellent hemostasis was maintained throughout. Once the uterus was removed, the bowel was kept out of the operative site with a sponge on a stick. I was able to visualize each adnexa and grabbed the oviducts with a Babcock clamp. Using a Kelly clamp, I was able to clamp, cut, and ligate the mesosalpinx on each side. Both oviducts were removed intact. I saw the Essure in the right tube and in the left tube (portion still attached to the uterus.Hemostasis was maintained. I closed the peritoneum with a 2-0 vicryl running suture. I used the held uterosacral ligament sutures to place a mattress suture at each vaginal cuff angle. I then closed the vaginal cuff with a 0 vicryl suture in a running locking fashion. Excellent hemostasis was noted. She was extubated and taken to the recovery room in stable condition.

## 2013-01-04 NOTE — Anesthesia Postprocedure Evaluation (Signed)
  Anesthesia Post-op Note  Patient: Diana Francis  Procedure(s) Performed: Procedure(s): HYSTERECTOMY VAGINAL with Bilateral Fallopian Tubes (Bilateral)  Patient Location: PACU  Anesthesia Type:General  Level of Consciousness: awake, alert  and oriented  Airway and Oxygen Therapy: Patient Spontanous Breathing  Post-op Pain: mild  Post-op Assessment: Post-op Vital signs reviewed, Patient's Cardiovascular Status Stable, Respiratory Function Stable, Patent Airway, No signs of Nausea or vomiting and Pain level controlled  Post-op Vital Signs: Reviewed and stable  Complications: No apparent anesthesia complications

## 2013-01-05 ENCOUNTER — Other Ambulatory Visit (HOSPITAL_COMMUNITY): Payer: 59

## 2013-01-05 ENCOUNTER — Encounter (HOSPITAL_COMMUNITY): Payer: Self-pay | Admitting: Obstetrics & Gynecology

## 2013-01-05 LAB — CBC
HCT: 32.3 % — ABNORMAL LOW (ref 36.0–46.0)
MCHC: 34.1 g/dL (ref 30.0–36.0)
Platelets: 255 10*3/uL (ref 150–400)
RDW: 14.2 % (ref 11.5–15.5)
WBC: 19.2 10*3/uL — ABNORMAL HIGH (ref 4.0–10.5)

## 2013-01-05 MED ORDER — IBUPROFEN 800 MG PO TABS: 800.0000 mg | ORAL_TABLET | Freq: Three times a day (TID) | ORAL | Status: DC | PRN

## 2013-01-05 NOTE — Progress Notes (Signed)
Pt teaching complete  D/c ambulated out

## 2013-01-05 NOTE — Discharge Summary (Signed)
Physician Discharge Summary  Patient ID: Diana Francis MRN: [id number] DOB/AGE: January 05, 1981 32 y.o.  Admit date: 01/04/2013 Discharge date: 01/05/2013  Admission Diagnoses: Chronic pelvic pain, dysfunctional uterine bleeding  Discharge Diagnoses: same  Active Problems:   Post-operative state   Discharged Condition: good  Hospital Course: She underwent an uncomplicated TVH/BS under general anesthesia. By POD #1she was tolerating po without nausea or vomitting. She was voiding and having flatus. She voiced her readiness to go home.  Consults: None  Significant Diagnostic Studies: labs: Hbg was 11  Treatments: surgery: as  above  Discharge Exam: Blood pressure 133/91, pulse 69, temperature 99 F (37.2 C), temperature source Oral, resp. rate 18, height 5' 4.5" (1.638 m), weight 60.782 kg (134 lb), last menstrual period 12/19/2012, SpO2 100.00%. General appearance: alert Resp: clear to auscultation bilaterally Cardio: regular rate and rhythm, S1, S2 normal, no murmur, click, rub or gallop GI: soft, non-tender; bowel sounds normal; no masses,  no organomegaly  Disposition: 01-Home or Self Care     Medication List         hydrOXYzine 25 MG tablet  Commonly known as:  ATARAX/VISTARIL  Take 1-2 tab at bed time     ibuprofen 800 MG tablet  Commonly known as:  ADVIL,MOTRIN  Take 1 tablet (800 mg total) by mouth every 8 (eight) hours as needed for pain.     ibuprofen 800 MG tablet  Commonly known as:  ADVIL,MOTRIN  Take 1 tablet (800 mg total) by mouth every 8 (eight) hours as needed.     lamoTRIgine 100 MG tablet  Commonly known as:  LAMICTAL  Take 1 tab daily     levothyroxine 100 MCG tablet  Commonly known as:  SYNTHROID, LEVOTHROID  Take 50 mcg by mouth daily before breakfast.     levothyroxine 25 MCG tablet  Commonly known as:  SYNTHROID, LEVOTHROID  Take 25 mcg by mouth daily before breakfast.     omeprazole 20 MG capsule  Commonly known as:  PRILOSEC   Take 20 mg by mouth daily.     oxyCODONE-acetaminophen 5-325 MG per tablet  Commonly known as:  PERCOCET/ROXICET  Take 1 tablet by mouth every 4 (four) hours as needed for severe pain.         Signed: Astra Gregg C. 01/05/2013, 11:57 AM

## 2013-01-06 ENCOUNTER — Other Ambulatory Visit (HOSPITAL_COMMUNITY): Payer: 59

## 2013-01-09 ENCOUNTER — Telehealth (HOSPITAL_COMMUNITY): Payer: Self-pay | Admitting: *Deleted

## 2013-01-09 ENCOUNTER — Other Ambulatory Visit (HOSPITAL_COMMUNITY): Payer: 59

## 2013-01-09 NOTE — Telephone Encounter (Signed)
Called to let her Doctor know that she has a rash on her neck and recently had her dosage on the Lamictal increased. Wants to know what he recommends.

## 2013-01-09 NOTE — Telephone Encounter (Signed)
Called patient back to clarify rash and description. Pt states she went to Urgent care on Saturday 01/07/13 and was told that it was a bug bite. She had put a cream on her neck that cause it to become red/rash like.  The redness has subsided and she has no rash at present. Pt states she is just fine and has no side effects from medication.

## 2013-01-10 ENCOUNTER — Other Ambulatory Visit (HOSPITAL_COMMUNITY): Payer: 59

## 2013-01-11 ENCOUNTER — Other Ambulatory Visit (HOSPITAL_COMMUNITY): Payer: 59

## 2013-01-12 ENCOUNTER — Other Ambulatory Visit (HOSPITAL_COMMUNITY): Payer: 59

## 2013-01-13 ENCOUNTER — Other Ambulatory Visit (HOSPITAL_COMMUNITY): Payer: 59

## 2013-01-23 ENCOUNTER — Ambulatory Visit (INDEPENDENT_AMBULATORY_CARE_PROVIDER_SITE_OTHER): Payer: 59 | Admitting: Psychiatry

## 2013-01-23 DIAGNOSIS — F329 Major depressive disorder, single episode, unspecified: Secondary | ICD-10-CM

## 2013-01-23 NOTE — Progress Notes (Signed)
Patient ID: Diana Francis, female   DOB: [birth year], 32 y.o.   MRN: [id number]  Session Time: 9:00-9:50  Participation Level: Active   Behavioral Response: CasualAlertEuthymic   Type of Therapy: Individual Therapy   Treatment Goals addressed: emotion regulation, stress management   Interventions: CBT, supportive, strength-based   Summary: Diana Francis is a 32 y.o. female who presents with depression and anxiety.   Suicidal/Homicidal: nowithout intent/plan   Therapist Response: Pt. Continues to present with positive mood. Pt. Reports positive experience with IOP, learned coping skills, broadening of perspective by listening to the experiences of others. Pt. Reports good results from medication, acceptance of bipolar I diagnosis. Pt. Also reports excellent recovery from gynecological pain which she attributes to recent hysterectomy. Session focused on continued self-care, resolving adoption disclosure with eldest child, reconciliation with boyfriend.  Plan: Continue with CBT and strength based approach. Pt. Reports that she is doing well with medication. Return again in 2 weeks.   Diagnosis: Axis I: Depressive Disorder NOS   Axis II: deferred   Wynonia Musty  01/23/2013

## 2013-01-30 ENCOUNTER — Ambulatory Visit (INDEPENDENT_AMBULATORY_CARE_PROVIDER_SITE_OTHER): Payer: 59 | Admitting: Psychiatry

## 2013-01-30 ENCOUNTER — Encounter (HOSPITAL_COMMUNITY): Payer: Self-pay | Admitting: Psychiatry

## 2013-01-30 VITALS — BP 108/78 | HR 80 | Ht 64.5 in | Wt 140.4 lb

## 2013-01-30 DIAGNOSIS — F319 Bipolar disorder, unspecified: Secondary | ICD-10-CM

## 2013-01-30 MED ORDER — HYDROXYZINE HCL 50 MG PO TABS: ORAL_TABLET | ORAL | Status: DC

## 2013-01-30 MED ORDER — LAMOTRIGINE 100 MG PO TABS: ORAL_TABLET | ORAL | Status: DC

## 2013-01-30 NOTE — Progress Notes (Signed)
[facility] Progress Note  Diana Francis [id number] 33 y.o.  01/30/2013 10:00 AM  Chief Complaint:  Medication management and followup.  History of Present Illness. Diana Francis came for her followup appointment.  She recently had hysterectomy which is done by Dr Hulan Fray..  She is out of work recommended by her physician .  Psychiatrically she is doing much better.  She has more energy and she is less depressed and less anxious.  She is sleeping better.  She continues to have chronic pain after the surgery but overall she is doing better.  She likes Lamictal.  She has no rash or itching.  She was very concerned because her adopted son find out that he is adopted by his biological sister.  Initially she was very anxious but she is glad that her adopted son took that news very well.  He is adjusting okay.  Patient also endorse much improvement in her emotions.  She is less tearful and less complain of crying.  She takes Vistaril every night to help sleep.  She had good Christmas.  She denies any shakes, tremors.  She denies any anger , irritability or significant mood swing.  She is not drinking or using any illegal substances.  Patient's son is scheduled to see a psychiatrist in this office.  Patient is still on FMLA, she has not back to work since August .  She is on FMLA because of severe physical issues and her psychiatric problems.  Patient is mentally doing better however she continues to have physical symptoms.  She works at [employer] for more than 8 years.  Suicidal Ideation: No Plan Formed: No Patient has means to carry out plan: No  Homicidal Ideation: No Plan Formed: No Patient has means to carry out plan: No  Review of Systems: Psychiatric: Agitation: No Hallucination: No Depressed Mood: Yes Insomnia: No Hypersomnia: No Altered Concentration: No Feels Worthless: No Grandiose Ideas: No Belief In Special Powers: No New/Increased Substance Abuse:  No Compulsions: No  Neurologic: Headache: Yes Seizure: No Paresthesias: No  Medical History;  Patient has hypertension, GERD and hypothyroidism.  She has hysterectomy .  Her primary care physician is Dr. Altha Harm at [facility].  She denies any history of traumatic brain injury, seizures or any concussion.   Outpatient Encounter Prescriptions as of 01/30/2013  Medication Sig  . hydrOXYzine (ATARAX/VISTARIL) 50 MG tablet Take 1 tab at bed time  . lamoTRIgine (LAMICTAL) 100 MG tablet Take 1 tab daily  . levothyroxine (SYNTHROID, LEVOTHROID) 25 MCG tablet Take 25 mcg by mouth daily before breakfast.  . omeprazole (PRILOSEC) 20 MG capsule Take 20 mg by mouth daily.   . [DISCONTINUED] hydrOXYzine (ATARAX/VISTARIL) 25 MG tablet Take 1-2 tab at bed time  . [DISCONTINUED] lamoTRIgine (LAMICTAL) 100 MG tablet Take 1 tab daily  . [DISCONTINUED] ibuprofen (ADVIL,MOTRIN) 800 MG tablet Take 1 tablet (800 mg total) by mouth every 8 (eight) hours as needed for pain.  . [DISCONTINUED] ibuprofen (ADVIL,MOTRIN) 800 MG tablet Take 1 tablet (800 mg total) by mouth every 8 (eight) hours as needed.  . [DISCONTINUED] levothyroxine (SYNTHROID, LEVOTHROID) 100 MCG tablet Take 50 mcg by mouth daily before breakfast.   . [DISCONTINUED] oxyCODONE-acetaminophen (PERCOCET/ROXICET) 5-325 MG per tablet Take 1 tablet by mouth every 4 (four) hours as needed for severe pain.    Recent Results (from the past 2160 hour(s))  CBC     Status: None   Collection Time    11/17/12 11:30 AM  Result Value Range   WBC 8.2  4.0 - 10.5 K/uL   RBC 4.15  3.87 - 5.11 MIL/uL   Hemoglobin 13.3  12.0 - 15.0 g/dL   HCT 37.4  36.0 - 46.0 %   MCV 90.1  78.0 - 100.0 fL   MCH 32.0  26.0 - 34.0 pg   MCHC 35.6  30.0 - 36.0 g/dL   RDW 13.9  11.5 - 15.5 %   Platelets 252  150 - 400 K/uL  PREGNANCY, URINE     Status: None   Collection Time    11/24/12  6:45 AM      Result Value Range   Preg Test, Ur NEGATIVE  NEGATIVE    Comment:            THE SENSITIVITY OF THIS     METHODOLOGY IS >20 mIU/mL.    Past Psychiatric History/Hospitalization(s) Patient has done twice intensive outpatient program .  Patient denies any history of psychiatric inpatient treatment or any suicidal attempt however endorse history of severe mood swings irritability and anger.  She had intensive outpatient program in the past.  She denies any psychosis or any hallucination.  She was referred to [place] from primary care physician who started her on Prozac but she stopped because of significant nausea and vomiting.  In the past she had tried Zoloft, Remeron with limited response.   Anxiety: Yes Bipolar Disorder: No Depression: Yes Mania: No Psychosis: No Schizophrenia: No Personality Disorder: No Hospitalization for psychiatric illness: No History of Electroconvulsive Shock Therapy: No Prior Suicide Attempts: No  Physical Exam: Constitutional:  BP 108/78  Pulse 80  Ht 5' 4.5" (1.638 m)  Wt 140 lb 6.4 oz (63.685 kg)  BMI 23.74 kg/m2  Musculoskeletal: Strength & Muscle Tone: within normal limits Gait & Station: normal Patient leans: N/A  Mental Status Examination;  Patient is a young female who appears thin and casually dressed.  Her speech is clear and coherent.  She describes her mood as good and her affect is improved from the past.  Her thought process is slow but logical and goal directed.  She denies any active or passive suicidal thoughts or homicidal thoughts.  She denies any auditory or visual hallucination.  Her fund of knowledge is average.  There is no paranoia or delusions present at this time.  She's alert and oriented x3.  Her insight judgment and impulse control is okay.   Medical Decision Making (Choose Three): Established Problem, Stable/Improving (1), Review of Psycho-Social Stressors (1), Review of Last Therapy Session (1) and Review of Medication Regimen & Side Effects (2)  Assessment: Axis I: Bipolar  Disorder Axis II: Deferred  Axis III:  Past Medical History  Diagnosis Date  . Thyroid disease   . Hypothyroidism   . GERD (gastroesophageal reflux disease)   . Headache(784.0)   . HTN (hypertension)     no meds- per pt white coat syndrome  . Anemia     history of anemia  . Depression     Bipolar    Axis IV: Moderate   Plan:  Patient is doing better on Lamictal 100 mg daily and Vistaril 50 mg at bedtime.  Her mood swing and anger is improved from the past.  I will continue her current psychotropic medication.  At this time psychiatrically patient is ready to restart the work however she is recommended by her physician who did hysterectomy out of work.  Patient scheduled to see Dr. Hulan Fray on January 20th discuss  more about starting date to work .  I recommend to see therapist for coping and social skills.  Recommend to call us back if she has any question or any concern.  At this time patient does not have any rash or itching.  Followup in 2 months.  We will also get collateral information from Dr. Hulan Fray.  Jens Siems T., MD 01/30/2013

## 2013-02-08 ENCOUNTER — Ambulatory Visit (HOSPITAL_COMMUNITY): Payer: Self-pay | Admitting: Psychiatry

## 2013-02-14 ENCOUNTER — Ambulatory Visit (INDEPENDENT_AMBULATORY_CARE_PROVIDER_SITE_OTHER): Payer: 59 | Admitting: Obstetrics & Gynecology

## 2013-02-14 ENCOUNTER — Encounter: Payer: Self-pay | Admitting: Obstetrics & Gynecology

## 2013-02-14 VITALS — BP 140/87 | HR 102 | Ht 64.0 in | Wt 143.0 lb

## 2013-02-14 DIAGNOSIS — Z09 Encounter for follow-up examination after completed treatment for conditions other than malignant neoplasm: Secondary | ICD-10-CM

## 2013-02-14 NOTE — Progress Notes (Signed)
   Subjective:    Patient ID: Diana Francis, female    DOB: 1980/07/22, 33 y.o.   MRN: [id number]  HPI  33 yo lady now 6 weeks po s/p TVH/BS for CPP. That particular pain has resolved. She still feels occasional ovarian twinges. No sex yet. She went to her primary care MD yesterday for a UTI.   Review of Systems     Objective:   Physical Exam  Well-healed cuff Normal bimanual      Assessment & Plan:  Post op doing well RTC 1 year May return to work.

## 2013-02-27 ENCOUNTER — Ambulatory Visit (INDEPENDENT_AMBULATORY_CARE_PROVIDER_SITE_OTHER): Payer: 59 | Admitting: Psychiatry

## 2013-02-27 DIAGNOSIS — F319 Bipolar disorder, unspecified: Secondary | ICD-10-CM

## 2013-02-27 DIAGNOSIS — F3289 Other specified depressive episodes: Secondary | ICD-10-CM

## 2013-02-27 DIAGNOSIS — F329 Major depressive disorder, single episode, unspecified: Secondary | ICD-10-CM

## 2013-02-27 NOTE — Progress Notes (Signed)
Patient ID: Diana Francis, female   DOB: [birth year], 32 y.o.   MRN: [id number]  Session Time: 3:00-3:50   Participation Level: Active   Behavioral Response: CasualAlertEuthymic   Type of Therapy: Individual Therapy   Treatment Goals addressed: emotion regulation, stress management, coping skills  Interventions: CBT, supportive, strength-based   Summary: Diana Francis is a 33 y.o. female who presents with depression and anxiety.   Suicidal/Homicidal: nowithout intent/plan   Therapist Response: Pt. Continues to present with positive mood. Pt. Reports reduction in major stressors including improvement in relationship with boyfriend, ability to set healthier boundaries at work and in relationship with her supervisor, continued improvement in communication with her mother, reduction in pain attributed to recovery from hysterectomy. Session focused on prioritizing self-care in order to manage future crises.  Plan: Continue with CBT and strength based approach. Pt. Reports that she is doing well with medication. Return again in 4 weeks.   Diagnosis: Axis I: Depressive Disorder NOS   Axis II: deferred   Renford Dills  02/27/2013

## 2013-03-30 ENCOUNTER — Other Ambulatory Visit (HOSPITAL_COMMUNITY): Payer: Self-pay | Admitting: Psychiatry

## 2013-03-30 ENCOUNTER — Ambulatory Visit (HOSPITAL_COMMUNITY): Payer: Self-pay | Admitting: Psychiatry

## 2013-03-30 DIAGNOSIS — F319 Bipolar disorder, unspecified: Secondary | ICD-10-CM

## 2013-04-03 ENCOUNTER — Encounter (HOSPITAL_COMMUNITY): Payer: Self-pay | Admitting: Psychiatry

## 2013-04-03 ENCOUNTER — Ambulatory Visit (INDEPENDENT_AMBULATORY_CARE_PROVIDER_SITE_OTHER): Payer: 59 | Admitting: Psychiatry

## 2013-04-03 VITALS — BP 126/64 | HR 104 | Ht 64.0 in | Wt 158.0 lb

## 2013-04-03 DIAGNOSIS — F319 Bipolar disorder, unspecified: Secondary | ICD-10-CM

## 2013-04-03 MED ORDER — LAMOTRIGINE 100 MG PO TABS: ORAL_TABLET | ORAL | Status: DC

## 2013-04-03 MED ORDER — HYDROXYZINE HCL 50 MG PO TABS: ORAL_TABLET | ORAL | Status: DC

## 2013-04-03 NOTE — Progress Notes (Signed)
[facility] [id number] Progress Note  ARAEYA Francis [id number] 33 y.o.  04/03/2013 4:18 PM  Chief Complaint:  Medication management and followup.  History of Present Illness. Diana Francis came for her followup appointment.  Patient is doing very well on her current medication.  She denies any irritability, anger in a mood swing.  She started working however she continues to have some anxiety about her work.  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  She also gained weight from the past.  She is doing research on her work and believe Synthroid is causing weight gain.  Patient was to continue her Lamictal and Vistaril.  She is sleeping better.  She is seeing Anderson Malta for counseling.  She denies an increase in crying spells.  She has no rash or itching.    Suicidal Ideation: No Plan Formed: No Patient has means to carry out plan: No  Homicidal Ideation: No Plan Formed: No Patient has means to carry out plan: No  Review of Systems: Psychiatric: Agitation: No Hallucination: No Depressed Mood: Yes Insomnia: No Hypersomnia: No Altered Concentration: No Feels Worthless: No Grandiose Ideas: No Belief In Special Powers: No New/Increased Substance Abuse: No Compulsions: No  Neurologic: Headache: Yes Seizure: No Paresthesias: No  Medical History;  Patient has hypertension, GERD and hypothyroidism.  She has hysterectomy .  Her primary care physician is Dr. Altha Harm at [facility].  She denies any history of traumatic brain injury, seizures or any concussion.   Outpatient Encounter Prescriptions as of 04/03/2013  Medication Sig  . hydrOXYzine (ATARAX/VISTARIL) 50 MG tablet Take 1 tab at bed time  . ibuprofen (ADVIL,MOTRIN) 800 MG tablet   . lamoTRIgine (LAMICTAL) 100 MG tablet TAKE 1 TABLET BY MOUTH EVERY DAY  . levothyroxine (SYNTHROID, LEVOTHROID) 25 MCG tablet Take 25 mcg by mouth daily before breakfast.  . omeprazole (PRILOSEC) 20 MG capsule Take  20 mg by mouth daily.   Marland Kitchen sulfamethoxazole-trimethoprim (BACTRIM DS) 800-160 MG per tablet   . Vitamin D, Ergocalciferol, (DRISDOL) 50000 UNITS CAPS capsule   . [DISCONTINUED] hydrOXYzine (ATARAX/VISTARIL) 50 MG tablet Take 1 tab at bed time  . [DISCONTINUED] lamoTRIgine (LAMICTAL) 100 MG tablet TAKE 1 TABLET BY MOUTH EVERY DAY    Past Psychiatric History/Hospitalization(s) Patient has done twice intensive outpatient program .  Patient denies any history of psychiatric inpatient treatment or any suicidal attempt however endorse history of severe mood swings irritability and anger.  She had intensive outpatient program in the past.  She denies any psychosis or any hallucination.  She was referred to [place] from primary care physician who started her on Prozac but she stopped because of significant nausea and vomiting.  In the past she had tried Zoloft, Remeron with limited response.   Anxiety: Yes Bipolar Disorder: No Depression: Yes Mania: No Psychosis: No Schizophrenia: No Personality Disorder: No Hospitalization for psychiatric illness: No History of Electroconvulsive Shock Therapy: No Prior Suicide Attempts: No  Physical Exam: Constitutional:  BP 126/64  Pulse 104  Ht 5\' 4"  (1.626 m)  Wt 158 lb (71.668 kg)  BMI 27.11 kg/m2  LMP 12/19/2012  Musculoskeletal: Strength & Muscle Tone: within normal limits Gait & Station: normal Patient leans: N/A  Mental Status Examination;  Patient is casually dressed and groomed.  Her speech is clear and coherent.  She describes her mood as good and her affect is mood appropriate.  Her thought process is slow but logical and goal directed.  She denies any active or  passive suicidal thoughts or homicidal thoughts.  She denies any auditory or visual hallucination.  Her fund of knowledge is average.  There is no paranoia or delusions present at this time.  She's alert and oriented x3.  Her insight judgment and impulse control is okay.  Established  Problem, Stable/Improving (1), Review of Psycho-Social Stressors (1), Review of Last Therapy Session (1) and Review of Medication Regimen & Side Effects (2)  Assessment: Axis I: Bipolar Disorder Axis II: Deferred  Axis III:  Past Medical History  Diagnosis Date  . Thyroid disease   . Hypothyroidism   . GERD (gastroesophageal reflux disease)   . Headache(784.0)   . HTN (hypertension)     no meds- per pt white coat syndrome  . Anemia     history of anemia  . Depression     Bipolar    Axis IV: Moderate   Plan:  Patient is doing better on Lamictal 100 mg daily and Vistaril 50 mg at bedtime.  Recommend to continue her current psychotropic medication.  Patient will discuss with her primary care physician about weight gain.  Recommend to see Anderson Malta for counseling.  Followup in 3 months.  ARFEEN,SYED T., MD 04/03/2013

## 2013-04-19 ENCOUNTER — Ambulatory Visit (HOSPITAL_COMMUNITY): Payer: 59 | Admitting: Psychiatry

## 2013-04-19 DIAGNOSIS — F319 Bipolar disorder, unspecified: Secondary | ICD-10-CM

## 2013-04-20 NOTE — Progress Notes (Signed)
   THERAPIST PROGRESS NOTE Session Time: 3:30-4:30   Participation Level: Active   Behavioral Response: CasualAlertEuthymic   Type of Therapy: Individual Therapy   Treatment Goals addressed: emotion regulation, stress management, coping skills   Interventions: CBT, supportive, strength-based   Summary: CLARECE DRZEWIECKI is a 33 y.o. female who presents with depression and anxiety.   Suicidal/Homicidal: nowithout intent/plan   Therapist Response: Pt. Continues to present with positive mood. Pt. Reports that she continues to make progress at work and progress in the quality of communication with her mother. Pt. Reports that she intends to end relationship with her boyfriend of 3 years. Session focused on rational and emotional reasons for leaving the relationship, use of "Wise Mind" model to evaluate whether or not decision to leave boyfriend was impulsive and reckless or in her best interest of herself and her children. Pt. Reports mild concern about weight gain, but understands that it is probable side effect of the medication and need to adopt consistent exercise and healthy eating habits.  Plan: Continue with CBT and strength based approach. Pt. Reports that she is doing well with medication. Return again in 4 weeks.   Diagnosis: Axis I: Depressive Disorder NOS   Axis II: deferred     Renford Dills 04/20/2013

## 2013-06-16 ENCOUNTER — Ambulatory Visit (INDEPENDENT_AMBULATORY_CARE_PROVIDER_SITE_OTHER): Payer: 59 | Admitting: Psychiatry

## 2013-06-16 DIAGNOSIS — F319 Bipolar disorder, unspecified: Secondary | ICD-10-CM

## 2013-06-16 NOTE — Progress Notes (Signed)
   THERAPIST PROGRESS NOTE  Session Time: 3:30-4:30   Participation Level: Active   Behavioral Response: CasualAlertEuthymic   Type of Therapy: Individual Therapy   Treatment Goals addressed: emotion regulation, stress management, coping skills   Interventions: CBT, supportive, strength-based   Summary: Diana Francis is a 33 y.o. female who presents with depression and anxiety.   Suicidal/Homicidal: nowithout intent/plan   Therapist Response: Pt. Continues to present with positive mood, bright affect, calm, talkative, good eye contact. Pt. Reports that she continues to do well at work and is being considered for a raise. Pt. Reports that she ended relationship of 3 years and is now living along with her 3 children. Pt. Reports that she is happy without the relationship. Pt. Reports that her children are doing well and she is encouraged by good relationship with her mother. Majority of session spent reviewing self-care. Pt. 's self-care is adequate. Pt. Would like to become more physically active. Discussed exploring interests and developing hobbies.  Plan: Continue with CBT and strength based approach. Pt. Reports that she continues to do well with medication, inquired as to whether she should consider discontinuing medication. Suggested that she discuss medication with Dr. Adele Schilder, but agreed that she is doing well on current regimen. Return again in 4 weeks.   Diagnosis: Axis I: Depressive Disorder NOS   Axis II: deferred   Renford Dills 06/16/2013

## 2013-07-04 ENCOUNTER — Encounter (HOSPITAL_COMMUNITY): Payer: Self-pay | Admitting: Psychiatry

## 2013-07-04 ENCOUNTER — Ambulatory Visit (INDEPENDENT_AMBULATORY_CARE_PROVIDER_SITE_OTHER): Payer: 59 | Admitting: Psychiatry

## 2013-07-04 VITALS — BP 138/84 | HR 92 | Ht 64.5 in | Wt 168.8 lb

## 2013-07-04 DIAGNOSIS — F319 Bipolar disorder, unspecified: Secondary | ICD-10-CM

## 2013-07-04 MED ORDER — LAMOTRIGINE 25 MG PO TABS: ORAL_TABLET | ORAL | Status: DC

## 2013-07-04 MED ORDER — HYDROXYZINE HCL 50 MG PO TABS: ORAL_TABLET | ORAL | Status: DC

## 2013-07-04 NOTE — Progress Notes (Signed)
[place] [phone] Progress Note  Diana Francis [id number] 33 y.o.  07/04/2013 4:42 PM  Chief Complaint:  Medication management and followup.  History of Present Illness. Diana Francis came for her followup appointment.  Patient is doing very well on her current medication.  She wants to try lower dose Lamictal and eventually come off from Lamictal.  She is very happy because she got promoted at work.  She wants to try without medication in the future to be with her depression and issues.  She is seeing Anderson Malta and reported counseling going very well.  She denies irritability, anger, mood swing or any hallucination.  She is sleeping good.  She denies any recent crying spells.  She has gained weight she is happy that she gained weight.  She is not drinking or using any illicit substances.  She has no rash or itching.  Suicidal Ideation: No Plan Formed: No Patient has means to carry out plan: No  Homicidal Ideation: No Plan Formed: No Patient has means to carry out plan: No  Review of Systems: Psychiatric: Agitation: No Hallucination: No Depressed Mood: No Insomnia: No Hypersomnia: No Altered Concentration: No Feels Worthless: No Grandiose Ideas: No Belief In Special Powers: No New/Increased Substance Abuse: No Compulsions: No  Neurologic: Headache: Yes Seizure: No Paresthesias: No  Medical History;  Patient has hypertension, GERD and hypothyroidism.  She has hysterectomy .  Her primary care physician is Dr. Altha Harm at [facility].  She denies any history of traumatic brain injury, seizures or any concussion.   Outpatient Encounter Prescriptions as of 07/04/2013  Medication Sig  . hydrOXYzine (ATARAX/VISTARIL) 50 MG tablet Take 1 tab at bed time  . lamoTRIgine (LAMICTAL) 25 MG tablet TAKE 2 TABLET BY MOUTH EVERY DAY  . levothyroxine (SYNTHROID, LEVOTHROID) 25 MCG tablet Take 25 mcg by mouth daily before breakfast.  . Vitamin D, Ergocalciferol, (DRISDOL)  50000 UNITS CAPS capsule   . [DISCONTINUED] hydrOXYzine (ATARAX/VISTARIL) 50 MG tablet Take 1 tab at bed time  . [DISCONTINUED] ibuprofen (ADVIL,MOTRIN) 800 MG tablet   . [DISCONTINUED] lamoTRIgine (LAMICTAL) 100 MG tablet TAKE 1 TABLET BY MOUTH EVERY DAY  . [DISCONTINUED] omeprazole (PRILOSEC) 20 MG capsule Take 20 mg by mouth daily.   . [DISCONTINUED] sulfamethoxazole-trimethoprim (BACTRIM DS) 800-160 MG per tablet     Past Psychiatric History/Hospitalization(s) Patient has done twice intensive outpatient program .  Patient denies any history of psychiatric inpatient treatment or any suicidal attempt however endorse history of severe mood swings irritability and anger.  She had intensive outpatient program in the past.  She denies any psychosis or any hallucination.  She was referred to [place] from primary care physician who started her on Prozac but she stopped because of significant nausea and vomiting.  In the past she had tried Zoloft, Remeron with limited response.   Anxiety: Yes Bipolar Disorder: No Depression: Yes Mania: No Psychosis: No Schizophrenia: No Personality Disorder: No Hospitalization for psychiatric illness: No History of Electroconvulsive Shock Therapy: No Prior Suicide Attempts: No  Physical Exam: Constitutional:  BP 138/84  Pulse 92  Ht 5' 4.5" (1.638 m)  Wt 168 lb 12.8 oz (76.567 kg)  BMI 28.54 kg/m2  LMP 12/19/2012  Musculoskeletal: Strength & Muscle Tone: within normal limits Gait & Station: normal Patient leans: N/A  Mental Status Examination;  Patient is casually dressed and groomed.  Her speech is clear and coherent.  She describes her mood as good and her affect is mood appropriate.  Her thought process is slow  but logical and goal directed.  She denies any active or passive suicidal thoughts or homicidal thoughts.  She denies any auditory or visual hallucination.  Her fund of knowledge is average.  There is no paranoia or delusions present at this time.   She's alert and oriented x3.  Her insight judgment and impulse control is okay.  Established Problem, Stable/Improving (1), Review of Psycho-Social Stressors (1), Review of Last Therapy Session (1), Review of Medication Regimen & Side Effects (2) and Review of New Medication or Change in Dosage (2)  Assessment: Axis I: Bipolar Disorder Axis II: Deferred  Axis III:  Past Medical History  Diagnosis Date  . Thyroid disease   . Hypothyroidism   . GERD (gastroesophageal reflux disease)   . Headache(784.0)   . HTN (hypertension)     no meds- per pt white coat syndrome  . Anemia     history of anemia  . Depression     Bipolar    Axis IV: Moderate   Plan:  Patient is doing better on Lamictal 100 mg daily and Vistaril 50 mg at bedtime.  However she wants to try, from Lamictal and I can reduce the dose to 50 mg and to be discontinued in next 3 months.  She'll also continue Vistaril because it is helping her anxiety and helping her sleep.  We will try Lamictal 50 mg however I explained that if she feels worsening of the symptoms that she should call as an start taking 100 mg daily.  Continue Vistaril at present dose.  Continue counseling.  I will see her again in 3 months.    Jianni Shelden T., MD 07/04/2013

## 2013-07-20 ENCOUNTER — Ambulatory Visit (HOSPITAL_COMMUNITY): Payer: Self-pay | Admitting: Psychiatry

## 2013-07-27 ENCOUNTER — Other Ambulatory Visit (HOSPITAL_COMMUNITY): Payer: Self-pay | Admitting: Psychiatry

## 2013-08-17 ENCOUNTER — Ambulatory Visit (HOSPITAL_COMMUNITY): Payer: Self-pay | Admitting: Psychiatry

## 2013-09-06 ENCOUNTER — Ambulatory Visit (INDEPENDENT_AMBULATORY_CARE_PROVIDER_SITE_OTHER): Payer: 59 | Admitting: Psychiatry

## 2013-09-06 DIAGNOSIS — F319 Bipolar disorder, unspecified: Secondary | ICD-10-CM

## 2013-09-07 NOTE — Progress Notes (Signed)
   THERAPIST PROGRESS NOTE Session Time: 3:30-4:30   Participation Level: Active   Behavioral Response: CasualAlertEuthymic   Type of Therapy: Individual Therapy   Treatment Goals addressed: emotion regulation, stress management, coping skills   Interventions: CBT, supportive, strength-based   Summary: Diana Francis is a 33 y.o. female who presents with depression and anxiety.   Suicidal/Homicidal: nowithout intent/plan   Therapist Response: Pt. Continues to present with positive mood, bright affect, calm, talkative, good eye contact. Pt. Reports that she has been able to successfully reduce her medication in consultation with Dr. Adele Schilder. Pt. Reports that her work is going well and she continues to transition with minimal stress to full-time work schedule. Pt. Reports that she was able to end her relationship successfully and that her ex-boyfriend have been able to continue to have a good work relationship. Pt. Reports that her three children are healthy and she is challenged by her eldest son who is 39 years old pushing boundaries, but she understands that this is developmentally normal. Pt. Reports that her eating habits have improved and that she has been walking approximately 3 miles a day in an effort to become more physically active. Pt. Reports that her relationship with her mother continues to improve as they negotiate healthier relationship boundaries between adult daughter and mother.   Plan: Continue with CBT and strength based approach. Pt. Reports that she continues to do well with medication, inquired as to whether she should consider discontinuing medication. Suggested that she discuss medication with Dr. Adele Schilder, but agreed that she is doing well on current regimen. Return again in 4 weeks.   Diagnosis: Axis I: Depressive Disorder NOS  Axis II: deferred     Renford Dills 09/07/2013

## 2013-09-28 ENCOUNTER — Other Ambulatory Visit (HOSPITAL_COMMUNITY): Payer: Self-pay | Admitting: Psychiatry

## 2013-10-02 ENCOUNTER — Other Ambulatory Visit (HOSPITAL_COMMUNITY): Payer: Self-pay | Admitting: Psychiatry

## 2013-10-02 NOTE — Telephone Encounter (Signed)
Patient has appointment on 10/04/13.  Will discuss the dose on her appointment.

## 2013-10-04 ENCOUNTER — Ambulatory Visit (INDEPENDENT_AMBULATORY_CARE_PROVIDER_SITE_OTHER): Payer: 59 | Admitting: Psychiatry

## 2013-10-04 ENCOUNTER — Encounter (HOSPITAL_COMMUNITY): Payer: Self-pay | Admitting: Psychiatry

## 2013-10-04 VITALS — BP 118/76 | HR 90 | Ht 65.0 in | Wt 178.0 lb

## 2013-10-04 DIAGNOSIS — F319 Bipolar disorder, unspecified: Secondary | ICD-10-CM

## 2013-10-04 MED ORDER — LAMOTRIGINE 25 MG PO TABS: ORAL_TABLET | ORAL | Status: DC

## 2013-10-04 MED ORDER — HYDROXYZINE HCL 50 MG PO TABS: ORAL_TABLET | ORAL | Status: DC

## 2013-10-04 NOTE — Progress Notes (Signed)
[place] Progress Note  Diana Francis [id number] 33 y.o.  10/04/2013 4:25 PM  Chief Complaint:  Medication management and followup.  History of Present Illness. [name] came for her followup appointment.  She is taking her medication and denies any side effects.  She takes Lamictal 50 mg daily and Vistaril as needed for insomnia.  She denies any itching, rash, tremors or shakes.  Her sleep is good.  She is very happy with her job.  She is working at [employer] .  She is seeing Anderson Malta for counseling at this office.  She denies any recent agitation, anger, mood swings.  Since she had hysterectomy she has seen much improvement in her mood .  She is less complaining of pain .  She is wondering if she can reduce Lamictal on her next appointment.  She wants to come off of psychotropic medication in the future.  Her appetite is okay.  She has gained weight from the past however her vitals are stable.  She is not drinking or using any illegal substances.  Suicidal Ideation: No Plan Formed: No Patient has means to carry out plan: No  Homicidal Ideation: No Plan Formed: No Patient has means to carry out plan: No  Review of Systems: Psychiatric: Agitation: No Hallucination: No Depressed Mood: No Insomnia: No Hypersomnia: No Altered Concentration: No Feels Worthless: No Grandiose Ideas: No Belief In Special Powers: No New/Increased Substance Abuse: No Compulsions: No  Neurologic: Headache: Yes Seizure: No Paresthesias: No  Medical History;  Patient has hypertension, GERD and hypothyroidism.  She has hysterectomy .  Her primary care physician is Dr. Altha Harm at [facility].  She denies any history of traumatic brain injury, seizures or any concussion.   Outpatient Encounter Prescriptions as of 10/04/2013  Medication Sig  . hydrOXYzine (ATARAX/VISTARIL) 50 MG tablet Take 1 tab at bed time  . lamoTRIgine (LAMICTAL) 25 MG tablet TAKE 2 TABLET BY  MOUTH EVERY DAY  . levothyroxine (SYNTHROID, LEVOTHROID) 25 MCG tablet Take 25 mcg by mouth daily before breakfast.  . Vitamin D, Ergocalciferol, (DRISDOL) 50000 UNITS CAPS capsule   . [DISCONTINUED] hydrOXYzine (ATARAX/VISTARIL) 50 MG tablet Take 1 tab at bed time  . [DISCONTINUED] lamoTRIgine (LAMICTAL) 25 MG tablet TAKE 2 TABLET BY MOUTH EVERY DAY    Past Psychiatric History/Hospitalization(s) Patient has done twice intensive outpatient program .  Patient denies any history of psychiatric inpatient treatment or any suicidal attempt however endorse history of severe mood swings irritability and anger.  She had intensive outpatient program in the past.  She denies any psychosis or any hallucination.  She was referred to [place] from primary care physician who started her on Prozac but she stopped because of significant nausea and vomiting.  In the past she had tried Zoloft, Remeron with limited response.   Anxiety: Yes Bipolar Disorder: No Depression: Yes Mania: No Psychosis: No Schizophrenia: No Personality Disorder: No Hospitalization for psychiatric illness: No History of Electroconvulsive Shock Therapy: No Prior Suicide Attempts: No  Physical Exam: Constitutional:  BP 118/76  Pulse 90  Ht 5\' 5"  (1.651 m)  Wt 178 lb (80.74 kg)  BMI 29.62 kg/m2  LMP 12/19/2012  Musculoskeletal: Strength & Muscle Tone: within normal limits Gait & Station: normal Patient leans: N/A  Mental Status Examination;  Patient is casually dressed and groomed.  Her speech is clear and coherent.  She describes her mood as good and her affect is mood appropriate.  Her thought process is slow but logical  and goal directed.  She denies any active or passive suicidal thoughts or homicidal thoughts.  She denies any auditory or visual hallucination.  Her fund of knowledge is average.  There is no paranoia or delusions present at this time.  She's alert and oriented x3.  Her insight judgment and impulse control is  okay.  Established Problem, Stable/Improving (1), Review of Last Therapy Session (1) and Review of Medication Regimen & Side Effects (2)  Assessment: Axis I: Bipolar Disorder Axis II: Deferred  Axis III:  Past Medical History  Diagnosis Date  . Thyroid disease   . Hypothyroidism   . GERD (gastroesophageal reflux disease)   . Headache(784.0)   . HTN (hypertension)     no meds- per pt white coat syndrome  . Anemia     history of anemia  . Depression     Bipolar    Axis IV: Moderate   Plan:  Patient is doing better on Lamictal 50 mg and Vistaril 50 mg as needed.  I encouraged to keep her current medication and continue to see therapist for coping and social skills.  Patient liked to cut down her Lamictal on her next visit .  I agreed we will try reducing the Lamictal does on her next visit if she continues to show improvement.  Encourage watching her calorie intake and regular exercise for weight loss.  Recommended to call us back if she has any question or any concern.  Followup in 3 months.  ARFEEN,SYED T., MD 10/04/2013

## 2013-12-29 ENCOUNTER — Ambulatory Visit (INDEPENDENT_AMBULATORY_CARE_PROVIDER_SITE_OTHER): Payer: 59 | Admitting: Psychiatry

## 2013-12-29 DIAGNOSIS — F319 Bipolar disorder, unspecified: Secondary | ICD-10-CM

## 2014-01-01 NOTE — Progress Notes (Signed)
   THERAPIST PROGRESS NOTE  Session Time: 3:00-4:00  Participation Level: Active  Behavioral Response: CasualAlertEuthymic   Type of Therapy: Individual Therapy   Treatment Goals addressed: emotion regulation, stress management, coping skills   Interventions: CBT, supportive, strength-based   Summary: Diana Francis is a 32 y.o. female who presents with depression and anxiety.   Suicidal/Homicidal: nowithout intent/plan   Therapist Response: Pt. Continues to present with positive mood, bright affect, calm, talkative, good eye contact. Pt. Reports that she is still planning to discontinue her medication, but has not done so and continuing to see Dr. Adele Schilder. Pt. Reports that her work environment is positive and being considered for a promotion and a raise. Pt. Reports that her children are her major stressor describing them as "spoiled". Pt. Acknowledges her responsibility in her children's behavior and believes that she has over-indulged her children in an effort to over compensate because she is a single parent. Pt. Reports some minor stress with her mother who does not support her child rearing strategies at times, but Pt. Is appropriately in asserting her intentions for her children. Pt. Reports that she is not currently in a relationship, but is happy and looking forward to traveling with friends in the spring and summer. Pt. Reports that she has been walking daily and has lost 25 pounds since our last session and happy with changes she has made to improve her physical and mental health.   Plan: Continue with CBT and strength based approach. Return again in 4 weeks.   Diagnosis: Axis I: Depressive Disorder NOS  Axis II: deferred     Nancie Neas, [facility] 01/01/2014

## 2014-01-03 ENCOUNTER — Encounter (HOSPITAL_COMMUNITY): Payer: Self-pay | Admitting: Psychiatry

## 2014-01-03 ENCOUNTER — Ambulatory Visit (INDEPENDENT_AMBULATORY_CARE_PROVIDER_SITE_OTHER): Payer: 59 | Admitting: Psychiatry

## 2014-01-03 VITALS — BP 133/89 | HR 107 | Ht 65.0 in | Wt 158.8 lb

## 2014-01-03 DIAGNOSIS — F319 Bipolar disorder, unspecified: Secondary | ICD-10-CM

## 2014-01-03 MED ORDER — LAMOTRIGINE 25 MG PO TABS: ORAL_TABLET | ORAL | Status: DC

## 2014-01-03 NOTE — Progress Notes (Signed)
Diana Francis [phone] Progress Note  Diana Francis [id number] 33 y.o.  01/03/2014 4:07 PM  Chief Complaint:  Medication management and followup.  History of Present Illness. Diana Francis came for her followup appointment.  She is doing better and wondering if she can come off from Lamictal.  She denies any recent crying spells and irritability anger or any mood swing.  She reported her job is going very well and recently she is moved to a new department and she is very happy about it.  She is seeing Anderson Malta for counseling.  She has not taken Vistaril in recent months.  She had a good Thanksgiving.  Her appetite is okay.  She is taking weight loss medication and she has lost more than 10 pounds in recent weeks.  Her energy level is good.  She sleeping good and denies any racing thoughts.  Her vitals are stable.  Patient denies drinking or using any illegal substances.    Suicidal Ideation: No Plan Formed: No Patient has means to carry out plan: No  Homicidal Ideation: No Plan Formed: No Patient has means to carry out plan: No  Review of Systems: Psychiatric: Agitation: No Hallucination: No Depressed Mood: No Insomnia: No Hypersomnia: No Altered Concentration: No Feels Worthless: No Grandiose Ideas: No Belief In Special Powers: No New/Increased Substance Abuse: No Compulsions: No  Neurologic: Headache: Yes Seizure: No Paresthesias: No  Medical History;  Patient has hypertension, GERD and hypothyroidism.  She has hysterectomy .  Her primary care physician is Dr. Altha Harm at [facility].  She denies any history of traumatic brain injury, seizures or any concussion.   Outpatient Encounter Prescriptions as of 01/03/2014  Medication Sig  . hydrOXYzine (ATARAX/VISTARIL) 50 MG tablet Take 1 tab at bed time  . lamoTRIgine (LAMICTAL) 25 MG tablet TAKE 2 TABLET BY MOUTH EVERY DAY  . QSYMIA 7.5-46 MG CP24 Take 1 capsule by mouth every morning.  . Vitamin D,  Ergocalciferol, (DRISDOL) 50000 UNITS CAPS capsule   . [DISCONTINUED] lamoTRIgine (LAMICTAL) 25 MG tablet TAKE 2 TABLET BY MOUTH EVERY DAY  . [DISCONTINUED] levothyroxine (SYNTHROID, LEVOTHROID) 25 MCG tablet Take 25 mcg by mouth daily before breakfast.    Past Psychiatric History/Hospitalization(s) Patient has done twice intensive outpatient program .  Patient denies any history of psychiatric inpatient treatment or any suicidal attempt however endorse history of mood swings irritability and anger.  She denies any psychosis or any hallucination.  She was referred to [place] from primary care physician who started her on Prozac but she stopped because of significant nausea and vomiting.  In the past she had tried Zoloft, Remeron with limited response.   Anxiety: Yes Bipolar Disorder: No Depression: Yes Mania: No Psychosis: No Schizophrenia: No Personality Disorder: No Hospitalization for psychiatric illness: No History of Electroconvulsive Shock Therapy: No Prior Suicide Attempts: No  Physical Exam: Constitutional:  BP 133/89 mmHg  Pulse 107  Ht 5\' 5"  (1.651 m)  Wt 158 lb 12.8 oz (72.031 kg)  BMI 26.43 kg/m2  LMP 12/19/2012  Musculoskeletal: Strength & Muscle Tone: within normal limits Gait & Station: normal Patient leans: N/A  Mental Status Examination;  Patient is casually dressed and groomed.  Her speech is clear and coherent.  She describes her mood as good and her affect is mood appropriate.  Her thought process is slow but logical and goal directed.  She denies any active or passive suicidal thoughts or homicidal thoughts.  She denies any auditory or visual hallucination.  Her fund of  knowledge is average.  There is no paranoia or delusions present at this time.  She's alert and oriented x3.  Her insight judgment and impulse control is okay.  Established Problem, Stable/Improving (1), Review of Last Therapy Session (1), Review of Medication Regimen & Side Effects (2) and Review of  New Medication or Change in Dosage (2)  Assessment: Axis I: Bipolar Disorder Axis II: Deferred  Axis III:  Past Medical History  Diagnosis Date  . Thyroid disease   . Hypothyroidism   . GERD (gastroesophageal reflux disease)   . Headache(784.0)   . HTN (hypertension)     no meds- per pt white coat syndrome  . Anemia     history of anemia  . Depression     Bipolar    Axis IV: Moderate   Plan:  Patient wants to come off from Lamictal in the future.  I recommended to take Lamictal 25 mg daily for 2 weeks and gradually decreased however she wants to keep the prescription 50 mg chest in case if she feels worsening of depression.  Discuss noncompliance with medication may cause worsening and relapsed into illness however she believe most of her symptoms were due to her physical illness and now she is feeling better.  However she like to continue to see Anderson Malta for counseling.  I will see her again in 2 months.  Recommended to call us back if she has any question, concern or if she feels worsening of the symptoms.  A new prescription of Lamictal is given however she does not need Vistaril prescription at this time.   ARFEEN,SYED T., MD 01/03/2014

## 2014-02-16 ENCOUNTER — Ambulatory Visit: Payer: Self-pay | Admitting: Obstetrics & Gynecology

## 2014-03-02 ENCOUNTER — Ambulatory Visit (INDEPENDENT_AMBULATORY_CARE_PROVIDER_SITE_OTHER): Payer: 59 | Admitting: Family Medicine

## 2014-03-02 ENCOUNTER — Encounter: Payer: Self-pay | Admitting: Family Medicine

## 2014-03-02 VITALS — BP 138/93 | HR 80 | Ht 64.0 in | Wt 167.0 lb

## 2014-03-02 DIAGNOSIS — Z01419 Encounter for gynecological examination (general) (routine) without abnormal findings: Secondary | ICD-10-CM

## 2014-03-02 DIAGNOSIS — Z113 Encounter for screening for infections with a predominantly sexual mode of transmission: Secondary | ICD-10-CM

## 2014-03-02 NOTE — Progress Notes (Signed)
  Subjective:     Diana Francis is a 34 y.o. female and is here for a comprehensive physical exam. The patient reports no problems. Single female--would like STD testing. S/p TVH.   History   Social History  . Marital Status: Divorced    Spouse Name: N/A    Number of Children: N/A  . Years of Education: N/A   Occupational History  . Not on file.   Social History Main Topics  . Smoking status: Current Every Day Smoker -- 0.50 packs/day    Types: Cigarettes  . Smokeless tobacco: Never Used  . Alcohol Use: No  . Drug Use: No  . Sexual Activity: Not Currently    Birth Control/ Protection: Surgical   Other Topics Concern  . Not on file   Social History Narrative   Health Maintenance  Topic Date Due  . Samul Dada  09/10/1998  . INFLUENZA VACCINE  08/26/2013  . PAP SMEAR  11/03/2015    The following portions of the patient's history were reviewed and updated as appropriate: allergies, current medications, past family history, past medical history, past social history, past surgical history and problem list.  Review of Systems A comprehensive review of systems was negative.   Objective:    BP 138/93 mmHg  Pulse 80  Ht 5\' 4"  (1.626 m)  Wt 167 lb (75.751 kg)  BMI 28.65 kg/m2  LMP 12/19/2012 General appearance: alert, cooperative and appears stated age Head: Normocephalic, without obvious abnormality, atraumatic Neck: no adenopathy, supple, symmetrical, trachea midline and thyroid not enlarged, symmetric, no tenderness/mass/nodules Lungs: clear to auscultation bilaterally Breasts: normal appearance, no masses or tenderness Heart: regular rate and rhythm, S1, S2 normal, no murmur, click, rub or gallop Abdomen: soft, non-tender; bowel sounds normal; no masses,  no organomegaly Pelvic: external genitalia normal, no adnexal masses or tenderness, uterus surgically absent and vagina normal without discharge Extremities: extremities normal, atraumatic, no cyanosis or  edema Pulses: 2+ and symmetric Skin: Skin color, texture, turgor normal. No rashes or lesions Lymph nodes: Cervical, supraclavicular, and axillary nodes normal. Neurologic: Grossly normal    Assessment:    Healthy female exam.      Plan:   Problem List Items Addressed This Visit    None    Visit Diagnoses    Screen for STD (sexually transmitted disease)    -  Primary    Relevant Orders    GC/Chlamydia Probe Amp    TSH    HIV antibody    RPR    Hepatitis B surface antigen    Hepatitis C antibody     Declines flu Has yearly exam with her primary MD.    See After Visit Summary for Counseling Recommendations

## 2014-03-02 NOTE — Patient Instructions (Signed)
Preventive Care for Adults A healthy lifestyle and preventive care can promote health and wellness. Preventive health guidelines for women include the following key practices.  A routine yearly physical is a good way to check with your health care provider about your health and preventive screening. It is a chance to share any concerns and updates on your health and to receive a thorough exam.  Visit your dentist for a routine exam and preventive care every 6 months. Brush your teeth twice a day and floss once a day. Good oral hygiene prevents tooth decay and gum disease.  The frequency of eye exams is based on your age, health, family medical history, use of contact lenses, and other factors. Follow your health care provider's recommendations for frequency of eye exams.  Eat a healthy diet. Foods like vegetables, fruits, whole grains, low-fat dairy products, and lean protein foods contain the nutrients you need without too many calories. Decrease your intake of foods high in solid fats, added sugars, and salt. Eat the right amount of calories for you.Get information about a proper diet from your health care provider, if necessary. Regular physical exercise is one of the most important things you can do forSmoking Cessation Quitting smoking is important to your health and has many advantages. However, it is not always easy to quit since nicotine is a very addictive drug. Oftentimes, people try 3 times or more before being able to quit. This document explains the best ways for you to prepare to quit smoking. Quitting takes hard work and a lot of effort, but you can do it. ADVANTAGES OF QUITTING SMOKING You will live longer, feel better, and live better. Your body will feel the impact of quitting smoking almost immediately. Within 20 minutes, blood pressure decreases. Your pulse returns to its normal level. After 8 hours, carbon monoxide levels in the blood return to normal. Your oxygen level  increases. After 24 hours, the chance of having a heart attack starts to decrease. Your breath, hair, and body stop smelling like smoke. After 48 hours, damaged nerve endings begin to recover. Your sense of taste and smell improve. After 72 hours, the body is virtually free of nicotine. Your bronchial tubes relax and breathing becomes easier. After 2 to 12 weeks, lungs can hold more air. Exercise becomes easier and circulation improves. The risk of having a heart attack, stroke, cancer, or lung disease is greatly reduced. After 1 year, the risk of coronary heart disease is cut in half. After 5 years, the risk of stroke falls to the same as a nonsmoker. After 10 years, the risk of lung cancer is cut in half and the risk of other cancers decreases significantly. After 15 years, the risk of coronary heart disease drops, usually to the level of a nonsmoker. If you are pregnant, quitting smoking will improve your chances of having a healthy baby. The people you live with, especially any children, will be healthier. You will have extra money to spend on things other than cigarettes. QUESTIONS TO THINK ABOUT BEFORE ATTEMPTING TO QUIT You may want to talk about your answers with your health care provider. Why do you want to quit? If you tried to quit in the past, what helped and what did not? What will be the most difficult situations for you after you quit? How will you plan to handle them? Who can help you through the tough times? Your family? Friends? A health care provider? What pleasures do you get from smoking? What ways  can you still get pleasure if you quit? Here are some questions to ask your health care provider: How can you help me to be successful at quitting? What medicine do you think would be best for me and how should I take it? What should I do if I need more help? What is smoking withdrawal like? How can I get information on withdrawal? GET READY Set a quit date. Change your  environment by getting rid of all cigarettes, ashtrays, matches, and lighters in your home, car, or work. Do not let people smoke in your home. Review your past attempts to quit. Think about what worked and what did not. GET SUPPORT AND ENCOURAGEMENT You have a better chance of being successful if you have help. You can get support in many ways. Tell your family, friends, and coworkers that you are going to quit and need their support. Ask them not to smoke around you. Get individual, group, or telephone counseling and support. Programs are available at [employer] and health centers. Call your local health department for information about programs in your area. Spiritual beliefs and practices may help some smokers quit. Download a "quit meter" on your computer to keep track of quit statistics, such as how long you have gone without smoking, cigarettes not smoked, and money saved. Get a self-help book about quitting smoking and staying off tobacco. [place] yourself from urges to smoke. Talk to someone, go for a walk, or occupy your time with a task. Change your normal routine. Take a different route to work. Drink tea instead of coffee. Eat breakfast in a different place. Reduce your stress. Take a hot bath, exercise, or read a book. Plan something enjoyable to do every day. Reward yourself for not smoking. Explore interactive web-based programs that specialize in helping you quit. GET MEDICINE AND USE IT CORRECTLY Medicines can help you stop smoking and decrease the urge to smoke. Combining medicine with the above behavioral methods and support can greatly increase your chances of successfully quitting smoking. Nicotine replacement therapy helps deliver nicotine to your body without the negative effects and risks of smoking. Nicotine replacement therapy includes nicotine gum, lozenges, inhalers, nasal sprays, and skin patches. Some may be available  over-the-counter and others require a prescription. Antidepressant medicine helps people abstain from smoking, but how this works is unknown. This medicine is available by prescription. Nicotinic receptor partial agonist medicine simulates the effect of nicotine in your brain. This medicine is available by prescription. Ask your health care provider for advice about which medicines to use and how to use them based on your health history. Your health care provider will tell you what side effects to look out for if you choose to be on a medicine or therapy. Carefully read the information on the package. Do not use any other product containing nicotine while using a nicotine replacement product.  RELAPSE OR DIFFICULT SITUATIONS Most relapses occur within the first 3 months after quitting. Do not be discouraged if you start smoking again. Remember, most people try several times before finally quitting. You may have symptoms of withdrawal because your body is used to nicotine. You may crave cigarettes, be irritable, feel very hungry, cough often, get headaches, or have difficulty concentrating. The withdrawal symptoms are only temporary. They are strongest when you first quit, but they will go away within 10-14 days. To reduce the chances of relapse, try to: Avoid drinking alcohol. Drinking lowers your chances of successfully quitting.  Reduce the amount of caffeine you consume. Once you quit smoking, the amount of caffeine in your body increases and can give you symptoms, such as a rapid heartbeat, sweating, and anxiety. Avoid smokers because they can make you want to smoke. Do not let weight gain distract you. Many smokers will gain weight when they quit, usually less than 10 pounds. Eat a healthy diet and stay active. You can always lose the weight gained after you quit. Find ways to improve your mood other than smoking. FOR MORE INFORMATION  www.smokefree.gov  Document Released: 01/06/2001 Document  Revised: 05/29/2013 Document Reviewed: 04/23/2011 [facility] Patient Information 2015 [place], [place]. This information is not intended to replace advice given to you by your health care provider. Make sure you discuss any questions you have with your health care provider.   your health. Most adults should get at least 150 minutes of moderate-intensity exercise (any activity that increases your heart rate and causes you to sweat) each week. In addition, most adults need muscle-strengthening exercises on 2 or more days a week.  Maintain a healthy weight. The body mass index (BMI) is a screening tool to identify possible weight problems. It provides an estimate of body fat based on height and weight. Your health care provider can find your BMI and can help you achieve or maintain a healthy weight.For adults 20 years and older:  A BMI below 18.5 is considered underweight.  A BMI of 18.5 to 24.9 is normal.  A BMI of 25 to 29.9 is considered overweight.  A BMI of 30 and above is considered obese.  Maintain normal blood lipids and cholesterol levels by exercising and minimizing your intake of saturated fat. Eat a balanced diet with plenty of fruit and vegetables. Blood tests for lipids and cholesterol should begin at age 66 and be repeated every 5 years. If your lipid or cholesterol levels are high, you are over 50, or you are at high risk for heart disease, you may need your cholesterol levels checked more frequently.Ongoing high lipid and cholesterol levels should be treated with medicines if diet and exercise are not working.  If you smoke, find out from your health care provider how to quit. If you do not use tobacco, do not start.  Lung cancer screening is recommended for adults aged 57-80 years who are at high risk for developing lung cancer because of a history of smoking. A yearly low-dose CT scan of the lungs is recommended for people who have at least a 30-pack-year history of smoking and are  a current smoker or have quit within the past 15 years. A pack year of smoking is smoking an average of 1 pack of cigarettes a day for 1 year (for example: 1 pack a day for 30 years or 2 packs a day for 15 years). Yearly screening should continue until the smoker has stopped smoking for at least 15 years. Yearly screening should be stopped for people who develop a health problem that would prevent them from having lung cancer treatment.  If you are pregnant, do not drink alcohol. If you are breastfeeding, be very cautious about drinking alcohol. If you are not pregnant and choose to drink alcohol, do not have more than 1 drink per day. One drink is considered to be 12 ounces (355 mL) of beer, 5 ounces (148 mL) of wine, or 1.5 ounces (44 mL) of liquor.  Avoid use of street drugs. Do not share needles with anyone. Ask for help if you  need support or instructions about stopping the use of drugs.  High blood pressure causes heart disease and increases the risk of stroke. Your blood pressure should be checked at least every 1 to 2 years. Ongoing high blood pressure should be treated with medicines if weight loss and exercise do not work.  If you are 5-37 years old, ask your health care provider if you should take aspirin to prevent strokes.  Diabetes screening involves taking a blood sample to check your fasting blood sugar level. This should be done once every 3 years, after age 22, if you are within normal weight and without risk factors for diabetes. Testing should be considered at a younger age or be carried out more frequently if you are overweight and have at least 1 risk factor for diabetes.  Breast cancer screening is essential preventive care for women. You should practice "breast self-awareness." This means understanding the normal appearance and feel of your breasts and may include breast self-examination. Any changes detected, no matter how small, should be reported to a health care provider.  Women in their 34s and 30s should have a clinical breast exam (CBE) by a health care provider as part of a regular health exam every 1 to 3 years. After age 25, women should have a CBE every year. Starting at age 68, women should consider having a mammogram (breast X-ray test) every year. Women who have a family history of breast cancer should talk to their health care provider about genetic screening. Women at a high risk of breast cancer should talk to their health care providers about having an MRI and a mammogram every year.  Breast cancer gene (BRCA)-related cancer risk assessment is recommended for women who have family members with BRCA-related cancers. BRCA-related cancers include breast, ovarian, tubal, and peritoneal cancers. Having family members with these cancers may be associated with an increased risk for harmful changes (mutations) in the breast cancer genes BRCA1 and BRCA2. Results of the assessment will determine the need for genetic counseling and BRCA1 and BRCA2 testing.  Routine pelvic exams to screen for cancer are no longer recommended for nonpregnant women who are considered low risk for cancer of the pelvic organs (ovaries, uterus, and vagina) and who do not have symptoms. Ask your health care provider if a screening pelvic exam is right for you.  If you have had past treatment for cervical cancer or a condition that could lead to cancer, you need Pap tests and screening for cancer for at least 20 years after your treatment. If Pap tests have been discontinued, your risk factors (such as having a new sexual partner) need to be reassessed to determine if screening should be resumed. Some women have medical problems that increase the chance of getting cervical cancer. In these cases, your health care provider may recommend more frequent screening and Pap tests.  The HPV test is an additional test that may be used for cervical cancer screening. The HPV test looks for the virus that can  cause the cell changes on the cervix. The cells collected during the Pap test can be tested for HPV. The HPV test could be used to screen women aged 74 years and older, and should be used in women of any age who have unclear Pap test results. After the age of 50, women should have HPV testing at the same frequency as a Pap test.  Colorectal cancer can be detected and often prevented. Most routine colorectal cancer screening begins at the  age of 11 years and continues through age 65 years. However, your health care provider may recommend screening at an earlier age if you have risk factors for colon cancer. On a yearly basis, your health care provider may provide home test kits to check for hidden blood in the stool. Use of a small camera at the end of a tube, to directly examine the colon (sigmoidoscopy or colonoscopy), can detect the earliest forms of colorectal cancer. Talk to your health care provider about this at age 52, when routine screening begins. Direct exam of the colon should be repeated every 5-10 years through age 45 years, unless early forms of pre-cancerous polyps or small growths are found.  People who are at an increased risk for hepatitis B should be screened for this virus. You are considered at high risk for hepatitis B if:  You were born in a country where hepatitis B occurs often. Talk with your health care provider about which countries are considered high risk.  Your parents were born in a high-risk country and you have not received a shot to protect against hepatitis B (hepatitis B vaccine).  You have HIV or AIDS.  You use needles to inject street drugs.  You live with, or have sex with, someone who has hepatitis B.  You get hemodialysis treatment.  You take certain medicines for conditions like cancer, organ transplantation, and autoimmune conditions.  Hepatitis C blood testing is recommended for all people born from [birth year] through 1965 and any individual with known  risks for hepatitis C.  Practice safe sex. Use condoms and avoid high-risk sexual practices to reduce the spread of sexually transmitted infections (STIs). STIs include gonorrhea, chlamydia, syphilis, trichomonas, herpes, HPV, and human immunodeficiency virus (HIV). Herpes, HIV, and HPV are viral illnesses that have no cure. They can result in disability, cancer, and death.  You should be screened for sexually transmitted illnesses (STIs) including gonorrhea and chlamydia if:  You are sexually active and are younger than 24 years.  You are older than 24 years and your health care provider tells you that you are at risk for this type of infection.  Your sexual activity has changed since you were last screened and you are at an increased risk for chlamydia or gonorrhea. Ask your health care provider if you are at risk.  If you are at risk of being infected with HIV, it is recommended that you take a prescription medicine daily to prevent HIV infection. This is called preexposure prophylaxis (PrEP). You are considered at risk if:  You are a heterosexual woman, are sexually active, and are at increased risk for HIV infection.  You take drugs by injection.  You are sexually active with a partner who has HIV.  Talk with your health care provider about whether you are at high risk of being infected with HIV. If you choose to begin PrEP, you should first be tested for HIV. You should then be tested every 3 months for as long as you are taking PrEP.  Osteoporosis is a disease in which the bones lose minerals and strength with aging. This can result in serious bone fractures or breaks. The risk of osteoporosis can be identified using a bone density scan. Women ages 83 years and over and women at risk for fractures or osteoporosis should discuss screening with their health care providers. Ask your health care provider whether you should take a calcium supplement or vitamin D to reduce the rate of  osteoporosis.  Menopause can be associated with physical symptoms and risks. Hormone replacement therapy is available to decrease symptoms and risks. You should talk to your health care provider about whether hormone replacement therapy is right for you.  Use sunscreen. Apply sunscreen liberally and repeatedly throughout the day. You should seek shade when your shadow is shorter than you. Protect yourself by wearing long sleeves, pants, a wide-brimmed hat, and sunglasses year round, whenever you are outdoors.  Once a month, do a whole body skin exam, using a mirror to look at the skin on your back. Tell your health care provider of new moles, moles that have irregular borders, moles that are larger than a pencil eraser, or moles that have changed in shape or color.  Stay current with required vaccines (immunizations).  Influenza vaccine. All adults should be immunized every year.  Tetanus, diphtheria, and acellular pertussis (Td, Tdap) vaccine. Pregnant women should receive 1 dose of Tdap vaccine during each pregnancy. The dose should be obtained regardless of the length of time since the last dose. Immunization is preferred during the 27th-36th week of gestation. An adult who has not previously received Tdap or who does not know her vaccine status should receive 1 dose of Tdap. This initial dose should be followed by tetanus and diphtheria toxoids (Td) booster doses every 10 years. Adults with an unknown or incomplete history of completing a 3-dose immunization series with Td-containing vaccines should begin or complete a primary immunization series including a Tdap dose. Adults should receive a Td booster every 10 years.  Varicella vaccine. An adult without evidence of immunity to varicella should receive 2 doses or a second dose if she has previously received 1 dose. Pregnant females who do not have evidence of immunity should receive the first dose after pregnancy. This first dose should be  obtained before leaving the health care facility. The second dose should be obtained 4-8 weeks after the first dose.  Human papillomavirus (HPV) vaccine. Females aged 13-26 years who have not received the vaccine previously should obtain the 3-dose series. The vaccine is not recommended for use in pregnant females. However, pregnancy testing is not needed before receiving a dose. If a female is found to be pregnant after receiving a dose, no treatment is needed. In that case, the remaining doses should be delayed until after the pregnancy. Immunization is recommended for any person with an immunocompromised condition through the age of 53 years if she did not get any or all doses earlier. During the 3-dose series, the second dose should be obtained 4-8 weeks after the first dose. The third dose should be obtained 24 weeks after the first dose and 16 weeks after the second dose.  Zoster vaccine. One dose is recommended for adults aged 59 years or older unless certain conditions are present.  Measles, mumps, and rubella (MMR) vaccine. Adults born before 60 generally are considered immune to measles and mumps. Adults born in [birth year] or later should have 1 or more doses of MMR vaccine unless there is a contraindication to the vaccine or there is laboratory evidence of immunity to each of the three diseases. A routine second dose of MMR vaccine should be obtained at least 28 days after the first dose for students attending postsecondary schools, health care workers, or international travelers. People who received inactivated measles vaccine or an unknown type of measles vaccine during 1963-1967 should receive 2 doses of MMR vaccine. People who received inactivated mumps vaccine or an unknown type of mumps  vaccine before 1979 and are at high risk for mumps infection should consider immunization with 2 doses of MMR vaccine. For females of childbearing age, rubella immunity should be determined. If there is no evidence  of immunity, females who are not pregnant should be vaccinated. If there is no evidence of immunity, females who are pregnant should delay immunization until after pregnancy. Unvaccinated health care workers born before 65 who lack laboratory evidence of measles, mumps, or rubella immunity or laboratory confirmation of disease should consider measles and mumps immunization with 2 doses of MMR vaccine or rubella immunization with 1 dose of MMR vaccine.  Pneumococcal 13-valent conjugate (PCV13) vaccine. When indicated, a person who is uncertain of her immunization history and has no record of immunization should receive the PCV13 vaccine. An adult aged 83 years or older who has certain medical conditions and has not been previously immunized should receive 1 dose of PCV13 vaccine. This PCV13 should be followed with a dose of pneumococcal polysaccharide (PPSV23) vaccine. The PPSV23 vaccine dose should be obtained at least 8 weeks after the dose of PCV13 vaccine. An adult aged 35 years or older who has certain medical conditions and previously received 1 or more doses of PPSV23 vaccine should receive 1 dose of PCV13. The PCV13 vaccine dose should be obtained 1 or more years after the last PPSV23 vaccine dose.  Pneumococcal polysaccharide (PPSV23) vaccine. When PCV13 is also indicated, PCV13 should be obtained first. All adults aged 42 years and older should be immunized. An adult younger than age 59 years who has certain medical conditions should be immunized. Any person who resides in a nursing home or long-term care facility should be immunized. An adult smoker should be immunized. People with an immunocompromised condition and certain other conditions should receive both PCV13 and PPSV23 vaccines. People with human immunodeficiency virus (HIV) infection should be immunized as soon as possible after diagnosis. Immunization during chemotherapy or radiation therapy should be avoided. Routine use of PPSV23 vaccine  is not recommended for American Indians, [place] Natives, or people younger than 65 years unless there are medical conditions that require PPSV23 vaccine. When indicated, people who have unknown immunization and have no record of immunization should receive PPSV23 vaccine. One-time revaccination 5 years after the first dose of PPSV23 is recommended for people aged 19-64 years who have chronic kidney failure, nephrotic syndrome, asplenia, or immunocompromised conditions. People who received 1-2 doses of PPSV23 before age 1 years should receive another dose of PPSV23 vaccine at age 12 years or later if at least 5 years have passed since the previous dose. Doses of PPSV23 are not needed for people immunized with PPSV23 at or after age 59 years.  Meningococcal vaccine. Adults with asplenia or persistent complement component deficiencies should receive 2 doses of quadrivalent meningococcal conjugate (MenACWY-D) vaccine. The doses should be obtained at least 2 months apart. Microbiologists working with certain meningococcal bacteria, [employer] recruits, people at risk during an outbreak, and people who travel to or live in countries with a high rate of meningitis should be immunized. A first-year college student up through age 74 years who is living in a residence hall should receive a dose if she did not receive a dose on or after her 16th birthday. Adults who have certain high-risk conditions should receive one or more doses of vaccine.  Hepatitis A vaccine. Adults who wish to be protected from this disease, have certain high-risk conditions, work with hepatitis A-infected animals, work in hepatitis A research labs,  or travel to or work in countries with a high rate of hepatitis A should be immunized. Adults who were previously unvaccinated and who anticipate close contact with an international adoptee during the first 60 days after arrival in the [place] from a country with a high rate of hepatitis A should be  immunized.  Hepatitis B vaccine. Adults who wish to be protected from this disease, have certain high-risk conditions, may be exposed to blood or other infectious body fluids, are household contacts or sex partners of hepatitis B positive people, are clients or workers in certain care facilities, or travel to or work in countries with a high rate of hepatitis B should be immunized.  Haemophilus influenzae type b (Hib) vaccine. A previously unvaccinated person with asplenia or sickle cell disease or having a scheduled splenectomy should receive 1 dose of Hib vaccine. Regardless of previous immunization, a recipient of a hematopoietic stem cell transplant should receive a 3-dose series 6-12 months after her successful transplant. Hib vaccine is not recommended for adults with HIV infection. Preventive Services / Frequency Ages 75 to 9 years  Blood pressure check.** / Every 1 to 2 years.  Lipid and cholesterol check.** / Every 5 years beginning at age 4.  Clinical breast exam.** / Every 3 years for women in their 22s and 47s.  BRCA-related cancer risk assessment.** / For women who have family members with a BRCA-related cancer (breast, ovarian, tubal, or peritoneal cancers).  Pap test.** / Every 2 years from ages 31 through 21. Every 3 years starting at age 71 through age 96 or 38 with a history of 3 consecutive normal Pap tests.  HPV screening.** / Every 3 years from ages 26 through ages 84 to 67 with a history of 3 consecutive normal Pap tests.  Hepatitis C blood test.** / For any individual with known risks for hepatitis C.  Skin self-exam. / Monthly.  Influenza vaccine. / Every year.  Tetanus, diphtheria, and acellular pertussis (Tdap, Td) vaccine.** / Consult your health care provider. Pregnant women should receive 1 dose of Tdap vaccine during each pregnancy. 1 dose of Td every 10 years.  Varicella vaccine.** / Consult your health care provider. Pregnant females who do not have  evidence of immunity should receive the first dose after pregnancy.  HPV vaccine. / 3 doses over 6 months, if 58 and younger. The vaccine is not recommended for use in pregnant females. However, pregnancy testing is not needed before receiving a dose.  Measles, mumps, rubella (MMR) vaccine.** / You need at least 1 dose of MMR if you were born in 1957 or later. You may also need a 2nd dose. For females of childbearing age, rubella immunity should be determined. If there is no evidence of immunity, females who are not pregnant should be vaccinated. If there is no evidence of immunity, females who are pregnant should delay immunization until after pregnancy.  Pneumococcal 13-valent conjugate (PCV13) vaccine.** / Consult your health care provider.  Pneumococcal polysaccharide (PPSV23) vaccine.** / 1 to 2 doses if you smoke cigarettes or if you have certain conditions.  Meningococcal vaccine.** / 1 dose if you are age 59 to 56 years and a Market researcher living in a residence hall, or have one of several medical conditions, you need to get vaccinated against meningococcal disease. You may also need additional booster doses.  Hepatitis A vaccine.** / Consult your health care provider.  Hepatitis B vaccine.** / Consult your health care provider.  Haemophilus influenzae type  b (Hib) vaccine.** / Consult your health care provider. Ages 64 to 39 years  Blood pressure check.** / Every 1 to 2 years.  Lipid and cholesterol check.** / Every 5 years beginning at age 51 years.  Lung cancer screening. / Every year if you are aged 81-80 years and have a 30-pack-year history of smoking and currently smoke or have quit within the past 15 years. Yearly screening is stopped once you have quit smoking for at least 15 years or develop a health problem that would prevent you from having lung cancer treatment.  Clinical breast exam.** / Every year after age 61 years.  BRCA-related cancer risk  assessment.** / For women who have family members with a BRCA-related cancer (breast, ovarian, tubal, or peritoneal cancers).  Mammogram.** / Every year beginning at age 83 years and continuing for as long as you are in good health. Consult with your health care provider.  Pap test.** / Every 3 years starting at age 45 years through age 67 or 39 years with a history of 3 consecutive normal Pap tests.  HPV screening.** / Every 3 years from ages 26 years through ages 45 to 80 years with a history of 3 consecutive normal Pap tests.  Fecal occult blood test (FOBT) of stool. / Every year beginning at age 38 years and continuing until age 35 years. You may not need to do this test if you get a colonoscopy every 10 years.  Flexible sigmoidoscopy or colonoscopy.** / Every 5 years for a flexible sigmoidoscopy or every 10 years for a colonoscopy beginning at age 20 years and continuing until age 19 years.  Hepatitis C blood test.** / For all people born from [birth year] through 1966 and any individual with known risks for hepatitis C.  Skin self-exam. / Monthly.  Influenza vaccine. / Every year.  Tetanus, diphtheria, and acellular pertussis (Tdap/Td) vaccine.** / Consult your health care provider. Pregnant women should receive 1 dose of Tdap vaccine during each pregnancy. 1 dose of Td every 10 years.  Varicella vaccine.** / Consult your health care provider. Pregnant females who do not have evidence of immunity should receive the first dose after pregnancy.  Zoster vaccine.** / 1 dose for adults aged 74 years or older.  Measles, mumps, rubella (MMR) vaccine.** / You need at least 1 dose of MMR if you were born in 1957 or later. You may also need a 2nd dose. For females of childbearing age, rubella immunity should be determined. If there is no evidence of immunity, females who are not pregnant should be vaccinated. If there is no evidence of immunity, females who are pregnant should delay immunization until  after pregnancy.  Pneumococcal 13-valent conjugate (PCV13) vaccine.** / Consult your health care provider.  Pneumococcal polysaccharide (PPSV23) vaccine.** / 1 to 2 doses if you smoke cigarettes or if you have certain conditions.  Meningococcal vaccine.** / Consult your health care provider.  Hepatitis A vaccine.** / Consult your health care provider.  Hepatitis B vaccine.** / Consult your health care provider.  Haemophilus influenzae type b (Hib) vaccine.** / Consult your health care provider. Ages 1 years and over  Blood pressure check.** / Every 1 to 2 years.  Lipid and cholesterol check.** / Every 5 years beginning at age 40 years.  Lung cancer screening. / Every year if you are aged 25-80 years and have a 30-pack-year history of smoking and currently smoke or have quit within the past 15 years. Yearly screening is stopped once you have quit  smoking for at least 15 years or develop a health problem that would prevent you from having lung cancer treatment.  Clinical breast exam.** / Every year after age 75 years.  BRCA-related cancer risk assessment.** / For women who have family members with a BRCA-related cancer (breast, ovarian, tubal, or peritoneal cancers).  Mammogram.** / Every year beginning at age 44 years and continuing for as long as you are in good health. Consult with your health care provider.  Pap test.** / Every 3 years starting at age 109 years through age 12 or 25 years with 3 consecutive normal Pap tests. Testing can be stopped between 65 and 70 years with 3 consecutive normal Pap tests and no abnormal Pap or HPV tests in the past 10 years.  HPV screening.** / Every 3 years from ages 56 years through ages 65 or 59 years with a history of 3 consecutive normal Pap tests. Testing can be stopped between 65 and 70 years with 3 consecutive normal Pap tests and no abnormal Pap or HPV tests in the past 10 years.  Fecal occult blood test (FOBT) of stool. / Every year  beginning at age 22 years and continuing until age 78 years. You may not need to do this test if you get a colonoscopy every 10 years.  Flexible sigmoidoscopy or colonoscopy.** / Every 5 years for a flexible sigmoidoscopy or every 10 years for a colonoscopy beginning at age 75 years and continuing until age 59 years.  Hepatitis C blood test.** / For all people born from [birth year] through 1966 and any individual with known risks for hepatitis C.  Osteoporosis screening.** / A one-time screening for women ages 65 years and over and women at risk for fractures or osteoporosis.  Skin self-exam. / Monthly.  Influenza vaccine. / Every year.  Tetanus, diphtheria, and acellular pertussis (Tdap/Td) vaccine.** / 1 dose of Td every 10 years.  Varicella vaccine.** / Consult your health care provider.  Zoster vaccine.** / 1 dose for adults aged 69 years or older.  Pneumococcal 13-valent conjugate (PCV13) vaccine.** / Consult your health care provider.  Pneumococcal polysaccharide (PPSV23) vaccine.** / 1 dose for all adults aged 60 years and older.  Meningococcal vaccine.** / Consult your health care provider.  Hepatitis A vaccine.** / Consult your health care provider.  Hepatitis B vaccine.** / Consult your health care provider.  Haemophilus influenzae type b (Hib) vaccine.** / Consult your health care provider. ** Family history and personal history of risk and conditions may change your health care provider's recommendations. Document Released: 03/10/2001 Document Revised: 05/29/2013 Document Reviewed: 06/09/2010 [facility] Patient Information 2015 [place], [place]. This information is not intended to replace advice given to you by your health care provider. Make sure you discuss any questions you have with your health care provider.

## 2014-03-03 LAB — RPR

## 2014-03-03 LAB — TSH: TSH: 0.957 u[IU]/mL (ref 0.350–4.500)

## 2014-03-03 LAB — HEPATITIS C ANTIBODY: HCV AB: NEGATIVE

## 2014-03-03 LAB — HEPATITIS B SURFACE ANTIGEN: HEP B S AG: NEGATIVE

## 2014-03-03 LAB — HIV ANTIBODY (ROUTINE TESTING W REFLEX): HIV 1&2 Ab, 4th Generation: NONREACTIVE

## 2014-03-05 LAB — GC/CHLAMYDIA PROBE AMP
CT PROBE, AMP APTIMA: NEGATIVE
GC Probe RNA: NEGATIVE

## 2014-03-06 ENCOUNTER — Ambulatory Visit (HOSPITAL_COMMUNITY): Payer: Self-pay | Admitting: Psychiatry

## 2014-03-06 ENCOUNTER — Encounter (HOSPITAL_COMMUNITY): Payer: Self-pay | Admitting: Psychiatry

## 2014-03-06 ENCOUNTER — Ambulatory Visit (INDEPENDENT_AMBULATORY_CARE_PROVIDER_SITE_OTHER): Payer: 59 | Admitting: Psychiatry

## 2014-03-06 VITALS — BP 120/83 | HR 79 | Ht 64.5 in | Wt 163.4 lb

## 2014-03-06 DIAGNOSIS — G4700 Insomnia, unspecified: Secondary | ICD-10-CM

## 2014-03-06 DIAGNOSIS — F319 Bipolar disorder, unspecified: Secondary | ICD-10-CM

## 2014-03-06 MED ORDER — HYDROXYZINE HCL 50 MG PO TABS: ORAL_TABLET | ORAL | Status: DC

## 2014-03-06 NOTE — Progress Notes (Signed)
[facility] [phone] Progress Note  Diana Francis [id number] 34 y.o.  03/06/2014 2:48 PM  Chief Complaint:  I am not taking Lamictal and I am doing fine.  I do take Vistaril which is helping my sleep.      History of Present Illness. [name] came for her followup appointment.  She is no longer taking Lamictal.  She denies any irritability, anger or any mood swing.  However she does take Vistaril at night which is helping her sleep and sometime anxiety.  Her job is going very well.  She's promoted and recently got raised .  She did not notice any changes in her behavior since she stopped taking Lamictal.  Her appetite is okay.  Her vitals are stable.  She recently has physical and blood work which is normal.  Patient denies any paranoia, hallucination or any crying spells.  Patient denies drinking or using any illegal substances.  Suicidal Ideation: No Plan Formed: No Patient has means to carry out plan: No  Homicidal Ideation: No Plan Formed: No Patient has means to carry out plan: No  Review of Systems  Constitutional: Negative for malaise/fatigue.  Skin: Negative for itching and rash.  Psychiatric/Behavioral: Negative for suicidal ideas and substance abuse. The patient is nervous/anxious and has insomnia.     Psychiatric: Agitation: No Hallucination: No Depressed Mood: No Insomnia: No Hypersomnia: No Altered Concentration: No Feels Worthless: No Grandiose Ideas: No Belief In Special Powers: No New/Increased Substance Abuse: No Compulsions: No  Neurologic: Headache: Yes Seizure: No Paresthesias: No  Medical History;  Patient has hypertension, GERD and hypothyroidism.  She has hysterectomy .  Her primary care physician is Dr. Altha Harm at [facility].  She denies any history of traumatic brain injury, seizures or any concussion.   Outpatient Encounter Prescriptions as of 03/06/2014  Medication Sig  . hydrOXYzine (ATARAX/VISTARIL) 50 MG tablet Take 1  tab at bed time  . QSYMIA 7.5-46 MG CP24 Take 1 capsule by mouth every morning.  . Vitamin D, Ergocalciferol, (DRISDOL) 50000 UNITS CAPS capsule   . [DISCONTINUED] hydrOXYzine (ATARAX/VISTARIL) 50 MG tablet Take 1 tab at bed time    Past Psychiatric History/Hospitalization(s) Patient has done twice intensive outpatient program .  Patient denies any history of psychiatric inpatient treatment or any suicidal attempt however endorse history of mood swings irritability and anger.  She denies any psychosis or any hallucination.  She was referred to [place] from primary care physician who started her on Prozac but she stopped because of significant nausea and vomiting.  In the past she had tried Zoloft, Remeron with limited response.   Anxiety: Yes Bipolar Disorder: No Depression: Yes Mania: No Psychosis: No Schizophrenia: No Personality Disorder: No Hospitalization for psychiatric illness: No History of Electroconvulsive Shock Therapy: No Prior Suicide Attempts: No  Physical Exam: Constitutional:  BP 120/83 mmHg  Pulse 79  Ht 5' 4.5" (1.638 m)  Wt 163 lb 6.4 oz (74.118 kg)  BMI 27.62 kg/m2  LMP 12/19/2012  Recent Results (from the past 2160 hour(s))  GC/Chlamydia Probe Amp     Status: None   Collection Time: 03/02/14  9:44 AM  Result Value Ref Range   CT Probe RNA NEGATIVE    GC Probe RNA NEGATIVE     Comment:                                                                                         **  Normal Reference Range: Negative**         Assay performed using the Gen-Probe APTIMA COMBO2 (R) Assay.   Acceptable specimen types for this assay include APTIMA Swabs (Unisex, endocervical, urethral, or vaginal), first void urine, and ThinPrep liquid based cytology samples.   TSH     Status: None   Collection Time: 03/02/14  9:44 AM  Result Value Ref Range   TSH 0.957 0.350 - 4.500 uIU/mL  HIV antibody     Status: None   Collection Time: 03/02/14  9:44 AM  Result Value Ref Range    HIV 1&2 Ab, 4th Generation NONREACTIVE NONREACTIVE    Comment:   A NONREACTIVE HIV Ag/Ab result does not exclude HIV infection since the time frame for seroconversion is variable. If acute HIV infection is suspected, a HIV-1 RNA Qualitative TMA test is recommended.   HIV-1/2 Antibody Diff         Not indicated. HIV-1 RNA, Qual TMA           Not indicated.   PLEASE NOTE: This information has been disclosed to you from records whose confidentiality may be protected by state law. If your state requires such protection, then the state law prohibits you from making any further disclosure of the information without the specific written consent of the person to whom it pertains, or as otherwise permitted by law. A general authorization for the release of medical or other information is NOT sufficient for this purpose.   The performance of this assay has not been clinically validated in patients less than 50 years old.   RPR     Status: None   Collection Time: 03/02/14  9:44 AM  Result Value Ref Range   RPR Ser Ql NON REAC NON REAC  Hepatitis B surface antigen     Status: None   Collection Time: 03/02/14  9:44 AM  Result Value Ref Range   Hepatitis B Surface Ag NEGATIVE NEGATIVE  Hepatitis C antibody     Status: None   Collection Time: 03/02/14  9:44 AM  Result Value Ref Range   HCV Ab NEGATIVE NEGATIVE   Musculoskeletal: Strength & Muscle Tone: within normal limits Gait & Station: normal Patient leans: N/A  Mental Status Examination;  Patient is casually dressed and groomed.  Her speech is clear and coherent.  She describes her mood as good and her affect is mood appropriate.  Her thought process is slow but logical and goal directed.  She denies any active or passive suicidal thoughts or homicidal thoughts.  She denies any auditory or visual hallucination.  Her fund of knowledge is average.  There is no paranoia or delusions present at this time.  She's alert and oriented x3.  Her  insight judgment and impulse control is okay.  Established Problem, Stable/Improving (1), Review of Psycho-Social Stressors (1), Review or order clinical lab tests (1), Review of Last Therapy Session (1), Review of Medication Regimen & Side Effects (2) and Review of New Medication or Change in Dosage (2)  Assessment: Axis I: Bipolar Disorder, insomnia Axis II: Deferred  Axis III:  Past Medical History  Diagnosis Date  . Thyroid disease   . Hypothyroidism   . GERD (gastroesophageal reflux disease)   . Headache(784.0)   . HTN (hypertension)     no meds- per pt white coat syndrome  . Anemia     history of anemia  . Depression     Bipolar    Plan:  Patient is no longer  taking Lamictal.  However she is taking Vistaril at bedtime which is helping her sleep and sometime anxiety.  I review her blood work results and collateral information from her primary care physician.  We discuss if signs and symptoms come back then she may need to restart Lamictal.  Patient knowledge and agree with the plan.  She is not interested to see counselor at this time because she feel there is no need for that.  I will see her again in 3 months.  A new prescription of Vistaril 50 mg is given.Time spent 25 minutes.  More than 50% of the time spent in psychoeducation, counseling and coordination of care.  Discuss safety plan that anytime having active suicidal thoughts or homicidal thoughts then patient need to call 911 or go to the local emergency room.   Torin Whisner T., MD 03/06/2014

## 2014-04-13 ENCOUNTER — Ambulatory Visit (INDEPENDENT_AMBULATORY_CARE_PROVIDER_SITE_OTHER): Payer: 59 | Admitting: Psychiatry

## 2014-04-13 DIAGNOSIS — F319 Bipolar disorder, unspecified: Secondary | ICD-10-CM

## 2014-04-16 NOTE — Progress Notes (Signed)
   THERAPIST PROGRESS NOTE   Session Time: 3:20-4:00  Participation Level: Active  Behavioral Response: CasualAlertEuthymic   Type of Therapy: Individual Therapy   Treatment Goals addressed: emotion regulation, stress management, coping skills   Interventions: CBT, supportive, strength-based   Summary: Diana Francis is a 34 y.o. female who presents with depression and anxiety.   Suicidal/Homicidal: nowithout intent/plan   Therapist Response: Pt. Continues to present with positive mood, bright affect, calm, talkative, good eye contact. Pt. Reports continued improvement in her relationship with her mother who is now supportive of her and speaks to her often for emotional support. Pt. Reports that she received job promotion and pay raise. Pt. 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 Significant time in session was spent processing current communication patterns and how she can communicate effectively perceived unfairness to her supervisor. Pt. Also discussed ongoing problems with her eldest son who has been diagnosed with ADHD and ODD. Pt. Believes that his ODD behavior was triggered by meeting birth mother, and as a result her son feels entitled to be disrespectful. Pt. Discussed communication patterns, communicating empathy for his confusion and pain, and communication her commitment to providing stable presence in his life.   Plan: Continue with CBT and strength based approach. Return again in 4 weeks.   Diagnosis: Axis I: Depressive Disorder NOS  Axis II: deferred    Nancie Neas, [facility] 04/16/2014

## 2014-06-04 ENCOUNTER — Encounter (HOSPITAL_COMMUNITY): Payer: Self-pay | Admitting: Psychiatry

## 2014-06-04 ENCOUNTER — Ambulatory Visit (INDEPENDENT_AMBULATORY_CARE_PROVIDER_SITE_OTHER): Payer: 59 | Admitting: Psychiatry

## 2014-06-04 VITALS — BP 134/102 | HR 92 | Ht 65.0 in | Wt 171.0 lb

## 2014-06-04 DIAGNOSIS — F419 Anxiety disorder, unspecified: Secondary | ICD-10-CM

## 2014-06-04 DIAGNOSIS — G4700 Insomnia, unspecified: Secondary | ICD-10-CM | POA: Diagnosis not present

## 2014-06-04 DIAGNOSIS — F3130 Bipolar disorder, current episode depressed, mild or moderate severity, unspecified: Secondary | ICD-10-CM | POA: Diagnosis not present

## 2014-06-04 NOTE — Progress Notes (Addendum)
Diana Francis [id number] Progress Note  Diana Francis [id number] 34 y.o.  06/04/2014 3:58 PM  Chief Complaint:  I'm taking Vistaril only as needed for insomnia.  I'm doing better and I do not feel any to go back on medication.      History of Present Illness. Diana Francis came for her followup appointment.  She endorse her mood has been good and she denies any irritability, anger, severe mood swing or any crying spells.  Her anxiety is also better.  She is keeping herself busy by raising kids and working.  She is working more than 50 hours a week.  She admitted some time insomnia and racing thoughts and takes Vistaril but she is still has refill remaining.  She is seeing Diana Francis every 3 months.  She does not want anything mood stabilizer or Lamictal because she believed things are going very well.  However she promises that she will call us if symptoms started to get back.  She liked to keep the appointment every 4-5 months for checkup.  Patient denies drinking or using any illegal substances.  Her appetite is okay but she has gained weight from the past.  Her but patient is also slightly increase but she has no other associated symptoms.  She admitted some time not keeping her appetite under control.  She denies any paranoia, hallucination or any aggressive behavior.  Patient denies drinking or using any illegal substances.  Suicidal Ideation: No Plan Formed: No Patient has means to carry out plan: No  Homicidal Ideation: No Plan Formed: No Patient has means to carry out plan: No  Review of Systems  Constitutional: Negative for weight loss.  Cardiovascular: Negative for chest pain and palpitations.  Skin: Negative.   Neurological: Negative for tremors and headaches.  Psychiatric/Behavioral: Negative for depression, suicidal ideas, hallucinations, memory loss and substance abuse. The patient has insomnia. The patient is not nervous/anxious.     Psychiatric: Agitation:  No Hallucination: No Depressed Mood: No Insomnia: Yes Hypersomnia: No Altered Concentration: No Feels Worthless: No Grandiose Ideas: No Belief In Special Powers: No New/Increased Substance Abuse: No Compulsions: No  Neurologic: Headache: Yes Seizure: No Paresthesias: No  Medical History;  Patient has hypertension, GERD and hypothyroidism.  She has hysterectomy .  Her primary care physician is Dr. Altha Francis at [facility].  She denies any history of traumatic brain injury, seizures or any concussion.   Outpatient Encounter Prescriptions as of 06/04/2014  Medication Sig  . hydrOXYzine (ATARAX/VISTARIL) 50 MG tablet Take 1 tab at bed time  . QSYMIA 7.5-46 MG CP24 Take 1 capsule by mouth every morning.  . Vitamin D, Ergocalciferol, (DRISDOL) 50000 UNITS CAPS capsule    No facility-administered encounter medications on file as of 06/04/2014.    Past Psychiatric History/Hospitalization(s) Patient has done twice intensive outpatient program .  Patient denies any history of psychiatric inpatient treatment or any suicidal attempt however endorse history of mood swings irritability and anger.  She denies any psychosis or any hallucination.  She was referred to [place] from primary care physician who started her on Prozac but she stopped because of significant nausea and vomiting.  In the past she had tried Zoloft, Remeron with limited response.   Anxiety: Yes Bipolar Disorder: No Depression: Yes Mania: No Psychosis: No Schizophrenia: No Personality Disorder: No Hospitalization for psychiatric illness: No History of Electroconvulsive Shock Therapy: No Prior Suicide Attempts: No  Physical Exam: Constitutional:  BP 138/102 mmHg  Pulse 98  Ht 5\' 5"  (1.651  m)  Wt 171 lb (77.565 kg)  BMI 28.46 kg/m2  LMP 12/19/2012  No results found for this or any previous visit (from the past 2160 hour(s)). Musculoskeletal: Strength & Muscle Tone: within normal limits Gait & Station:  normal Patient leans: N/A  Mental Status Examination;  Patient is casually dressed and groomed.  Her speech is clear and coherent.  She describes her mood euthymic and her affect is appropriate.  Her thought process is slow but logical and goal directed.  She denies any active or passive suicidal thoughts or homicidal thoughts.  She denies any auditory or visual hallucination.  Her fund of knowledge is average.  There is no paranoia or delusions present at this time.  She's alert and oriented x3.  Her insight judgment and impulse control is okay.  Established Problem, Stable/Improving (1), Review of Last Therapy Session (1) and Review of Medication Regimen & Side Effects (2)  Assessment: Axis I: Bipolar Disorder depressed type, insomnia, anxiety disorder Axis II: Deferred  Axis III:  Past Medical History  Diagnosis Date  . Thyroid disease   . Hypothyroidism   . GERD (gastroesophageal reflux disease)   . Headache(784.0)   . HTN (hypertension)     no meds- per pt white coat syndrome  . Anemia     history of anemia  . Depression     Bipolar    Plan:  Patient is doing better without any Lamictal.  She is taking Vistaril only as needed for insomnia.  Her anxiety is also under control.  Encouraged to keep appointment with Diana Francis for regular counseling .  She liked to keep appointment with psychiatrist for checkup however she promises that she will call us back or make an early appointment if she needed help.  At this time she does not need any Vistaril since she still has refill remaining.  I recommended to check her blood pressure at home and if she still has high reading that she should contact her primary care physician for further workup.  I will see her again in 4 months.  Recommended to call us back if she has any question, concern if she feels worsening of the symptoms.  ARFEEN,SYED T., MD 06/04/2014

## 2014-08-16 ENCOUNTER — Ambulatory Visit (HOSPITAL_COMMUNITY): Payer: Self-pay | Admitting: Psychiatry

## 2014-08-31 ENCOUNTER — Ambulatory Visit (HOSPITAL_COMMUNITY): Payer: Self-pay | Admitting: Psychiatry

## 2014-10-04 ENCOUNTER — Ambulatory Visit (INDEPENDENT_AMBULATORY_CARE_PROVIDER_SITE_OTHER): Payer: 59 | Admitting: Psychiatry

## 2014-10-04 ENCOUNTER — Encounter (HOSPITAL_COMMUNITY): Payer: Self-pay | Admitting: Psychiatry

## 2014-10-04 VITALS — BP 134/84 | HR 98 | Ht 65.0 in | Wt 180.6 lb

## 2014-10-04 DIAGNOSIS — F319 Bipolar disorder, unspecified: Secondary | ICD-10-CM

## 2014-10-04 DIAGNOSIS — F419 Anxiety disorder, unspecified: Secondary | ICD-10-CM

## 2014-10-04 DIAGNOSIS — F411 Generalized anxiety disorder: Secondary | ICD-10-CM

## 2014-10-04 DIAGNOSIS — G4700 Insomnia, unspecified: Secondary | ICD-10-CM

## 2014-10-04 MED ORDER — HYDROXYZINE HCL 50 MG PO TABS: ORAL_TABLET | ORAL | Status: DC

## 2014-10-04 NOTE — Progress Notes (Signed)
[place] Progress Note  Diana Francis [id number] 34 y.o.  10/04/2014 4:44 PM  Chief Complaint:  I'm feeling good.  I have not taken Vistaril in a while.        History of Present Illness. Diana Francis came for her followup appointment.  She is doing good without any medication.  She is sleeping better.  He denies any irritability, anger, mood swing.  She likes her job.  She has Vistaril however she has not taken this medication more than 2 months ago.  She admitted some time she feels she isn't nervous that she is using her coping skills.  She denies any paranoia or any hallucination.  Her sleep is good.  She has not seen therapist in a while and she does not feel she needed.  However she like to see this Probation officer every 4-5 months just in case if she needed the medication.  Patient denies drinking or using any illegal substances.  Her appetite is okay.  Her vitals are stable.  Suicidal Ideation: No Plan Formed: No Patient has means to carry out plan: No  Homicidal Ideation: No Plan Formed: No Patient has means to carry out plan: No  Review of Systems  Constitutional: Negative for weight loss.  Cardiovascular: Negative for chest pain and palpitations.  Skin: Negative.   Neurological: Negative for tremors and headaches.  Psychiatric/Behavioral: Negative for depression, suicidal ideas, hallucinations, memory loss and substance abuse. The patient is not nervous/anxious.     Psychiatric: Agitation: No Hallucination: No Depressed Mood: No Insomnia: No Hypersomnia: No Altered Concentration: No Feels Worthless: No Grandiose Ideas: No Belief In Special Powers: No New/Increased Substance Abuse: No Compulsions: No  Neurologic: Headache: Yes Seizure: No Paresthesias: No  Medical History;  Patient has hypertension, GERD and hypothyroidism.  She has hysterectomy .  Her primary care physician is Dr. Altha Harm at [facility].  She denies any history of traumatic  brain injury, seizures or any concussion.   Outpatient Encounter Prescriptions as of 10/04/2014  Medication Sig  . hydrOXYzine (ATARAX/VISTARIL) 50 MG tablet Take 1 tab at bed time  . QSYMIA 7.5-46 MG CP24 Take 1 capsule by mouth every morning.  . Vitamin D, Ergocalciferol, (DRISDOL) 50000 UNITS CAPS capsule   . [DISCONTINUED] hydrOXYzine (ATARAX/VISTARIL) 50 MG tablet Take 1 tab at bed time   No facility-administered encounter medications on file as of 10/04/2014.    Past Psychiatric History/Hospitalization(s) Patient has done twice intensive outpatient program .  Patient denies any history of psychiatric inpatient treatment or any suicidal attempt however endorse history of mood swings irritability and anger.  She denies any psychosis or any hallucination.  She was referred to [place] from primary care physician who started her on Prozac but she stopped because of significant nausea and vomiting.  In the past she had tried Zoloft, Remeron with limited response.   Anxiety: Yes Bipolar Disorder: No Depression: Yes Mania: No Psychosis: No Schizophrenia: No Personality Disorder: No Hospitalization for psychiatric illness: No History of Electroconvulsive Shock Therapy: No Prior Suicide Attempts: No  Physical Exam: Constitutional:  BP 134/84 mmHg  Pulse 98  Ht 5\' 5"  (1.651 m)  Wt 180 lb 9.6 oz (81.92 kg)  BMI 30.05 kg/m2  LMP 12/19/2012  No results found for this or any previous visit (from the past 2160 hour(s)). Musculoskeletal: Strength & Muscle Tone: within normal limits Gait & Station: normal Patient leans: N/A  Mental Status Examination;  Patient is casually dressed and groomed.  Her speech is  clear and coherent.  She describes her mood euthymic and her affect is appropriate.  Her thought process is slow but logical and goal directed.  She denies any active or passive suicidal thoughts or homicidal thoughts.  She denies any auditory or visual hallucination.  Her fund of knowledge  is average.  There is no paranoia or delusions present at this time.  She's alert and oriented x3.  Her insight judgment and impulse control is okay.  Established Problem, Stable/Improving (1), Review of Last Therapy Session (1) and Review of Medication Regimen & Side Effects (2)  Assessment: Axis I: Anxiety disorder NOS   Axis II: Deferred  Axis III:  Past Medical History  Diagnosis Date  . Thyroid disease   . Hypothyroidism   . GERD (gastroesophageal reflux disease)   . Headache(784.0)   . HTN (hypertension)     no meds- per pt white coat syndrome  . Anemia     history of anemia  . Depression     Bipolar    Plan:  Patient is doing better without any medication.  She has used last Vistaril 2 months ago.  However she wants to keep the Vistaril in case her anxiety get worse.  She does not feel that she need to see therapist.  Her vitals are stable.  Recommended to call us back if she has any question or any concern.  I'll see her again in 6 months.  Feleshia Zundel T., MD 10/04/2014

## 2014-10-29 ENCOUNTER — Ambulatory Visit (INDEPENDENT_AMBULATORY_CARE_PROVIDER_SITE_OTHER): Payer: 59 | Admitting: Psychiatry

## 2014-10-29 DIAGNOSIS — F319 Bipolar disorder, unspecified: Secondary | ICD-10-CM

## 2014-10-30 NOTE — Progress Notes (Signed)
   THERAPIST PROGRESS NOTE  Session Time: 3:05-4:00  Participation Level: Active  Behavioral Response: CasualAlertEuthymic   Type of Therapy: Individual Therapy   Treatment Goals addressed: emotion regulation, stress management, coping skills   Interventions: CBT, supportive, strength-based   Summary: Diana Francis is a 34 y.o. female who presents with depression and anxiety.   Suicidal/Homicidal: nowithout intent/plan   Therapist Response: Pt. Presents for first session since March of this year. Pt. Reports that her work is going well and that she is happy in a long-distance relationship. Pt. Reports that most significant stressor is her relationship with her oldest son [name] who has ADHD and ODD. Pt.'s son was discharged from psychiatrist for refusing to take his medicine for ADHD. The son has made all Fs for current semester and remains defiant at home. Pt.'s son also discontinued with his therapist because he was not opening up to her and were not making progress. Made referral to therapist to address concerns about rejection from birth mother and acceptance of ADHD and anger management. 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 Pt. Continues to be challenged by setting appropriate boundaries and reward system with children. Pt. Acknowledges that she has spoiled her children with extravagant gifts and they often communicate that they are not grateful. Pt. Expressed strong concern for her children and desire to not give up on him despite the problems he is experiencing.   Plan: Continue with CBT and strength based approach. Return again in 4 weeks.   Diagnosis: Axis I: Depressive Disorder NOS  Axis II: deferred    Nancie Neas, [facility] 10/30/2014

## 2015-01-29 ENCOUNTER — Ambulatory Visit (HOSPITAL_COMMUNITY): Payer: Self-pay | Admitting: Psychiatry

## 2015-02-13 ENCOUNTER — Ambulatory Visit (INDEPENDENT_AMBULATORY_CARE_PROVIDER_SITE_OTHER): Payer: 59 | Admitting: Psychiatry

## 2015-02-13 DIAGNOSIS — F319 Bipolar disorder, unspecified: Secondary | ICD-10-CM

## 2015-02-15 NOTE — Progress Notes (Signed)
   THERAPIST PROGRESS NOTE  Session Time: 3:05-4:00  Participation Level: Active  Behavioral Response: CasualAlertEuthymic   Type of Therapy: Individual Therapy   Treatment Goals addressed: emotion regulation, stress management, coping skills   Interventions: CBT, supportive, strength-based   Summary: Diana Francis is a 35 y.o. female who presents with depression and anxiety.   Suicidal/Homicidal: nowithout intent/plan   Therapist Response: Pt. Presents for first session since October. Pt. Reports that she is doing well managing stress. Pt. Reports that she is going out occasionally with friends and dating. Pt. Reports that her relationship with her son has improved since discontinuing counseling for him and she is looking for a mentorship program for him. Pt. Discussed that she now has physical custody of her niece and nephew due to her daughter-in-law's mental illness and has committed her self to being supportive of her brother and doing what is required to take care of the children. Pt. Reports that she received a promotion at work and things are moving forward professionally. Pt. Reports recent weight gain, but is happy and comfortable with her body and feels healthy. Significant time in session focused on maintaining self-care while caring for multiple small children and utilizing community health resources for her brother's family.  Plan: Continue with CBT and strength based approach. Return again in 8 weeks.   Diagnosis: Axis I: Depressive Disorder NOS  Axis II: deferred    Nancie Neas, [facility] 02/15/2015

## 2015-04-01 ENCOUNTER — Emergency Department (HOSPITAL_COMMUNITY)
Admission: EM | Admit: 2015-04-01 | Discharge: 2015-04-01 | Disposition: A | Discharge status: Home / Self Care | Payer: Self-pay | Source: Home / Self Care

## 2015-04-01 ENCOUNTER — Encounter (HOSPITAL_COMMUNITY): Payer: Self-pay | Admitting: Emergency Medicine

## 2015-04-01 DIAGNOSIS — R51 Headache: Secondary | ICD-10-CM

## 2015-04-01 DIAGNOSIS — R519 Headache, unspecified: Secondary | ICD-10-CM

## 2015-04-01 DIAGNOSIS — I159 Secondary hypertension, unspecified: Secondary | ICD-10-CM
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
## 2015-04-03 ENCOUNTER — Ambulatory Visit (HOSPITAL_COMMUNITY): Payer: Self-pay | Admitting: Psychiatry

## 2015-04-03 ENCOUNTER — Encounter (HOSPITAL_COMMUNITY): Payer: Self-pay | Admitting: Vascular Surgery

## 2015-04-03 ENCOUNTER — Emergency Department (HOSPITAL_COMMUNITY)
Admission: EM | Admit: 2015-04-03 | Discharge: 2015-04-03 | Disposition: A | Discharge status: Home / Self Care | Payer: 59 | Attending: Emergency Medicine | Admitting: Emergency Medicine

## 2015-04-03 DIAGNOSIS — Z8719 Personal history of other diseases of the digestive system: Secondary | ICD-10-CM | POA: Diagnosis not present

## 2015-04-03 DIAGNOSIS — M62838 Other muscle spasm: Secondary | ICD-10-CM | POA: Diagnosis not present

## 2015-04-03 DIAGNOSIS — I10 Essential (primary) hypertension: Secondary | ICD-10-CM | POA: Insufficient documentation

## 2015-04-03 DIAGNOSIS — Y998 Other external cause status: Secondary | ICD-10-CM | POA: Insufficient documentation

## 2015-04-03 DIAGNOSIS — Z79899 Other long term (current) drug therapy: Secondary | ICD-10-CM | POA: Diagnosis not present

## 2015-04-03 DIAGNOSIS — Z8659 Personal history of other mental and behavioral disorders: Secondary | ICD-10-CM | POA: Diagnosis not present

## 2015-04-03 DIAGNOSIS — Z862 Personal history of diseases of the blood and blood-forming organs and certain disorders involving the immune mechanism: Secondary | ICD-10-CM | POA: Insufficient documentation

## 2015-04-03 DIAGNOSIS — S29002A Unspecified injury of muscle and tendon of back wall of thorax, initial encounter: Secondary | ICD-10-CM | POA: Insufficient documentation

## 2015-04-03 DIAGNOSIS — M549 Dorsalgia, unspecified: Secondary | ICD-10-CM

## 2015-04-03 DIAGNOSIS — Y9389 Activity, other specified: Secondary | ICD-10-CM | POA: Diagnosis not present

## 2015-04-03 DIAGNOSIS — S199XXA Unspecified injury of neck, initial encounter: Secondary | ICD-10-CM | POA: Diagnosis present

## 2015-04-03 DIAGNOSIS — Z8639 Personal history of other endocrine, nutritional and metabolic disease: Secondary | ICD-10-CM | POA: Diagnosis not present

## 2015-04-03 DIAGNOSIS — Y92410 Unspecified street and highway as the place of occurrence of the external cause: Secondary | ICD-10-CM | POA: Insufficient documentation

## 2015-04-03 DIAGNOSIS — S4992XA Unspecified injury of left shoulder and upper arm, initial encounter: Secondary | ICD-10-CM | POA: Insufficient documentation

## 2015-04-03 DIAGNOSIS — F17210 Nicotine dependence, cigarettes, uncomplicated: Secondary | ICD-10-CM | POA: Diagnosis not present

## 2015-04-03 MED ORDER — HYDROCODONE-ACETAMINOPHEN 5-325 MG PO TABS: 1.0000 | ORAL_TABLET | ORAL | Status: DC | PRN

## 2015-04-03 MED ORDER — NAPROXEN 500 MG PO TABS: 500.0000 mg | ORAL_TABLET | Freq: Two times a day (BID) | ORAL | Status: DC

## 2015-04-03 MED ORDER — METHOCARBAMOL 500 MG PO TABS: 500.0000 mg | ORAL_TABLET | Freq: Two times a day (BID) | ORAL | Status: DC
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
## 2015-04-25 ENCOUNTER — Ambulatory Visit (INDEPENDENT_AMBULATORY_CARE_PROVIDER_SITE_OTHER): Payer: 59 | Admitting: Psychiatry

## 2015-04-25 ENCOUNTER — Encounter (HOSPITAL_COMMUNITY): Payer: Self-pay | Admitting: Psychiatry

## 2015-04-25 VITALS — BP 128/82 | HR 81 | Ht 65.0 in | Wt 214.2 lb

## 2015-04-25 DIAGNOSIS — F411 Generalized anxiety disorder: Secondary | ICD-10-CM

## 2015-04-25 MED ORDER — HYDROXYZINE HCL 50 MG PO TABS: ORAL_TABLET | ORAL | Status: DC

## 2015-04-25 NOTE — Progress Notes (Signed)
[place] [phone] Progress Note  Diana Francis [id number] 35 y.o.  04/25/2015 4:41 PM  Chief Complaint:  I have gained weight.  I job is stressful.  I have taken few times Vistaril.          History of Present Illness. Diana Francis came for her followup appointment.  She has gained weight from the past.  She admitted lack of exercise and not going to the gym and having a lot of stress at work causing weight gain.  In past 6 months she has taken 3 or 4 times Vistaril which works very well for her sleep and anxiety.  She is also seeing Diana Francis for counseling.  Patient denies any irritability, anger, mood swing.  She denies any paranoia, hallucination, suicidal thoughts or homicidal thought.  She has anxiety which gets some time many intense and she could not sleep.  She is not interested to go back on a standing antidepressant .  Her energy level is okay but she is concerned about the weight gain.  Patient is seeing Diana Francis for counseling.  She is not drinking or using any illegal substances. Patient denies drinking or using any illegal substances.    Suicidal Ideation: No Plan Formed: No Patient has means to carry out plan: No  Homicidal Ideation: No Plan Formed: No Patient has means to carry out plan: No  Review of Systems  Constitutional: Negative for weight loss.  Cardiovascular: Negative for chest pain and palpitations.  Skin: Negative.   Neurological: Negative for tremors and headaches.  Psychiatric/Behavioral: Negative for depression, suicidal ideas, hallucinations, memory loss and substance abuse. The patient is not nervous/anxious.     Psychiatric: Agitation: No Hallucination: No Depressed Mood: No Insomnia: No Hypersomnia: No Altered Concentration: No Feels Worthless: No Grandiose Ideas: No Belief In Special Powers: No New/Increased Substance Abuse: No Compulsions: No  Neurologic: Headache: Yes Seizure: No Paresthesias: No  Medical  History;  Patient has hypertension, GERD and hypothyroidism.  She has hysterectomy .  Her primary care physician is Dr. Altha Francis at [facility].  She denies any history of traumatic brain injury, seizures or any concussion.   Outpatient Encounter Prescriptions as of 04/25/2015  Medication Sig  . hydrOXYzine (ATARAX/VISTARIL) 50 MG tablet Take 1 tab at bed time  . naproxen (NAPROSYN) 500 MG tablet Take 1 tablet (500 mg total) by mouth 2 (two) times daily with a meal.  . Vitamin D, Ergocalciferol, (DRISDOL) 50000 UNITS CAPS capsule   . [DISCONTINUED] HYDROcodone-acetaminophen (NORCO/VICODIN) 5-325 MG tablet Take 1 tablet by mouth every 4 (four) hours as needed.  . [DISCONTINUED] hydrOXYzine (ATARAX/VISTARIL) 50 MG tablet Take 1 tab at bed time  . [DISCONTINUED] methocarbamol (ROBAXIN) 500 MG tablet Take 1 tablet (500 mg total) by mouth 2 (two) times daily.  . [DISCONTINUED] QSYMIA 7.5-46 MG CP24 Take 1 capsule by mouth every morning.   No facility-administered encounter medications on file as of 04/25/2015.    Past Psychiatric History/Hospitalization(s) Patient has done twice intensive outpatient program .  Patient denies any history of psychiatric inpatient treatment or any suicidal attempt however endorse history of mood swings irritability and anger.  She denies any psychosis or any hallucination.  She was referred to [place] from primary care physician who started her on Prozac but she stopped because of significant nausea and vomiting.  In the past she had tried Zoloft, Remeron with limited response.   Anxiety: Yes Bipolar Disorder: No Depression: Yes Mania: No Psychosis: No Schizophrenia: No Personality Disorder:  No Hospitalization for psychiatric illness: No History of Electroconvulsive Shock Therapy: No Prior Suicide Attempts: No  Physical Exam: Constitutional:  BP 128/82 mmHg  Pulse 81  Ht 5\' 5"  (1.651 m)  Wt 214 lb 3.2 oz (97.16 kg)  BMI 35.64 kg/m2  LMP 12/19/2012  No  results found for this or any previous visit (from the past 2160 hour(s)). Musculoskeletal: Strength & Muscle Tone: within normal limits Gait & Station: normal Patient leans: N/A  Mental Status Examination;  Patient is casually dressed and groomed.  Her speech is clear and coherent.  She describes her mood euthymic and her affect is appropriate.  Her thought process is slow but logical and goal directed.  She denies any active or passive suicidal thoughts or homicidal thoughts.  She denies any auditory or visual hallucination.  Her fund of knowledge is average.  There is no paranoia or delusions present at this time.  She's alert and oriented x3.  Her insight judgment and impulse control is okay.  Established Problem, Stable/Improving (1), Review of Last Therapy Session (1) and Review of Medication Regimen & Side Effects (2)  Assessment: Axis I: Anxiety disorder NOS   Axis II: Deferred  Axis III:  Past Medical History  Diagnosis Date  . Thyroid disease   . Hypothyroidism   . GERD (gastroesophageal reflux disease)   . Headache(784.0)   . HTN (hypertension)     no meds- per pt white coat syndrome  . Anemia     history of anemia  . Depression     Bipolar    Plan:  Discuss weight gain and encouraged to have regular exercise and watching her calorie intake.  Continue Vistaril as needed for severe anxiety and insomnia.  Encouraged to keep appointment with Diana Francis for counseling.  Recommended to call us back if she has any question or any concern.  Follow-up in 6 months.  Nonnie Pickney T., MD 04/25/2015

## 2015-04-30 ENCOUNTER — Ambulatory Visit (HOSPITAL_COMMUNITY): Payer: Self-pay | Admitting: Psychiatry

## 2015-10-02 ENCOUNTER — Ambulatory Visit (INDEPENDENT_AMBULATORY_CARE_PROVIDER_SITE_OTHER): Payer: 59 | Admitting: Psychiatry

## 2015-10-02 DIAGNOSIS — F419 Anxiety disorder, unspecified: Secondary | ICD-10-CM | POA: Diagnosis not present

## 2015-10-02 DIAGNOSIS — F411 Generalized anxiety disorder: Secondary | ICD-10-CM

## 2015-10-03 ENCOUNTER — Encounter (HOSPITAL_COMMUNITY): Payer: Self-pay | Admitting: Psychiatry

## 2015-10-03 ENCOUNTER — Other Ambulatory Visit (HOSPITAL_COMMUNITY): Payer: 59 | Attending: Psychiatry | Admitting: Psychiatry

## 2015-10-03 DIAGNOSIS — F331 Major depressive disorder, recurrent, moderate: Secondary | ICD-10-CM | POA: Insufficient documentation
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
## 2015-10-04 ENCOUNTER — Telehealth (HOSPITAL_COMMUNITY): Payer: Self-pay | Admitting: Psychiatry

## 2015-10-04 ENCOUNTER — Other Ambulatory Visit (HOSPITAL_COMMUNITY): Payer: 59 | Admitting: Psychiatry

## 2015-10-04 NOTE — Progress Notes (Signed)
    Daily Group Progress Note  Program: IOP  Group Time: 9:00-12:00  Participation Level: Minimal  Behavioral Response: Appropriate  Type of Therapy:  Group Therapy  Summary of Progress: Pt. Met with psychiatrist and case manager for most of first half of group. Pt. Presented as tearful, anxious, and stated that she had a difficult time opening up in group settings. Pt. Discussed briefly challenges of parenting teenagers. Pt. Watched and discussed Shanda Howells video about acknowledging and treating psychological trauma.     Nancie Neas, LPC

## 2015-10-04 NOTE — Progress Notes (Signed)
   THERAPIST PROGRESS NOTE Session Time: 3:35-4:05  Participation Level: Active  Behavioral Response: CasualAlertTearful   Type of Therapy: Individual Therapy   Treatment Goals addressed: emotion regulation, stress management, coping skills   Interventions: CBT, supportive, strength-based   Summary: Diana Francis is a 35 y.o. female who presents with depression and anxiety.   Suicidal/Homicidal: nowithout intent/plan   Therapist Response: Pt. Presents for first session since January 2017. Pt. Presents as tearful, states that she has not been sleeping or eating for the last two weeks and that her mother recommended that she get some help. Pt. Reports that her father was murdered last month and that she has taken the loss extremely hard. Pt. Reports that she has not been able to focus at work and has depended on co-workers to cover for her. Session focused on orienting Pt. For IOP group. Pt. Was hesitant to return to IOP, but was given information on focus on developing social support, tools for emotion regulation, and grief/loss group.   Plan: Pt. To come for IOP orientation tomorrow.   Diagnosis: Axis I: Depressive Disorder NOS   Axis II: deferred     Nancie Neas, [facility] 10/04/2015
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
## 2015-10-07 ENCOUNTER — Other Ambulatory Visit (HOSPITAL_COMMUNITY): Payer: 59 | Admitting: Licensed Clinical Social Worker

## 2015-10-07 DIAGNOSIS — F331 Major depressive disorder, recurrent, moderate: Secondary | ICD-10-CM | POA: Diagnosis not present

## 2015-10-07 DIAGNOSIS — F411 Generalized anxiety disorder: Secondary | ICD-10-CM

## 2015-10-08 ENCOUNTER — Other Ambulatory Visit (HOSPITAL_COMMUNITY): Payer: 59 | Admitting: Psychiatry

## 2015-10-08 DIAGNOSIS — F331 Major depressive disorder, recurrent, moderate: Secondary | ICD-10-CM | POA: Diagnosis not present

## 2015-10-08 DIAGNOSIS — F411 Generalized anxiety disorder: Secondary | ICD-10-CM

## 2015-10-09 ENCOUNTER — Other Ambulatory Visit (HOSPITAL_COMMUNITY): Payer: 59 | Admitting: Psychiatry

## 2015-10-09 DIAGNOSIS — F331 Major depressive disorder, recurrent, moderate: Secondary | ICD-10-CM | POA: Diagnosis not present

## 2015-10-09 DIAGNOSIS — F411 Generalized anxiety disorder: Secondary | ICD-10-CM

## 2015-10-09 NOTE — Progress Notes (Signed)
Daily Group Progress Note  Program: IOP   Group Time: 10:45-12:00pm  Participation Level: Active  Behavioral Response: Appropriate  Type of Therapy:  Psychoeducation  Summary of Progress: Pt participated in a discussion on destructive family secrets. Families are support systems which encourage the ability to form close relationships. Family members depend upon the trust and communication of loved ones. Pt discussed how her family secrets have manifested into a spiraling depression and anxiety state. Pt was encouraged to use her communication and problem-solving skills to help her work through family dysfunction.   Alver Fisher, LCASA

## 2015-10-09 NOTE — Progress Notes (Signed)
Patient ID: Diana Francis, female   DOB: 1980-07-07, 35 y.o.   MRN: [id number]                       Daily Group Progress Note                                                       Program: IOP      Time: 9:00-12:00 Activity Level: active Behavioral Response: engaged, responsive Type of Therapy: Psychoeducation/Group Therapy Summary: Patient presented with a very optimistic theme in group today. The Pharmacist shared the different aspects of psych. medications, and answered patient's questions about medication use. Pt participated in a discussion on the effects her anxiety has on her job. Therapist encouraged pt to incorporate her coping skills to diminish the symptoms of her anxiety.     Alver Fisher, LCAS-A

## 2015-10-09 NOTE — Progress Notes (Signed)
    Daily Group Progress Note  Program: IOP  Group Time: 9:00-10:45   Participation Level:  active   Behavioral Response: engaged   Type of Therapy:   group therapy   Summary of Progress: Client was alert and active during group.  Client discussed stress related to being out of work.  Client is experiencing high anxiety around the death of her father and fearing for her own safety.  Client told her brother and sister-in-law they need to move out, which was a cause of a lot of stress.  Counselor encouraged client to continue to set healthy boundaries.   Nancie Neas, LPC

## 2015-10-10 ENCOUNTER — Other Ambulatory Visit (HOSPITAL_COMMUNITY): Payer: 59 | Admitting: Psychiatry

## 2015-10-10 DIAGNOSIS — F331 Major depressive disorder, recurrent, moderate: Secondary | ICD-10-CM | POA: Diagnosis not present

## 2015-10-10 DIAGNOSIS — F411 Generalized anxiety disorder: Secondary | ICD-10-CM

## 2015-10-10 NOTE — Progress Notes (Signed)
    Daily Group Progress Note  Program: IOP  Group Time: 9:00-12:00   Participation Level:  active   Behavioral Response:  engaged   Type of Therapy: group therapy   Summary of Progress: Client reported having a rough night. Her two year old niece was desirous of her company, which she did not want to share. So client attempted to ignore her niece, until her niece had a crying fit outside her bedroom door. She then relented and allowed her niece to spend the night sleeping in her bed. Client also reported feeling very agitated this morning, and that she emotionally snapped on her children, which is something she doesn't usually do. Clients mom was supposed to drive the kids to school, but refused to do so this morning. Client's children called her out for taking on other peoples problems to the detriment of all of them. This stunned client, but she reported understanding the validity of what her children told her. Client attributed her receptivity to this feedback to her time spent in counseling. Client discussed some anhedonia in relation to food, and counselor suggested client consider altering her meds. Client discussed in depth the experience she had of her father in childhood, and also the events surrounding his death.   Nancie Neas, LPC

## 2015-10-10 NOTE — Progress Notes (Signed)
    Daily Group Progress Note  Program: IOP  Group Time: 9:00-10:45  Participation Level: Active  Behavioral Response: Appropriate  Type of Therapy:  Group Therapy  Summary of Progress: Pt. Reported that she was sick from allergies. Pt. Was engaged in group process, talkative, responsive to group members and therapist. Pt. Discussed her sadness related to the death of her father and disappointment from family members after his death.     Group Time: 10:45-12:00  Participation Level:  None  Behavioral Response: Pt. Did not attend reporting discomfort from upper respiratory distress.  Type of Therapy: Psycho-education Group  Summary of Progress: Pt. Did not attend group and loss with Jeanella Craze due to illness.  Nancie Neas, LPC

## 2015-10-11 ENCOUNTER — Other Ambulatory Visit (HOSPITAL_COMMUNITY): Payer: 59

## 2015-10-14 ENCOUNTER — Other Ambulatory Visit (HOSPITAL_COMMUNITY): Payer: 59 | Admitting: Psychiatry

## 2015-10-14 DIAGNOSIS — F4310 Post-traumatic stress disorder, unspecified: Secondary | ICD-10-CM

## 2015-10-14 DIAGNOSIS — F32A Depression, unspecified: Secondary | ICD-10-CM

## 2015-10-14 DIAGNOSIS — F331 Major depressive disorder, recurrent, moderate: Secondary | ICD-10-CM | POA: Diagnosis not present

## 2015-10-14 DIAGNOSIS — F329 Major depressive disorder, single episode, unspecified: Secondary | ICD-10-CM

## 2015-10-15 ENCOUNTER — Telehealth (HOSPITAL_COMMUNITY): Payer: Self-pay | Admitting: Psychiatry

## 2015-10-15 ENCOUNTER — Other Ambulatory Visit (HOSPITAL_COMMUNITY): Payer: 59 | Admitting: Psychiatry

## 2015-10-16 ENCOUNTER — Other Ambulatory Visit (HOSPITAL_COMMUNITY): Payer: 59 | Admitting: Psychiatry

## 2015-10-16 DIAGNOSIS — F32A Depression, unspecified: Secondary | ICD-10-CM

## 2015-10-16 DIAGNOSIS — F331 Major depressive disorder, recurrent, moderate: Secondary | ICD-10-CM | POA: Diagnosis not present

## 2015-10-16 DIAGNOSIS — F329 Major depressive disorder, single episode, unspecified: Secondary | ICD-10-CM

## 2015-10-16 DIAGNOSIS — F411 Generalized anxiety disorder: Secondary | ICD-10-CM
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
## 2015-10-16 NOTE — Progress Notes (Signed)
Daily Group Progress Note  Program: IOP    Group Time: 10:45-12:00 pm  Participation Level: Active  Behavioral Response: Appropriate  Type of Therapy:  Psychotherapy  Summary of Progress:  Pt participated in chair yoga, facilitated by [facility] therapist and certified yoga instructor Jan Fireman. The activity focused on breath with movement. For patients with anxiety and depression as part of self-care and mindfulness, yoga helps train the relaxation response.    Jenkins Rouge, LCAS-A

## 2015-10-17 ENCOUNTER — Encounter (HOSPITAL_COMMUNITY): Payer: Self-pay | Admitting: Licensed Clinical Social Worker

## 2015-10-17 ENCOUNTER — Other Ambulatory Visit (HOSPITAL_COMMUNITY): Payer: 59 | Admitting: Psychiatry

## 2015-10-17 DIAGNOSIS — F32A Depression, unspecified: Secondary | ICD-10-CM

## 2015-10-17 DIAGNOSIS — F329 Major depressive disorder, single episode, unspecified: Secondary | ICD-10-CM

## 2015-10-17 DIAGNOSIS — F331 Major depressive disorder, recurrent, moderate: Secondary | ICD-10-CM | POA: Diagnosis not present

## 2015-10-17 NOTE — Progress Notes (Signed)
    Daily Group Progress Note  Program: IOP  Group Time: 9:00-12:00  Participation Level: Active  Behavioral Response: Appropriate  Type of Therapy:  Group Therapy  Summary of Progress: Pt. Presented with brightened affect, engaged in group process. Pt. Reported that she suffered another loss with death her father's friend. Pt. Discussed the challenged of engaging in self-care and that it has been easier for her to take care of others than to take care of herself. Pt. Shared that she did not enjoy the yoga because it was too slow for her. Participated in discussion about the benefits of yoga in light of yesterday's yoga session in group and the importance of developing self-care behaviors.   Nancie Neas, LPC

## 2015-10-18 ENCOUNTER — Other Ambulatory Visit (HOSPITAL_COMMUNITY): Payer: 59 | Admitting: Psychiatry

## 2015-10-18 DIAGNOSIS — F32A Depression, unspecified: Secondary | ICD-10-CM

## 2015-10-18 DIAGNOSIS — F329 Major depressive disorder, single episode, unspecified: Secondary | ICD-10-CM

## 2015-10-18 DIAGNOSIS — F331 Major depressive disorder, recurrent, moderate: Secondary | ICD-10-CM | POA: Diagnosis not present

## 2015-10-18 NOTE — Progress Notes (Signed)
    Daily Group Progress Note  Program: IOP  Group Time: 9:00-12:00   Participation Level:active    Behavioral Response:  responsive   Type of Therapy:   group therapy   Summary of Progress: Client reported sleeping less than usual due to her son being sick. She recognizes she continues to struggle with taking responsibility for others.  However, she also reports that she has sent her niece and nephew (who she was taking care of) to live with her mother, because her youngest son indicated to her that he needs more of her attention. Client felt ambivalent about this move, but also expressed she feels this is the right thing to do for her own children.   Nancie Neas, LPC

## 2015-10-21 ENCOUNTER — Other Ambulatory Visit (HOSPITAL_COMMUNITY): Payer: 59

## 2015-10-21 ENCOUNTER — Other Ambulatory Visit (HOSPITAL_COMMUNITY): Payer: 59 | Admitting: Psychiatry

## 2015-10-21 DIAGNOSIS — F331 Major depressive disorder, recurrent, moderate: Secondary | ICD-10-CM | POA: Diagnosis not present

## 2015-10-21 DIAGNOSIS — F32A Depression, unspecified: Secondary | ICD-10-CM

## 2015-10-21 DIAGNOSIS — F329 Major depressive disorder, single episode, unspecified: Secondary | ICD-10-CM

## 2015-10-22 ENCOUNTER — Other Ambulatory Visit (HOSPITAL_COMMUNITY): Payer: 59

## 2015-10-22 ENCOUNTER — Other Ambulatory Visit (HOSPITAL_COMMUNITY): Payer: 59 | Admitting: Psychiatry

## 2015-10-22 DIAGNOSIS — F331 Major depressive disorder, recurrent, moderate: Secondary | ICD-10-CM

## 2015-10-22 MED ORDER — LORAZEPAM 0.5 MG PO TABS: 0.5000 mg | ORAL_TABLET | Freq: Two times a day (BID) | ORAL | 0 refills | Status: DC

## 2015-10-22 MED ORDER — LAMOTRIGINE 25 MG PO TABS: 25.0000 mg | ORAL_TABLET | Freq: Every day | ORAL | 2 refills | Status: DC
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
## 2015-10-22 NOTE — Progress Notes (Signed)
    Daily Group Progress Note  Program: IOP  Group Time: 9:00-12:00   Participation Level:  active    Behavioral Response: responsive   Type of Therapy:  group therapy   Summary of Progress: Client reported struggling with the loss of her father. She feels a lot of anger an frustration in relation to these feelings of grief. She wants closure, but also accepts that she cannot ever fully know why what happened to him did. At present she has a new relationship with her uncle, however she has set some boundaries around that relationship, as she fears he's been trying to replace her father. Client also set boundaries with an aunt she does not like. She curtly informed her aunt she does not like her, and no longer wishes to speak with her. Client believes by setting this boundary she is modeling positive behavior for her children.   Nancie Neas, LPC

## 2015-10-22 NOTE — Patient Instructions (Signed)
Pt completed [facility] today.  Will follow up with [name], LPC on 10-28-15 @ 1 pm and Dr. Adele Schilder on 11-14-15 @ 2pm.  Encouraged support groups.  Pt will follow [facility].

## 2015-10-23 ENCOUNTER — Other Ambulatory Visit (HOSPITAL_COMMUNITY): Payer: 59

## 2015-10-23 NOTE — Progress Notes (Signed)
    Daily Group Progress Note  Program: IOP  Group Time: 9:00-12:00   Participation Level:  active    Behavioral Response:responsive   Type of Therapy:  group therapy   Summary of Progress: Client reported feeling like she's backsliding, and that she's struggling with panic. She reported her brother's best friend was recently killed, and so was an uncle. Client offered to help with the funeral of her brothers friend, and feels this has re-traumatized her to some degree. She had a panic attack the day before when in the car with her kids. She was able to pull off the road, but this incident frustrated her because she wants to move beyond her panic/anxiety issues. Counselor led the group in a safe place guided meditation, and suggested client use this regularly as a means to help her combat her panic. During the drawing exercise client drew a dark maze without an exit, and likened that to her anxiety.   Nancie Neas, LPC

## 2015-10-23 NOTE — Progress Notes (Signed)
    Daily Group Progress Note  Program: IOP  Group Time: 9:00-10:30  Participation Level: Active  Behavioral Response: Appropriate  Type of Therapy:  Group Therapy  Summary of Progress: Pt. Presented as "aggravated and angry" regarding her home life. Pt. Appeared angry and anxious, responsive to questions from other group members and the counselor. Pt. Shared that she is very angry because her brother and sister-in-law she believes are unfit parents and she has responsibility for taking care of their children. Pt. Discussed reducing her stress by setting a date for her brother and sister-in-law to move out of her home and seeking custody of the children. Pt. Participated in discussion about letting go of control of situations that are not our personal or moral responsibility and setting personal boundaries in relationships in order to protect ourselves from patterns of excessive caretaking and resulting resentment.      Nancie Neas, LPC

## 2015-10-24 ENCOUNTER — Other Ambulatory Visit (HOSPITAL_COMMUNITY): Payer: 59

## 2015-10-25 ENCOUNTER — Other Ambulatory Visit (HOSPITAL_COMMUNITY): Payer: 59

## 2015-10-28 ENCOUNTER — Other Ambulatory Visit (HOSPITAL_COMMUNITY): Payer: 59

## 2015-10-28 ENCOUNTER — Other Ambulatory Visit (HOSPITAL_COMMUNITY): Payer: 59 | Admitting: Psychiatry

## 2015-10-28 ENCOUNTER — Ambulatory Visit (HOSPITAL_COMMUNITY): Payer: Self-pay | Admitting: Psychiatry

## 2015-10-28 ENCOUNTER — Ambulatory Visit (INDEPENDENT_AMBULATORY_CARE_PROVIDER_SITE_OTHER): Payer: 59 | Admitting: Psychiatry

## 2015-10-28 DIAGNOSIS — F331 Major depressive disorder, recurrent, moderate: Secondary | ICD-10-CM

## 2015-10-29 ENCOUNTER — Other Ambulatory Visit (HOSPITAL_COMMUNITY): Payer: 59

## 2015-10-30 ENCOUNTER — Other Ambulatory Visit (HOSPITAL_COMMUNITY): Payer: 59

## 2015-10-30 NOTE — Progress Notes (Signed)
   THERAPIST PROGRESS NOTE  Session Time: 1:10-2:00  Participation Level: Active  Behavioral Response: CasualAlertEuthymic   Type of Therapy: Individual Therapy   Treatment Goals addressed: emotion regulation, stress management, coping skills   Interventions: CBT, supportive, strength-based   Summary: Diana Francis is a 35 y.o. female who presents with depression and anxiety.   Suicidal/Homicidal: nowithout intent/plan   Therapist Response: Pt. Presents for first session since discharging from [facility]. Pt. Continues to process loss of her father and her disappointment with the response from family members who seem to be more interested in how they can benefit from his death. Pt. Continues to report feeling emotionally overwhelmed and needing time and space to grieve. Time spent in session considering the benefits and costs associated with asking for support from her mother with care of her children for the short-term while she is feeling overwhelmed. Pt. Also processed boundaries needed in relationships with her brother and sister-in-law who have history of not respecting her personal boundaries and needing caregiving that she cannot provide.  Plan: Continue with CBT and strength based approach. Return again in 2 weeks.   Diagnosis: Axis I: Depressive Disorder NOS    Axis II: deferred    Nancie Neas, [facility] 10/30/2015

## 2015-10-31 ENCOUNTER — Other Ambulatory Visit (HOSPITAL_COMMUNITY): Payer: 59

## 2015-11-01 ENCOUNTER — Other Ambulatory Visit (HOSPITAL_COMMUNITY): Payer: 59

## 2015-11-04 ENCOUNTER — Other Ambulatory Visit (HOSPITAL_COMMUNITY): Payer: 59

## 2015-11-05 ENCOUNTER — Other Ambulatory Visit (HOSPITAL_COMMUNITY): Payer: 59

## 2015-11-06 ENCOUNTER — Other Ambulatory Visit (HOSPITAL_COMMUNITY): Payer: 59

## 2015-11-07 ENCOUNTER — Other Ambulatory Visit (HOSPITAL_COMMUNITY): Payer: 59

## 2015-11-08 ENCOUNTER — Other Ambulatory Visit (HOSPITAL_COMMUNITY): Payer: 59

## 2015-11-11 ENCOUNTER — Other Ambulatory Visit (HOSPITAL_COMMUNITY): Payer: 59

## 2015-11-12 ENCOUNTER — Other Ambulatory Visit (HOSPITAL_COMMUNITY): Payer: 59

## 2015-11-13 ENCOUNTER — Other Ambulatory Visit (HOSPITAL_COMMUNITY): Payer: 59

## 2015-11-14 ENCOUNTER — Other Ambulatory Visit (HOSPITAL_COMMUNITY): Payer: 59

## 2015-11-14 ENCOUNTER — Ambulatory Visit (INDEPENDENT_AMBULATORY_CARE_PROVIDER_SITE_OTHER): Payer: 59 | Admitting: Psychiatry

## 2015-11-14 ENCOUNTER — Encounter (HOSPITAL_COMMUNITY): Payer: Self-pay | Admitting: Psychiatry

## 2015-11-14 VITALS — BP 138/82 | HR 97 | Ht 65.0 in | Wt 186.6 lb

## 2015-11-14 DIAGNOSIS — F411 Generalized anxiety disorder: Secondary | ICD-10-CM | POA: Diagnosis not present

## 2015-11-14 DIAGNOSIS — F321 Major depressive disorder, single episode, moderate: Secondary | ICD-10-CM

## 2015-11-14 MED ORDER — HYDROXYZINE HCL 50 MG PO TABS: ORAL_TABLET | ORAL | 1 refills | Status: DC

## 2015-11-14 MED ORDER — LAMOTRIGINE 25 MG PO TABS: ORAL_TABLET | ORAL | 1 refills | Status: DC

## 2015-11-14 NOTE — Progress Notes (Signed)
[facility] [phone] Progress Note  Diana Francis [id number] 35 y.o.  11/14/2015 3:03 PM  Chief Complaint:  My father was murdered in August.  I'm going through grief.  I'm very depressed.            History of Present Illness. Diana Francis came for her followup appointment.  She was last seen in March 2017 by this provider.  In August her father was murdered and she gone through a lot.  She finished intensive outpatient program and she was prescribed Lamictal and lorazepam however she has not started Lamictal yet.  She wants to discuss with me first.  In the past she had a good response with Lamictal.  She admitted lately more isolated, withdrawn, irritable and angry.  She is very irritable with the family members who do not show up in the Court discuss her father's will.  She admitted crying spells, poor sleep, lack of appetite .  She has been not gone to work since September 6.  She is currently living with her brother because she does not want to live by herself.  She is taking lorazepam as needed and Vistaril at night for sleep.  Patient denies any paranoia, hallucination, suicidal thoughts or homicidal thought.  Her energy level is low.  She has lost more than 15 pounds in past few months.  She is thinking to see a therapist at hospice because of grief.  Patient denies drinking alcohol or using any illegal substances.  Suicidal Ideation: No Plan Formed: No Patient has means to carry out plan: No  Homicidal Ideation: No Plan Formed: No Patient has means to carry out plan: No  Review of Systems  Constitutional: Negative for weight loss.  Cardiovascular: Negative for chest pain and palpitations.  Skin: Negative.   Neurological: Negative for tremors and headaches.  Psychiatric/Behavioral: Positive for depression. Negative for hallucinations, memory loss, substance abuse and suicidal ideas. The patient is nervous/anxious and has insomnia.     Psychiatric: Agitation:  No Hallucination: No Depressed Mood: No Insomnia: No Hypersomnia: No Altered Concentration: No Feels Worthless: No Grandiose Ideas: No Belief In Special Powers: No New/Increased Substance Abuse: No Compulsions: No  Neurologic: Headache: Yes Seizure: No Paresthesias: No  Medical History;  Patient has hypertension, GERD and hypothyroidism.  She has hysterectomy .  Her primary care physician is Dr. Altha Harm at [facility].  She denies any history of traumatic brain injury, seizures or any concussion.   Outpatient Encounter Prescriptions as of 11/14/2015  Medication Sig Dispense Refill  . hydrOXYzine (ATARAX/VISTARIL) 50 MG tablet Take 1 tab at bed time 30 tablet 1  . lamoTRIgine (LAMICTAL) 25 MG tablet Take 1 tab daily for 1 week and than 2 tab daily 60 tablet 1  . LORazepam (ATIVAN) 0.5 MG tablet Take 1 tablet (0.5 mg total) by mouth 2 (two) times daily. 60 tablet 0  . naproxen (NAPROSYN) 500 MG tablet Take 1 tablet (500 mg total) by mouth 2 (two) times daily with a meal. (Patient not taking: Reported on 10/03/2015) 30 tablet 0  . Vitamin D, Ergocalciferol, (DRISDOL) 50000 UNITS CAPS capsule     . [DISCONTINUED] hydrOXYzine (ATARAX/VISTARIL) 50 MG tablet Take 1 tab at bed time (Patient not taking: Reported on 10/03/2015) 30 tablet 0  . [DISCONTINUED] lamoTRIgine (LAMICTAL) 25 MG tablet Take 1 tablet (25 mg total) by mouth daily. 30 tablet 2   No facility-administered encounter medications on file as of 11/14/2015.     Past Psychiatric History/Hospitalization(s) Patient has  done twice intensive outpatient program .  Patient denies any history of psychiatric inpatient treatment or any suicidal attempt however endorse history of mood swings irritability and anger.  She denies any psychosis or any hallucination.  She was referred to [place] from primary care physician who started her on Prozac but she stopped because of significant nausea and vomiting.  In the past she had tried Zoloft,  Remeron with limited response.   Anxiety: Yes Bipolar Disorder: No Depression: Yes Mania: No Psychosis: No Schizophrenia: No Personality Disorder: No Hospitalization for psychiatric illness: No History of Electroconvulsive Shock Therapy: No Prior Suicide Attempts: No  Physical Exam: Constitutional:  BP 138/82   Pulse 97   Ht 5\' 5"  (1.651 m)   Wt 186 lb 9.6 oz (84.6 kg)   LMP 12/19/2012   BMI 31.05 kg/m   No results found for this or any previous visit (from the past 2160 hour(s)). Musculoskeletal: Strength & Muscle Tone: within normal limits Gait & Station: normal Patient leans: N/A  Mental Status Examination;  Patient is casually dressed and groomed.  She described her mood sad depressed and her affect is constricted.  Her attention and concentration is fair.  She maintained fair eye contact.  Her speech is slow but clear and coherent.  Her thought process is slow but logical and goal directed.  She denies any active or passive suicidal thoughts or homicidal thoughts.  She denies any auditory or visual hallucination.  Her fund of knowledge is average.  There is no paranoia or delusions present at this time.  She's alert and oriented x3.  Her insight judgment and impulse control is okay.  Established Problem, Stable/Improving (1), New problem, with additional work up planned, Review of Psycho-Social Stressors (1), Review and summation of old records (2), Established Problem, Worsening (2), Review of Last Therapy Session (1), Review of Medication Regimen & Side Effects (2) and Review of New Medication or Change in Dosage (2)  Assessment: Axis I: Anxiety disorder NOS, major depressive disorder, recurrent.  Grief  Axis II: Deferred  Axis III:  Past Medical History:  Diagnosis Date  . Anemia    history of anemia  . Depression    Bipolar  . GERD (gastroesophageal reflux disease)   . Headache(784.0)   . HTN (hypertension)    no meds- per pt white coat syndrome  .  Hypothyroidism   . Thyroid disease     Plan:  I discussed psychosocial stressors and recent loss of her father.  Encouraged to continue grief counseling with hospice.  Recommended to restart Lamictal 25 mg daily which she has not started yet.  Recommended to increase 50 mg daily after one week .  Continue lorazepam only if she is very anxious .  She still have a few tablets remaining.  Continue Vistaril at bedtime.  Discussed medication side effects and benefits specially if she has a rash with Lamictal than she needed to stop the medication immediately.  Recommended to call us back if she has any question or any concern.  Discuss safety plan that anytime having active suicidal thoughts or homicidal thoughts and she need to call 911 or go to the local emergency room.  Patient will be out of work for another 4-6 weeks until to be seen in the office.  Follow-up in 4-6 weeks.    Keandria Berrocal T., MD 11/14/2015                      Patient ID: Diana Francis  Diana Francis, female   DOB: 11/23/1980, 35 y.o.   MRN: [id number]

## 2015-11-15 ENCOUNTER — Other Ambulatory Visit (HOSPITAL_COMMUNITY): Payer: 59

## 2015-11-18 ENCOUNTER — Other Ambulatory Visit (HOSPITAL_COMMUNITY): Payer: 59

## 2015-11-19 ENCOUNTER — Other Ambulatory Visit (HOSPITAL_COMMUNITY): Payer: 59

## 2015-11-20 ENCOUNTER — Other Ambulatory Visit (HOSPITAL_COMMUNITY): Payer: 59

## 2015-11-21 ENCOUNTER — Other Ambulatory Visit (HOSPITAL_COMMUNITY): Payer: 59

## 2015-11-22 ENCOUNTER — Other Ambulatory Visit (HOSPITAL_COMMUNITY): Payer: 59

## 2015-11-25 ENCOUNTER — Other Ambulatory Visit (HOSPITAL_COMMUNITY): Payer: 59

## 2015-11-26 ENCOUNTER — Ambulatory Visit (HOSPITAL_COMMUNITY): Payer: Self-pay | Admitting: Psychiatry

## 2015-11-26 ENCOUNTER — Other Ambulatory Visit (HOSPITAL_COMMUNITY): Payer: 59

## 2015-11-27 ENCOUNTER — Other Ambulatory Visit (HOSPITAL_COMMUNITY): Payer: 59

## 2015-11-28 ENCOUNTER — Ambulatory Visit (HOSPITAL_COMMUNITY): Payer: Self-pay | Admitting: Psychiatry

## 2015-12-02 ENCOUNTER — Emergency Department (HOSPITAL_COMMUNITY)
Admission: EM | Admit: 2015-12-02 | Discharge: 2015-12-02 | Disposition: A | Discharge status: Home / Self Care | Payer: 59 | Attending: Emergency Medicine | Admitting: Emergency Medicine

## 2015-12-02 ENCOUNTER — Encounter (HOSPITAL_COMMUNITY): Payer: Self-pay

## 2015-12-02 DIAGNOSIS — T887XXA Unspecified adverse effect of drug or medicament, initial encounter: Secondary | ICD-10-CM | POA: Diagnosis not present

## 2015-12-02 DIAGNOSIS — T50905A Adverse effect of unspecified drugs, medicaments and biological substances, initial encounter: Secondary | ICD-10-CM

## 2015-12-02 DIAGNOSIS — T370X5A Adverse effect of sulfonamides, initial encounter: Secondary | ICD-10-CM | POA: Diagnosis not present

## 2015-12-02 DIAGNOSIS — R112 Nausea with vomiting, unspecified: Secondary | ICD-10-CM | POA: Insufficient documentation

## 2015-12-02 DIAGNOSIS — Y829 Unspecified medical devices associated with adverse incidents: Secondary | ICD-10-CM | POA: Diagnosis not present

## 2015-12-02 DIAGNOSIS — F17210 Nicotine dependence, cigarettes, uncomplicated: Secondary | ICD-10-CM | POA: Insufficient documentation

## 2015-12-02 DIAGNOSIS — E039 Hypothyroidism, unspecified: Secondary | ICD-10-CM | POA: Diagnosis not present

## 2015-12-02 DIAGNOSIS — I10 Essential (primary) hypertension: Secondary | ICD-10-CM | POA: Diagnosis not present

## 2015-12-02 LAB — COMPREHENSIVE METABOLIC PANEL
ALBUMIN: 3.6 g/dL (ref 3.5–5.0)
ALK PHOS: 119 U/L (ref 38–126)
ALT: 21 U/L (ref 14–54)
ANION GAP: 11 (ref 5–15)
AST: 40 U/L (ref 15–41)
BILIRUBIN TOTAL: 2.4 mg/dL — AB (ref 0.3–1.2)
BUN: <5 mg/dL — ABNORMAL LOW (ref 6–20)
CALCIUM: 9.2 mg/dL (ref 8.9–10.3)
CO2: 18 mmol/L — ABNORMAL LOW (ref 22–32)
Chloride: 105 mmol/L (ref 101–111)
Creatinine, Ser: 0.84 mg/dL (ref 0.44–1.00)
GFR calc Af Amer: >60 mL/min (ref >60)
GFR calc non Af Amer: >60 mL/min (ref >60)
GLUCOSE: 96 mg/dL (ref 65–99)
Potassium: 5.3 mmol/L — ABNORMAL HIGH (ref 3.5–5.1)
Sodium: 134 mmol/L — ABNORMAL LOW (ref 135–145)
TOTAL PROTEIN: 7.5 g/dL (ref 6.5–8.1)

## 2015-12-02 LAB — CBC WITH DIFFERENTIAL/PLATELET
BASOS ABS: 0 10*3/uL (ref 0.0–0.1)
BASOS PCT: 0 %
EOS PCT: 1 %
Eosinophils Absolute: 0.1 10*3/uL (ref 0.0–0.7)
HCT: 41.6 % (ref 36.0–46.0)
Hemoglobin: 14.1 g/dL (ref 12.0–15.0)
Lymphocytes Relative: 30 %
Lymphs Abs: 3.2 10*3/uL (ref 0.7–4.0)
MCH: 30.9 pg (ref 26.0–34.0)
MCHC: 33.9 g/dL (ref 30.0–36.0)
MCV: 91 fL (ref 78.0–100.0)
MONO ABS: 0.8 10*3/uL (ref 0.1–1.0)
Monocytes Relative: 7 %
NEUTROS ABS: 6.7 10*3/uL (ref 1.7–7.7)
Neutrophils Relative %: 62 %
PLATELETS: 348 10*3/uL (ref 150–400)
RBC: 4.57 MIL/uL (ref 3.87–5.11)
RDW: 14.7 % (ref 11.5–15.5)
WBC: 10.9 10*3/uL — ABNORMAL HIGH (ref 4.0–10.5)

## 2015-12-02 LAB — URINALYSIS, ROUTINE W REFLEX MICROSCOPIC
Bilirubin Urine: NEGATIVE
Glucose, UA: NEGATIVE mg/dL
Hgb urine dipstick: NEGATIVE
KETONES UR: NEGATIVE mg/dL
LEUKOCYTES UA: NEGATIVE
NITRITE: NEGATIVE
PROTEIN: NEGATIVE mg/dL
Specific Gravity, Urine: 1.018 (ref 1.005–1.030)
pH: 6 (ref 5.0–8.0)

## 2015-12-02 LAB — LIPASE, BLOOD: Lipase: 25 U/L (ref 11–51)

## 2015-12-02 MED ORDER — SODIUM CHLORIDE 0.9 % IV BOLUS (SEPSIS)
1000.0000 mL | Freq: Once | INTRAVENOUS | Status: AC
  Administered 2015-12-02: 1000 mL via INTRAVENOUS

## 2015-12-02 MED ORDER — ONDANSETRON HCL 4 MG/2ML IJ SOLN
4.0000 mg | Freq: Once | INTRAMUSCULAR | Status: AC
  Administered 2015-12-02: 4 mg via INTRAVENOUS
  Filled 2015-12-02: qty 2

## 2015-12-02 MED ORDER — ONDANSETRON 4 MG PO TBDP: 4.0000 mg | ORAL_TABLET | Freq: Three times a day (TID) | ORAL | 0 refills | Status: DC | PRN

## 2015-12-02 NOTE — ED Notes (Signed)
ED Provider at bedside. 

## 2015-12-02 NOTE — ED Triage Notes (Signed)
Pt reports she had an abscess drained last week and was d/c with Bactrim. Pt reports ever since she has taken that she has been vomiting. Pt reports she is unable to keep anything on her stomach.

## 2015-12-02 NOTE — ED Notes (Signed)
Patient given gingerale for PO challenge per MD 

## 2015-12-02 NOTE — ED Provider Notes (Signed)
[facility] DEPT Provider Note   CSN: [id number] Arrival date & time: 12/02/15  [id number]     History   Chief Complaint Chief Complaint  Patient presents with  . Emesis    HPI Diana Francis is a 35 y.o. female.  Pt presents to the ED today with n/v.  She said that she had an abscess drained last week and has been on bactrim.  Since then, she has been vomiting.  She is actively vomiting on my initial exam, so history is limited.  After pt given zofran, additional history obtained.  She denies any f/c.  She does have some upper abdominal pain.  The abscess has improved.      Past Medical History:  Diagnosis Date  . Anemia    history of anemia  . Depression    Bipolar  . GERD (gastroesophageal reflux disease)   . Headache(784.0)   . HTN (hypertension)    no meds- per pt white coat syndrome  . Hypothyroidism   . Thyroid disease     Patient Active Problem List   Diagnosis Date Noted  . Depression, major, recurrent, moderate ([place]) 10/03/2015    Class: Chronic  . Depression 12/13/2012  . PTSD (post-traumatic stress disorder) 09/27/2012    Past Surgical History:  Procedure Laterality Date  . DILATION AND CURETTAGE OF UTERUS N/A 11/24/2012   Procedure: DILATATION AND CURETTAGE;  Surgeon: Emily Filbert, MD;  Location: [facility];  Service: Gynecology;  Laterality: N/A;  . LAPAROSCOPY N/A 11/24/2012   Procedure: LAPAROSCOPY DIAGNOSTIC;  Surgeon: Emily Filbert, MD;  Location: [facility];  Service: Gynecology;  Laterality: N/A;  . TUBAL LIGATION    . VAGINAL HYSTERECTOMY Bilateral 01/04/2013   Procedure: HYSTERECTOMY VAGINAL with Bilateral Fallopian Tubes;  Surgeon: Emily Filbert, MD;  Location: [facility];  Service: Gynecology;  Laterality: Bilateral;    OB History    Gravida Para Term Preterm AB Living   3 2     1      SAB TAB Ectopic Multiple Live Births   1               Home Medications    Prior to Admission medications   Medication Sig Start Date End Date Taking?  Authorizing Provider  hydrOXYzine (ATARAX/VISTARIL) 50 MG tablet Take 1 tab at bed time Patient taking differently: Take 50 mg by mouth at bedtime. Take 1 tab at bed time 11/14/15  Yes Kathlee Nations, MD  lamoTRIgine (LAMICTAL) 25 MG tablet Take 1 tab daily for 1 week and than 2 tab daily Patient taking differently: Take 50 mg by mouth every evening. Take 1 tab daily for 1 week and than 2 tab daily 11/14/15  Yes Kathlee Nations, MD  sulfamethoxazole-trimethoprim (BACTRIM DS,SEPTRA DS) 800-160 MG tablet Take 1 tablet by mouth every 12 (twelve) hours. 11/27/15  Yes Historical Provider, MD  Vitamin D, Ergocalciferol, (DRISDOL) 50000 UNITS CAPS capsule Take 50,000 Units by mouth every 7 (seven) days.  03/11/13  Yes Historical Provider, MD  LORazepam (ATIVAN) 0.5 MG tablet Take 1 tablet (0.5 mg total) by mouth 2 (two) times daily. Patient not taking: Reported on 12/02/2015 10/22/15 10/21/16  Clarene Reamer, MD  naproxen (NAPROSYN) 500 MG tablet Take 1 tablet (500 mg total) by mouth 2 (two) times daily with a meal. Patient not taking: Reported on 12/02/2015 04/03/15   Larene Pickett, PA-C  ondansetron (ZOFRAN ODT) 4 MG disintegrating tablet Take 1 tablet (4 mg total) by mouth every  8 (eight) hours as needed for nausea or vomiting. 12/02/15   Isla Pence, MD    Family History Family History  Problem Relation Age of Onset  . Depression Mother   . Hypertension Mother   . Bipolar disorder Maternal Aunt   . Diabetes Maternal Aunt   . Cancer Maternal Grandfather     lymphoma  . Hypertension Father   . Diabetes Maternal Uncle     Social History Social History  Substance Use Topics  . Smoking status: Current Every Day Smoker    Packs/day: 0.50    Types: Cigarettes  . Smokeless tobacco: Never Used  . Alcohol use No     Allergies   Patient has no known allergies.   Review of Systems Review of Systems  Gastrointestinal: Positive for nausea and vomiting.  All other systems reviewed and are  negative.    Physical Exam Updated Vital Signs BP 128/90 (BP Location: Left Arm)   Pulse 94   Temp 98.5 F (36.9 C) (Oral)   Resp 18   Ht 5\' 5"  (1.651 m)   Wt 178 lb (80.7 kg)   LMP 12/19/2012   SpO2 100%   BMI 29.62 kg/m   Physical Exam  Constitutional: She is oriented to person, place, and time. She appears well-developed. She appears distressed.  HENT:  Head: Normocephalic and atraumatic.  Right Ear: External ear normal.  Left Ear: External ear normal.  Nose: Nose normal.  Mouth/Throat: Oropharynx is clear and moist.  Eyes: Conjunctivae and EOM are normal. Pupils are equal, round, and reactive to light.  Neck: Normal range of motion. Neck supple.  Cardiovascular: Normal rate, regular rhythm, normal heart sounds and intact distal pulses.   Pulmonary/Chest: Effort normal and breath sounds normal.  Abdominal: Soft. Bowel sounds are normal. There is tenderness in the epigastric area.  Musculoskeletal: Normal range of motion.  Neurological: She is alert and oriented to person, place, and time.  Skin: Skin is warm.  Right axilla abscess nearly healed  Psychiatric: She has a normal mood and affect. Her behavior is normal. Judgment and thought content normal.  Nursing note and vitals reviewed.    ED Treatments / Results  Labs (all labs ordered are listed, but only abnormal results are displayed) Labs Reviewed  COMPREHENSIVE METABOLIC PANEL - Abnormal; Notable for the following:       Result Value   Sodium 134 (*)    Potassium 5.3 (*)    CO2 18 (*)    BUN <5 (*)    Total Bilirubin 2.4 (*)    All other components within normal limits  CBC WITH DIFFERENTIAL/PLATELET - Abnormal; Notable for the following:    WBC 10.9 (*)    All other components within normal limits  URINALYSIS, ROUTINE W REFLEX MICROSCOPIC (NOT AT [facility])  LIPASE, BLOOD    EKG  EKG Interpretation None       Radiology No results found.  Procedures Procedures (including critical care  time)  Medications Ordered in ED Medications  sodium chloride 0.9 % bolus 1,000 mL (1,000 mLs Intravenous New Bag/Given 12/02/15 1021)  ondansetron (ZOFRAN) injection 4 mg (4 mg Intravenous Given 12/02/15 1023)     Initial Impression / Assessment and Plan / ED Course  I have reviewed the triage vital signs and the nursing notes.  Pertinent labs & imaging results that were available during my care of the patient were reviewed by me and considered in my medical decision making (see chart for details).  Clinical Course  Pt feels much better.  She is able to tolerate po fluids.  Since the abscess is better, she was told to stop the bactrim as this is a possible cause of her n/v.  Pt knows to return if worse.  Final Clinical Impressions(s) / ED Diagnoses   Final diagnoses:  Non-intractable vomiting with nausea, unspecified vomiting type  Adverse effect of drug, initial encounter    New Prescriptions New Prescriptions   ONDANSETRON (ZOFRAN ODT) 4 MG DISINTEGRATING TABLET    Take 1 tablet (4 mg total) by mouth every 8 (eight) hours as needed for nausea or vomiting.     Isla Pence, MD 12/02/15 [phone]

## 2015-12-02 NOTE — ED Notes (Signed)
Patient reports relief from nausea

## 2015-12-04 ENCOUNTER — Ambulatory Visit (HOSPITAL_COMMUNITY): Payer: Self-pay | Admitting: Psychiatry

## 2015-12-04 ENCOUNTER — Emergency Department (HOSPITAL_COMMUNITY): Payer: 59

## 2015-12-04 ENCOUNTER — Encounter (HOSPITAL_COMMUNITY): Payer: Self-pay | Admitting: Emergency Medicine

## 2015-12-04 ENCOUNTER — Emergency Department (HOSPITAL_COMMUNITY)
Admission: EM | Admit: 2015-12-04 | Discharge: 2015-12-04 | Disposition: A | Discharge status: Home / Self Care | Payer: 59 | Attending: Physician Assistant | Admitting: Physician Assistant

## 2015-12-04 DIAGNOSIS — K5900 Constipation, unspecified: Secondary | ICD-10-CM | POA: Diagnosis not present

## 2015-12-04 DIAGNOSIS — Z79899 Other long term (current) drug therapy: Secondary | ICD-10-CM | POA: Insufficient documentation

## 2015-12-04 DIAGNOSIS — F17210 Nicotine dependence, cigarettes, uncomplicated: Secondary | ICD-10-CM | POA: Insufficient documentation

## 2015-12-04 DIAGNOSIS — R1032 Left lower quadrant pain: Secondary | ICD-10-CM | POA: Insufficient documentation

## 2015-12-04 DIAGNOSIS — E039 Hypothyroidism, unspecified: Secondary | ICD-10-CM | POA: Insufficient documentation

## 2015-12-04 DIAGNOSIS — I10 Essential (primary) hypertension: Secondary | ICD-10-CM | POA: Diagnosis not present

## 2015-12-04 DIAGNOSIS — R1110 Vomiting, unspecified: Secondary | ICD-10-CM | POA: Diagnosis present

## 2015-12-04 LAB — I-STAT BETA HCG BLOOD, ED (MC, WL, AP ONLY)

## 2015-12-04 LAB — CBC WITH DIFFERENTIAL/PLATELET
BASOS ABS: 0 10*3/uL (ref 0.0–0.1)
BASOS PCT: 0 %
EOS ABS: 0.1 10*3/uL (ref 0.0–0.7)
EOS PCT: 1 %
HCT: 40.1 % (ref 36.0–46.0)
HEMOGLOBIN: 13.8 g/dL (ref 12.0–15.0)
LYMPHS ABS: 2.4 10*3/uL (ref 0.7–4.0)
Lymphocytes Relative: 26 %
MCH: 31.7 pg (ref 26.0–34.0)
MCHC: 34.4 g/dL (ref 30.0–36.0)
MCV: 92 fL (ref 78.0–100.0)
Monocytes Absolute: 0.6 10*3/uL (ref 0.1–1.0)
Monocytes Relative: 7 %
NEUTROS PCT: 66 %
Neutro Abs: 6 10*3/uL (ref 1.7–7.7)
PLATELETS: 296 10*3/uL (ref 150–400)
RBC: 4.36 MIL/uL (ref 3.87–5.11)
RDW: 14.7 % (ref 11.5–15.5)
WBC: 9.1 10*3/uL (ref 4.0–10.5)

## 2015-12-04 LAB — COMPREHENSIVE METABOLIC PANEL
ALT: 13 U/L — AB (ref 14–54)
AST: 20 U/L (ref 15–41)
Albumin: 3.5 g/dL (ref 3.5–5.0)
Alkaline Phosphatase: 97 U/L (ref 38–126)
Anion gap: 7 (ref 5–15)
BILIRUBIN TOTAL: 1.6 mg/dL — AB (ref 0.3–1.2)
CALCIUM: 9.2 mg/dL (ref 8.9–10.3)
CHLORIDE: 107 mmol/L (ref 101–111)
CO2: 22 mmol/L (ref 22–32)
CREATININE: 0.78 mg/dL (ref 0.44–1.00)
Glucose, Bld: 89 mg/dL (ref 65–99)
Potassium: 3.8 mmol/L (ref 3.5–5.1)
Sodium: 136 mmol/L (ref 135–145)
TOTAL PROTEIN: 7.3 g/dL (ref 6.5–8.1)

## 2015-12-04 LAB — URINALYSIS, ROUTINE W REFLEX MICROSCOPIC
BILIRUBIN URINE: NEGATIVE
Glucose, UA: NEGATIVE mg/dL
HGB URINE DIPSTICK: NEGATIVE
KETONES UR: 15 mg/dL — AB
Leukocytes, UA: NEGATIVE
NITRITE: NEGATIVE
Protein, ur: NEGATIVE mg/dL
SPECIFIC GRAVITY, URINE: 1.01 (ref 1.005–1.030)
pH: 6 (ref 5.0–8.0)

## 2015-12-04 LAB — LIPASE, BLOOD: LIPASE: 25 U/L (ref 11–51)

## 2015-12-04 MED ORDER — DOCUSATE SODIUM 100 MG PO CAPS: 100.0000 mg | ORAL_CAPSULE | Freq: Two times a day (BID) | ORAL | 0 refills | Status: DC

## 2015-12-04 MED ORDER — POLYETHYLENE GLYCOL 3350 17 G PO PACK: 17.0000 g | PACK | Freq: Every day | ORAL | 0 refills | Status: DC

## 2015-12-04 MED ORDER — IOPAMIDOL (ISOVUE-300) INJECTION 61%
INTRAVENOUS | Status: AC
  Administered 2015-12-04: 100 mL
  Filled 2015-12-04: qty 100

## 2015-12-04 MED ORDER — ONDANSETRON HCL 4 MG/2ML IJ SOLN
4.0000 mg | Freq: Once | INTRAMUSCULAR | Status: AC
  Administered 2015-12-04: 4 mg via INTRAVENOUS
  Filled 2015-12-04: qty 2

## 2015-12-04 MED ORDER — SODIUM CHLORIDE 0.9 % IV BOLUS (SEPSIS)
1000.0000 mL | Freq: Once | INTRAVENOUS | Status: AC
  Administered 2015-12-04: 1000 mL via INTRAVENOUS

## 2015-12-04 NOTE — ED Triage Notes (Signed)
Pt here for vomiting x 1 week

## 2015-12-04 NOTE — Discharge Instructions (Signed)
You were seen today for symptoms of vomiting and constipation. Your CAT scan was normal. Your labs appear normal. Your vital signs are normal. We think that some of your nausea and vomiting may be due to constipation. We have prescribed U MiraLAX. Please take packet of MiraLAX every couple hours until you are able to stool. Please return with any concerns. Your labs show normal renal function

## 2015-12-04 NOTE — ED Notes (Signed)
Patient transported to CT 

## 2015-12-04 NOTE — ED Provider Notes (Signed)
[facility] DEPT Provider Note   CSN: [id number] Arrival date & time: 12/04/15  0859     History   Chief Complaint Chief Complaint  Patient presents with  . Emesis    HPI Diana Francis is a 35 y.o. female.  The history is provided by the patient.  Emesis   This is a recurrent problem. The current episode started more than 2 days ago. The problem occurs 2 to 4 times per day. The problem has not changed since onset.The emesis has an appearance of stomach contents. There has been no fever. Associated symptoms include abdominal pain. Pertinent negatives include no chills, no diarrhea, no fever, no myalgias and no URI.  Patient with recent visit to the emergency department treated with Zofran and fluids, continued to have issues at home.  Past Medical History:  Diagnosis Date  . Anemia    history of anemia  . Depression    Bipolar  . GERD (gastroesophageal reflux disease)   . Headache(784.0)   . HTN (hypertension)    no meds- per pt white coat syndrome  . Hypothyroidism   . Thyroid disease     Patient Active Problem List   Diagnosis Date Noted  . Depression, major, recurrent, moderate ([place]) 10/03/2015    Class: Chronic  . Depression 12/13/2012  . PTSD (post-traumatic stress disorder) 09/27/2012    Past Surgical History:  Procedure Laterality Date  . DILATION AND CURETTAGE OF UTERUS N/A 11/24/2012   Procedure: DILATATION AND CURETTAGE;  Surgeon: Emily Filbert, MD;  Location: [facility];  Service: Gynecology;  Laterality: N/A;  . LAPAROSCOPY N/A 11/24/2012   Procedure: LAPAROSCOPY DIAGNOSTIC;  Surgeon: Emily Filbert, MD;  Location: [facility];  Service: Gynecology;  Laterality: N/A;  . TUBAL LIGATION    . VAGINAL HYSTERECTOMY Bilateral 01/04/2013   Procedure: HYSTERECTOMY VAGINAL with Bilateral Fallopian Tubes;  Surgeon: Emily Filbert, MD;  Location: [facility];  Service: Gynecology;  Laterality: Bilateral;    OB History    Gravida Para Term Preterm AB Living   3 2     1      SAB TAB Ectopic Multiple Live Births   1               Home Medications    Prior to Admission medications   Medication Sig Start Date End Date Taking? Authorizing Provider  hydrOXYzine (ATARAX/VISTARIL) 50 MG tablet Take 1 tab at bed time Patient taking differently: Take 50 mg by mouth at bedtime. Take 1 tab at bed time 11/14/15  Yes Kathlee Nations, MD  lamoTRIgine (LAMICTAL) 25 MG tablet Take 1 tab daily for 1 week and than 2 tab daily Patient taking differently: Take 50 mg by mouth every evening.  11/14/15  Yes Kathlee Nations, MD  lamoTRIgine (LAMICTAL) 25 MG tablet Take 50 mg by mouth daily.   Yes Historical Provider, MD  naproxen (NAPROSYN) 500 MG tablet Take 1 tablet (500 mg total) by mouth 2 (two) times daily with a meal. 04/03/15  Yes Larene Pickett, PA-C  ondansetron (ZOFRAN ODT) 4 MG disintegrating tablet Take 1 tablet (4 mg total) by mouth every 8 (eight) hours as needed for nausea or vomiting. 12/02/15  Yes Isla Pence, MD  Vitamin D, Ergocalciferol, (DRISDOL) 50000 UNITS CAPS capsule Take 50,000 Units by mouth every 7 (seven) days. Patient takes on Tuesday of each week 03/11/13  Yes Historical Provider, MD  docusate sodium (COLACE) 100 MG capsule Take 1 capsule (100 mg total) by mouth  every 12 (twelve) hours. 12/04/15   Zaina Jenkin Lyn Emmitt Matthews, MD  LORazepam (ATIVAN) 0.5 MG tablet Take 1 tablet (0.5 mg total) by mouth 2 (two) times daily. Patient not taking: Reported on 12/04/2015 10/22/15 10/21/16  Clarene Reamer, MD  polyethylene glycol [facility]) packet Take 17 g by mouth daily. 12/04/15   Wanona Stare [place], MD  sulfamethoxazole-trimethoprim (BACTRIM DS,SEPTRA DS) 800-160 MG tablet Take 1 tablet by mouth every 12 (twelve) hours. 11/27/15   Historical Provider, MD    Family History Family History  Problem Relation Age of Onset  . Depression Mother   . Hypertension Mother   . Bipolar disorder Maternal Aunt   . Diabetes Maternal Aunt   . Cancer Maternal Grandfather     lymphoma    . Hypertension Father   . Diabetes Maternal Uncle     Social History Social History  Substance Use Topics  . Smoking status: Current Every Day Smoker    Packs/day: 0.50    Types: Cigarettes  . Smokeless tobacco: Never Used  . Alcohol use No     Allergies   Bactrim [sulfamethoxazole-trimethoprim]   Review of Systems Review of Systems  Constitutional: Negative for chills and fever.  Gastrointestinal: Positive for abdominal pain, constipation and vomiting. Negative for diarrhea.  Musculoskeletal: Negative for myalgias.  All other systems reviewed and are negative.    Physical Exam Updated Vital Signs BP 127/90 (BP Location: Right Arm)   Pulse 80   Temp 98 F (36.7 C) (Oral)   Resp 16   LMP 12/19/2012   SpO2 100%   Physical Exam  Constitutional: She is oriented to person, place, and time. She appears well-developed and well-nourished.  HENT:  Head: Normocephalic and atraumatic.  Eyes: Right eye exhibits no discharge.  Cardiovascular: Normal rate, regular rhythm and normal heart sounds.   No murmur heard. Pulmonary/Chest: Effort normal and breath sounds normal. She has no wheezes. She has no rales.  Abdominal: Soft. She exhibits no distension. There is tenderness.  Mild fullness, tenderness to left lower quadrant.  Neurological: She is oriented to person, place, and time.  Skin: Skin is warm and dry. She is not diaphoretic.  Psychiatric: She has a normal mood and affect.  Nursing note and vitals reviewed.    ED Treatments / Results  Labs (all labs ordered are listed, but only abnormal results are displayed) Labs Reviewed  COMPREHENSIVE METABOLIC PANEL - Abnormal; Notable for the following:       Result Value   BUN <5 (*)    ALT 13 (*)    Total Bilirubin 1.6 (*)    All other components within normal limits  URINALYSIS, ROUTINE W REFLEX MICROSCOPIC (NOT AT [facility]) - Abnormal; Notable for the following:    Ketones, ur 15 (*)    All other components within  normal limits  CBC WITH DIFFERENTIAL/PLATELET  LIPASE, BLOOD  I-STAT BETA HCG BLOOD, ED ([facility], [facility], [facility] ONLY)    EKG  EKG Interpretation None       Radiology Ct Abdomen Pelvis W Contrast  Result Date: 12/04/2015 CLINICAL DATA:  Left lower quadrant pain and vomiting for 1 week. EXAM: CT ABDOMEN AND PELVIS WITH CONTRAST TECHNIQUE: Multidetector CT imaging of the abdomen and pelvis was performed using the standard protocol following bolus administration of intravenous contrast. CONTRAST:  175mL ISOVUE-300 IOPAMIDOL (ISOVUE-300) INJECTION 61% COMPARISON:  Pelvic ultrasound 11/04/2012 FINDINGS: Lower chest: No acute abnormality. Hepatobiliary: No focal liver abnormality is seen. No gallstones, gallbladder wall thickening, or biliary dilatation.  Pancreas: Unremarkable. No pancreatic ductal dilatation or surrounding inflammatory changes. Spleen: Normal in size without focal abnormality. Adrenals/Urinary Tract: No adrenal hemorrhage or renal injury identified. The urinary bladder demonstrates diffuse wall thickening. Stomach/Bowel: Stomach is within normal limits. No evidence of bowel distention, or inflammatory changes. No evidence of appendicitis. Mild diffuse thickening of the rectum is seen. Vascular/Lymphatic: No significant vascular findings are present. No enlarged abdominal or pelvic lymph nodes. Reproductive: Status post hysterectomy. No adnexal masses. Bilateral adnexal cysts are likely physiologic. Other: No abdominal wall hernia or abnormality. No abdominopelvic ascites. Musculoskeletal: No acute or significant osseous findings. IMPRESSION: No acute abnormalities within the abdomen or pelvis. Status post hysterectomy. Bilateral adnexal cysts measuring up to 3 cm, likely physiologic. Diffuse mild urinary bladder wall thickening, which may be due to its partially decompressed state. Mild diffuse thickening of the rectum with uncertain significance. Please correlate clinically. Electronically Signed    By: Fidela Salisbury M.D.   On: 12/04/2015 11:43    Procedures Procedures (including critical care time)  Medications Ordered in ED Medications  sodium chloride 0.9 % bolus 1,000 mL (0 mLs Intravenous Stopped 12/04/15 1142)  ondansetron (ZOFRAN) injection 4 mg (4 mg Intravenous Given 12/04/15 1007)  iopamidol (ISOVUE-300) 61 % injection (100 mLs  Contrast Given 12/04/15 1118)     Initial Impression / Assessment and Plan / ED Course  I have reviewed the triage vital signs and the nursing notes.  Pertinent labs & imaging results that were available during my care of the patient were reviewed by me and considered in my medical decision making (see chart for details).  Clinical Course     Patient is a pleasant 35 year old female presenting with 1 week of increasing nausea vomiting. Patient was seen here in the emergency department and continued to have issues at home.Patient reports taking her Zofran but still feels nauseated every time she eats. Patient reports also a 9 pound weight loss in the last week. Patient was given Vicodin at recent visit for abscess and I think this is causede her to be constipated. Patient reports she has not stooled in the last week. I think that this is likely constipated and induced nausea. However given the weight loss and tenderness left lower quadrant with CAT scan to rule out any kind of intra-abdominal pathology. If not we discussed effective regimen for relieving her constipation.  Ct negative. Pt taking PO. Will have her use over the counter ant acids, Will treat for constipation, follow up with PCP. (for further evaluation with GI if not improved with constipation relief)   Final Clinical Impressions(s) / ED Diagnoses   Final diagnoses:  Constipation, unspecified constipation type    New Prescriptions Discharge Medication List as of 12/04/2015  1:18 PM    START taking these medications   Details  docusate sodium (COLACE) 100 MG capsule Take 1  capsule (100 mg total) by mouth every 12 (twelve) hours., Starting Wed 12/04/2015, Print    polyethylene glycol (MIRALAX) packet Take 17 g by mouth daily., Starting Wed 12/04/2015, Print         Shamarie Call Julio Alm, MD 12/04/15 [phone]

## 2015-12-10 ENCOUNTER — Ambulatory Visit (INDEPENDENT_AMBULATORY_CARE_PROVIDER_SITE_OTHER): Payer: 59 | Admitting: Psychiatry

## 2015-12-10 DIAGNOSIS — F411 Generalized anxiety disorder: Secondary | ICD-10-CM

## 2015-12-12 NOTE — Progress Notes (Signed)
   THERAPIST PROGRESS NOTE  Participation Level:Active  Behavioral Response:CasualAlerteuthymic  Type of Therapy: Individual Therapy   Treatment Goals addressed: emotion regulation, stress management, coping skills   Interventions:CBT, supportive, strength-based   Summary: Diana Francis is a 35 y.o. female who presents with depression and anxiety.   Suicidal/Homicidal:nowithout intent/plan   Therapist Response: Pt. Presents for first session since discharge from [facility]. Pt. Reports that she has not yet returned to work, but plans to return after the Thanksgiving Holiday. Pt. Reports that she met with psychiatrist and has decided not to take lamictal because she does not believe that she has bipolar disorder and does better when she focuses on situational causes of her depression and her lifestyle. Pt. Reports that she successfully removed her brother, sister-in-law, and their children from her home and is under significantly less stress. Pt. Reports that she has set healthy boundaries with her deceased father's children and they no longer are harassing her. Pt. Reports that she is developing healthy relationships with her father's family and tentatively planning to spend time with them over the Thanksgiving holiday. Pt. Reports that she was recently diagnosed with diverticulitis and is learning the impact that stress and lifestyle have on her body and is prioritizing her self-care. Counselor reviewed grounding techniques for self-care and stress management.   Plan: Pt. To come for IOP orientation tomorrow.   Diagnosis: Axis I:Depressive Disorder NOS   Axis JK:DTOIZTIW     Nancie Neas, [facility] 12/12/2015

## 2015-12-17 ENCOUNTER — Ambulatory Visit (HOSPITAL_COMMUNITY): Payer: Self-pay | Admitting: Psychiatry

## 2015-12-18 ENCOUNTER — Ambulatory Visit (INDEPENDENT_AMBULATORY_CARE_PROVIDER_SITE_OTHER): Payer: 59 | Admitting: Psychiatry

## 2015-12-18 ENCOUNTER — Encounter (HOSPITAL_COMMUNITY): Payer: Self-pay

## 2015-12-18 ENCOUNTER — Encounter (HOSPITAL_COMMUNITY): Payer: Self-pay | Admitting: Psychiatry

## 2015-12-18 DIAGNOSIS — Z79899 Other long term (current) drug therapy: Secondary | ICD-10-CM | POA: Diagnosis not present

## 2015-12-18 DIAGNOSIS — F321 Major depressive disorder, single episode, moderate: Secondary | ICD-10-CM

## 2015-12-18 DIAGNOSIS — F411 Generalized anxiety disorder: Secondary | ICD-10-CM

## 2015-12-18 MED ORDER — HYDROXYZINE HCL 50 MG PO TABS: ORAL_TABLET | ORAL | 1 refills | Status: DC

## 2015-12-18 MED ORDER — LAMOTRIGINE 25 MG PO TABS: 50.0000 mg | ORAL_TABLET | Freq: Every day | ORAL | 1 refills | Status: DC

## 2015-12-18 NOTE — Progress Notes (Signed)
[facility] [phone] Progress Note  KAMI KUBE [id number] 35 y.o.  12/18/2015 10:09 AM  Chief Complaint:  I stop taking medication for 1 week because of severe nausea.  I started taking medication a few days ago.             History of Present Illness. Diana Francis came for her followup appointment.  She reported that she needed to stop taking Lamictal and Vistaril because of persistent nausea.  Initially she went to the emergency room and then later seen GI who believes she may have diverticulitis but no further medication or past or order.  She is feeling better.  She is still have occasional nausea but overall no other physical symptoms.  She admitted feeling sad and depressed because of the holidays.  Her father died in Sep 20, 2022.  She's not sure if she want to spend Thanksgiving with her mother or with her uncle.  However she is more optimistic.  She is seeing [name] for counseling.  She denies any recent crying spells or any feeling of hopelessness.  She sleeping better with the Vistaril.  She denies any paranoia or any hallucination.  She has no rash, itching or any headaches with Lamictal.  She's out of work since September but will resume her work starting next Monday.  She works at [employer].  She admitted job can be stressful some time but she need to work due to financial reasons.  Patient denies drinking alcohol or using any illegal substances.  Her vital signs are stable.  Suicidal Ideation: No Plan Formed: No Patient has means to carry out plan: No  Homicidal Ideation: No Plan Formed: No Patient has means to carry out plan: No  Review of Systems  Constitutional: Negative.   Respiratory: Negative.   Cardiovascular: Negative.   Gastrointestinal: Positive for nausea.  Musculoskeletal: Negative.   Skin: Negative.   Neurological: Negative.   Psychiatric/Behavioral: Negative for hallucinations.    Psychiatric: Agitation: No Hallucination:  No Depressed Mood: No Insomnia: No Hypersomnia: No Altered Concentration: No Feels Worthless: No Grandiose Ideas: No Belief In Special Powers: No New/Increased Substance Abuse: No Compulsions: No  Neurologic: Headache: Yes Seizure: No Paresthesias: No  Medical History;  Patient has hypertension, GERD and hypothyroidism.  She has hysterectomy .  Her primary care physician is Dr. Altha Harm at [facility].  She denies any history of traumatic brain injury, seizures or any concussion.   Outpatient Encounter Prescriptions as of 12/18/2015  Medication Sig Dispense Refill  . docusate sodium (COLACE) 100 MG capsule Take 1 capsule (100 mg total) by mouth every 12 (twelve) hours. 60 capsule 0  . hydrOXYzine (ATARAX/VISTARIL) 50 MG tablet Take 1 tab at bed time 30 tablet 1  . lamoTRIgine (LAMICTAL) 25 MG tablet Take 2 tablets (50 mg total) by mouth daily. 60 tablet 1  . polyethylene glycol (MIRALAX) packet Take 17 g by mouth daily. 14 each 0  . Vitamin D, Ergocalciferol, (DRISDOL) 50000 UNITS CAPS capsule Take 50,000 Units by mouth every 7 (seven) days. Patient takes on Tuesday of each week    . [DISCONTINUED] hydrOXYzine (ATARAX/VISTARIL) 50 MG tablet Take 1 tab at bed time (Patient taking differently: Take 50 mg by mouth at bedtime. Take 1 tab at bed time) 30 tablet 1  . [DISCONTINUED] lamoTRIgine (LAMICTAL) 25 MG tablet Take 1 tab daily for 1 week and than 2 tab daily (Patient taking differently: Take 50 mg by mouth every evening. ) 60 tablet 1  . [  DISCONTINUED] lamoTRIgine (LAMICTAL) 25 MG tablet Take 50 mg by mouth daily.    . [DISCONTINUED] LORazepam (ATIVAN) 0.5 MG tablet Take 1 tablet (0.5 mg total) by mouth 2 (two) times daily. (Patient not taking: Reported on 12/04/2015) 60 tablet 0  . [DISCONTINUED] naproxen (NAPROSYN) 500 MG tablet Take 1 tablet (500 mg total) by mouth 2 (two) times daily with a meal. 30 tablet 0  . [DISCONTINUED] ondansetron (ZOFRAN ODT) 4 MG disintegrating  tablet Take 1 tablet (4 mg total) by mouth every 8 (eight) hours as needed for nausea or vomiting. 20 tablet 0  . [DISCONTINUED] sulfamethoxazole-trimethoprim (BACTRIM DS,SEPTRA DS) 800-160 MG tablet Take 1 tablet by mouth every 12 (twelve) hours.     No facility-administered encounter medications on file as of 12/18/2015.     Past Psychiatric History/Hospitalization(s) Patient has done twice intensive outpatient program .  Patient denies any history of psychiatric inpatient treatment or any suicidal attempt however endorse history of mood swings irritability and anger.  She denies any psychosis or any hallucination.  She was referred to [place] from primary care physician who started her on Prozac but she stopped because of significant nausea and vomiting.  In the past she had tried Zoloft, Remeron with limited response.   Anxiety: Yes Bipolar Disorder: No Depression: Yes Mania: No Psychosis: No Schizophrenia: No Personality Disorder: No Hospitalization for psychiatric illness: No History of Electroconvulsive Shock Therapy: No Prior Suicide Attempts: No  Physical Exam: Constitutional:  BP 118/72   Pulse 75   Ht 5' 5"  (1.651 m)   Wt 177 lb (80.3 kg)   LMP 12/19/2012   BMI 29.45 kg/m   Recent Results (from the past 2160 hour(s))  Comprehensive metabolic panel     Status: Abnormal   Collection Time: 12/02/15 10:20 AM  Result Value Ref Range   Sodium 134 (L) 135 - 145 mmol/L   Potassium 5.3 (H) 3.5 - 5.1 mmol/L   Chloride 105 101 - 111 mmol/L   CO2 18 (L) 22 - 32 mmol/L   Glucose, Bld 96 65 - 99 mg/dL   BUN <5 (L) 6 - 20 mg/dL   Creatinine, Ser 0.84 0.44 - 1.00 mg/dL   Calcium 9.2 8.9 - 10.3 mg/dL   Total Protein 7.5 6.5 - 8.1 g/dL   Albumin 3.6 3.5 - 5.0 g/dL   AST 40 15 - 41 U/L   ALT 21 14 - 54 U/L   Alkaline Phosphatase 119 38 - 126 U/L   Total Bilirubin 2.4 (H) 0.3 - 1.2 mg/dL   GFR calc non Af Amer >60 >60 mL/min   GFR calc Af Amer >60 >60 mL/min    Comment: (NOTE) The  eGFR has been calculated using the CKD EPI equation. This calculation has not been validated in all clinical situations. eGFR's persistently <60 mL/min signify possible Chronic Kidney Disease.    Anion gap 11 5 - 15  CBC with Differential     Status: Abnormal   Collection Time: 12/02/15 10:20 AM  Result Value Ref Range   WBC 10.9 (H) 4.0 - 10.5 K/uL   RBC 4.57 3.87 - 5.11 MIL/uL   Hemoglobin 14.1 12.0 - 15.0 g/dL   HCT 41.6 36.0 - 46.0 %   MCV 91.0 78.0 - 100.0 fL   MCH 30.9 26.0 - 34.0 pg   MCHC 33.9 30.0 - 36.0 g/dL   RDW 14.7 11.5 - 15.5 %   Platelets 348 150 - 400 K/uL   Neutrophils Relative % 62 %  Neutro Abs 6.7 1.7 - 7.7 K/uL   Lymphocytes Relative 30 %   Lymphs Abs 3.2 0.7 - 4.0 K/uL   Monocytes Relative 7 %   Monocytes Absolute 0.8 0.1 - 1.0 K/uL   Eosinophils Relative 1 %   Eosinophils Absolute 0.1 0.0 - 0.7 K/uL   Basophils Relative 0 %   Basophils Absolute 0.0 0.0 - 0.1 K/uL  Lipase, blood     Status: None   Collection Time: 12/02/15 10:20 AM  Result Value Ref Range   Lipase 25 11 - 51 U/L  Urinalysis, Routine w reflex microscopic     Status: None   Collection Time: 12/02/15 10:51 AM  Result Value Ref Range   Color, Urine YELLOW YELLOW   APPearance CLEAR CLEAR   Specific Gravity, Urine 1.018 1.005 - 1.030   pH 6.0 5.0 - 8.0   Glucose, UA NEGATIVE NEGATIVE mg/dL   Hgb urine dipstick NEGATIVE NEGATIVE   Bilirubin Urine NEGATIVE NEGATIVE   Ketones, ur NEGATIVE NEGATIVE mg/dL   Protein, ur NEGATIVE NEGATIVE mg/dL   Nitrite NEGATIVE NEGATIVE   Leukocytes, UA NEGATIVE NEGATIVE    Comment: MICROSCOPIC NOT DONE ON URINES WITH NEGATIVE PROTEIN, BLOOD, LEUKOCYTES, NITRITE, OR GLUCOSE <1000 mg/dL.  Comprehensive metabolic panel     Status: Abnormal   Collection Time: 12/04/15  9:52 AM  Result Value Ref Range   Sodium 136 135 - 145 mmol/L   Potassium 3.8 3.5 - 5.1 mmol/L   Chloride 107 101 - 111 mmol/L   CO2 22 22 - 32 mmol/L   Glucose, Bld 89 65 - 99 mg/dL    BUN <5 (L) 6 - 20 mg/dL   Creatinine, Ser 0.78 0.44 - 1.00 mg/dL   Calcium 9.2 8.9 - 10.3 mg/dL   Total Protein 7.3 6.5 - 8.1 g/dL   Albumin 3.5 3.5 - 5.0 g/dL   AST 20 15 - 41 U/L   ALT 13 (L) 14 - 54 U/L   Alkaline Phosphatase 97 38 - 126 U/L   Total Bilirubin 1.6 (H) 0.3 - 1.2 mg/dL   GFR calc non Af Amer >60 >60 mL/min   GFR calc Af Amer >60 >60 mL/min    Comment: (NOTE) The eGFR has been calculated using the CKD EPI equation. This calculation has not been validated in all clinical situations. eGFR's persistently <60 mL/min signify possible Chronic Kidney Disease.    Anion gap 7 5 - 15  CBC with Differential/Platelet     Status: None   Collection Time: 12/04/15  9:52 AM  Result Value Ref Range   WBC 9.1 4.0 - 10.5 K/uL   RBC 4.36 3.87 - 5.11 MIL/uL   Hemoglobin 13.8 12.0 - 15.0 g/dL   HCT 40.1 36.0 - 46.0 %   MCV 92.0 78.0 - 100.0 fL   MCH 31.7 26.0 - 34.0 pg   MCHC 34.4 30.0 - 36.0 g/dL   RDW 14.7 11.5 - 15.5 %   Platelets 296 150 - 400 K/uL   Neutrophils Relative % 66 %   Neutro Abs 6.0 1.7 - 7.7 K/uL   Lymphocytes Relative 26 %   Lymphs Abs 2.4 0.7 - 4.0 K/uL   Monocytes Relative 7 %   Monocytes Absolute 0.6 0.1 - 1.0 K/uL   Eosinophils Relative 1 %   Eosinophils Absolute 0.1 0.0 - 0.7 K/uL   Basophils Relative 0 %   Basophils Absolute 0.0 0.0 - 0.1 K/uL  Lipase, blood     Status: None   Collection  Time: 12/04/15  9:52 AM  Result Value Ref Range   Lipase 25 11 - 51 U/L  I-Stat Beta hCG blood, ED ([facility], [facility], [facility] only)     Status: None   Collection Time: 12/04/15 10:14 AM  Result Value Ref Range   I-stat hCG, quantitative <5.0 <5 mIU/mL   Comment 3            Comment:   GEST. AGE      CONC.  (mIU/mL)   <=1 WEEK        5 - 50     2 WEEKS       50 - 500     3 WEEKS       100 - 10,000     4 WEEKS     1,000 - 30,000        FEMALE AND NON-PREGNANT FEMALE:     LESS THAN 5 mIU/mL   Urinalysis, Routine w reflex microscopic (not at [facility])     Status: Abnormal    Collection Time: 12/04/15 11:58 AM  Result Value Ref Range   Color, Urine YELLOW YELLOW   APPearance CLEAR CLEAR   Specific Gravity, Urine 1.010 1.005 - 1.030    Comment: REPEATED TO VERIFY   pH 6.0 5.0 - 8.0   Glucose, UA NEGATIVE NEGATIVE mg/dL   Hgb urine dipstick NEGATIVE NEGATIVE   Bilirubin Urine NEGATIVE NEGATIVE   Ketones, ur 15 (A) NEGATIVE mg/dL   Protein, ur NEGATIVE NEGATIVE mg/dL   Nitrite NEGATIVE NEGATIVE   Leukocytes, UA NEGATIVE NEGATIVE    Comment: MICROSCOPIC NOT DONE ON URINES WITH NEGATIVE PROTEIN, BLOOD, LEUKOCYTES, NITRITE, OR GLUCOSE <1000 mg/dL.   Musculoskeletal: Strength & Muscle Tone: within normal limits Gait & Station: normal Patient leans: N/A  Mental Status Examination;  Patient is well dressed and groomed.  She appears to be in her stated age but short stature.  She described her mood euthymic and her affect is constricted.  She denies any auditory or visual hallucination.  She has any active or passive suicidal thoughts or homicidal thought.  Her attention and concentration is okay.  There were no flight of ideas or any loose association.  There were no paranoia, delusion or any obsessive thoughts.  Her cognition is good.  Her fund of knowledge is adequate.  She is alert and oriented 3.  Her insight judgment and impulse control is okay.   Established Problem, Stable/Improving (1), Review or order clinical lab tests (1), Review and summation of old records (2), Review of Last Therapy Session (1) and Review of Medication Regimen & Side Effects (2)  Assessment: Axis I: Anxiety disorder NOS, major depressive disorder, recurrent.   Axis II: Deferred  Axis III:  Past Medical History:  Diagnosis Date  . Anemia    history of anemia  . Depression    Bipolar  . GERD (gastroesophageal reflux disease)   . Headache(784.0)   . HTN (hypertension)    no meds- per pt white coat syndrome  . Hypothyroidism   . Thyroid disease     Plan:  Patient resume  her psychiatric medication after she stopped briefly for nausea.  She is hoping that Lamictal will help her depression.  She also like to resume counseling with [name] and her work starting next Monday.  She has no concern.  I will continue Lamictal 50 mg daily and Vistaril 25 mg as needed for insomnia.  I reviewed blood work and records from emergency room.  Follow-up  in 3 months.Discussed medication side effects and benefits.  Recommended to call us back if there is any question, concern or worsening of the symptoms.  Discuss safety plan that anytime having active suicidal thoughts or homicidal thoughts and she need to call 911 or go to the local emergency room.  Jemar Paulsen T., MD 12/18/2015                      Patient ID: Kerman Passey, female   DOB: 1980/03/30, 35 y.o.   MRN: [id number]

## 2015-12-24 ENCOUNTER — Ambulatory Visit (HOSPITAL_COMMUNITY): Payer: Self-pay | Admitting: Psychiatry

## 2016-01-14 ENCOUNTER — Ambulatory Visit (INDEPENDENT_AMBULATORY_CARE_PROVIDER_SITE_OTHER): Payer: 59 | Admitting: Psychiatry

## 2016-01-14 DIAGNOSIS — F321 Major depressive disorder, single episode, moderate: Secondary | ICD-10-CM | POA: Diagnosis not present

## 2016-01-16 NOTE — Progress Notes (Signed)
   THERAPIST PROGRESS NOTE  Time: 3:10-4:00  Participation Level:Active  Behavioral Response:CasualAlerteuthymic  Type of Therapy: Individual Therapy   Treatment Goals addressed: emotion regulation, stress management, coping skills   Interventions:CBT, supportive, strength-based   Summary: Diana Francis is a 35 y.o. female who presents with depression and anxiety.   Suicidal/Homicidal:nowithout intent/plan   Therapist Response: Pt.'s second session session since discharge from [facility]. Pt. Continues to present with euthymic mood, talkative, engaged in therapeutic process. Pt. Reports that she has made progress in her grief of her father and keeping rigid boundaries with family members or where self-serving and manipulative. Pt. Reports that she has successfully returned to work, that she has continued to make progress in forging relationships with her deceased father's family, and she has continued to establish healthy boundaries with her brother, sister-in-law, mother, and her children. Pt. Discussed her decision to not purchase Christmas presents this year in order to develop empathy and gratitude in her children. Pt. Discussed that she had made peace with the children and recognized this as progress for her and correcting a pattern of overindulging her children. Significant time spent processing recent problems with her teenage son and exploring school options for him. Pt. Also discussed beginning of new romantic relationship and importance of having vulnerability in the relationship in establishing trust and intimacy.   Plan: Pt. To come for IOP orientation tomorrow.   Diagnosis: Axis I:Depressive Disorder NOS   Axis [id number]    Nancie Neas, [facility] 01/16/2016

## 2016-02-11 ENCOUNTER — Ambulatory Visit (INDEPENDENT_AMBULATORY_CARE_PROVIDER_SITE_OTHER): Payer: 59 | Admitting: Psychiatry

## 2016-02-11 DIAGNOSIS — F320 Major depressive disorder, single episode, mild: Secondary | ICD-10-CM
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
## 2016-03-17 ENCOUNTER — Ambulatory Visit (HOSPITAL_COMMUNITY): Payer: 59 | Admitting: Psychiatry

## 2016-03-17 ENCOUNTER — Ambulatory Visit (HOSPITAL_COMMUNITY): Payer: Self-pay | Admitting: Psychiatry

## 2016-03-26 ENCOUNTER — Ambulatory Visit (HOSPITAL_COMMUNITY): Payer: Self-pay | Admitting: Psychiatry

## 2016-04-02 ENCOUNTER — Ambulatory Visit (HOSPITAL_COMMUNITY): Payer: 59 | Admitting: Psychiatry

## 2016-05-05 ENCOUNTER — Ambulatory Visit (INDEPENDENT_AMBULATORY_CARE_PROVIDER_SITE_OTHER): Payer: 59 | Admitting: Psychiatry

## 2016-05-05 DIAGNOSIS — F320 Major depressive disorder, single episode, mild: Secondary | ICD-10-CM | POA: Diagnosis not present

## 2016-05-06 NOTE — Progress Notes (Signed)
   THERAPIST PROGRESS NOTE   Time: 3:05-4:00  Participation Level:Active  Behavioral Response:CasualAlertEuthymic  Type of Therapy: Individual Therapy   Treatment Goals addressed: emotion regulation, stress management, coping skills   Interventions:CBT, supportive, strength-based   Summary: Diana Francis is a 36y.o. female who presents with depression and anxiety.   Suicidal/Homicidal:nowithout intent/plan   Therapist Response: Pt. Presented as talkative, stressed, engaged in the therapeutic process. Pt. Reported that her stress level is significantly lower since last session. Pt. Reports that her son was able to enroll in another high school, but continues involvement with girlfriend who is a negative influence on his behavior. Pt. Discussed that she is managing her stress level by continuing to have healthy boundaries with her son by supporting his healthy behavior, but withdrawing support for behaviors that she perceives as unhealthy. Pt. Discussed that she has been spending quality time with there daughter because she feels that her daughter sacrificed the most when she was having to manage her son's behavioral problems. Significant part of session was focused on discussion of recent relationships and pattern of making premature judgements about people before trust is built. Pt. Discussed at this time she is focusing on herself and developing her awareness of what peace and contentment look and feel like in her life.  Plan: Pt. To return for follow-up in 4 weeks.  Diagnosis: Axis I:Depressive Disorder NOS   Axis BX:[id number]   Nancie Neas, [facility] 05/06/2016

## 2016-08-04 ENCOUNTER — Ambulatory Visit (HOSPITAL_COMMUNITY): Payer: Self-pay | Admitting: Psychiatry

## 2016-08-26 ENCOUNTER — Ambulatory Visit (INDEPENDENT_AMBULATORY_CARE_PROVIDER_SITE_OTHER): Payer: 59 | Admitting: Psychiatry

## 2016-08-26 DIAGNOSIS — F320 Major depressive disorder, single episode, mild: Secondary | ICD-10-CM

## 2016-08-28 NOTE — Progress Notes (Signed)
   THERAPIST PROGRESS NOTE  Time: 3:35-4:30  Participation Level:Active  Behavioral Response:CasualAlertEuthymic  Type of Therapy: Individual Therapy   Treatment Goals addressed: emotion regulation, stress management, coping skills   Interventions:CBT, supportive, strength-based   Summary: SUHANA WILNER is a 36y.o. female who presents with depression and anxiety.   Suicidal/Homicidal:nowithout intent/plan   Therapist Response: Pt.'s first session since April of this year. Pt. Continues to present as talkative, stressed, engaged in the therapeutic process. Pt. Reports that she has been doing well, not depressed. Pt. Is nearing the anniversary of her father's death. Pt. Discussed disappointment with relationships with her father's family that she has had to end because they were not supportive. Pt. Discussed that her son was doing well and completed the school year despite behavior problems that he had during the school year. Pt. Discussed stressor related to her brother, his wife, and their children who have experienced neglect because of the mother's untreated mental health problems. Pt. Discussed loving the children, but the importance of having healthy boundaries with her brother so that she does not take on the responsibility of solving their problems. Pt. Discussed interest in having a significant other, but that she and her children are in a good place emotionally and not wanting to cause problems. Significant time in session processing all or nothing thinking related to relationships and developing belief that she can have a relationship and remain in an emotionally good place and what that would look like for her.  Plan: Pt. To return for follow-up in 4 weeks.  Diagnosis: Axis I:Depressive Disorder NOS   Axis AV:[id number]  Nancie Neas, [facility] 08/28/2016

## 2016-11-20 DIAGNOSIS — Z0000 Encounter for general adult medical examination without abnormal findings: Secondary | ICD-10-CM | POA: Diagnosis not present

## 2016-11-20 DIAGNOSIS — E039 Hypothyroidism, unspecified: Secondary | ICD-10-CM | POA: Diagnosis not present

## 2016-11-27 DIAGNOSIS — Z0000 Encounter for general adult medical examination without abnormal findings: Secondary | ICD-10-CM | POA: Diagnosis not present

## 2016-11-27 DIAGNOSIS — Z23 Encounter for immunization: Secondary | ICD-10-CM | POA: Diagnosis not present

## 2016-11-27 DIAGNOSIS — G43909 Migraine, unspecified, not intractable, without status migrainosus: Secondary | ICD-10-CM | POA: Diagnosis not present

## 2016-12-28 ENCOUNTER — Ambulatory Visit (HOSPITAL_COMMUNITY): Payer: Self-pay | Admitting: Psychiatry

## 2017-10-09 DIAGNOSIS — Z23 Encounter for immunization: Secondary | ICD-10-CM | POA: Diagnosis not present

## 2017-11-23 ENCOUNTER — Emergency Department (HOSPITAL_COMMUNITY)
Admission: EM | Admit: 2017-11-23 | Discharge: 2017-11-23 | Disposition: A | Discharge status: Home / Self Care | Payer: 59 | Attending: Emergency Medicine | Admitting: Emergency Medicine

## 2017-11-23 ENCOUNTER — Encounter (HOSPITAL_COMMUNITY): Payer: Self-pay

## 2017-11-23 ENCOUNTER — Emergency Department (HOSPITAL_COMMUNITY): Payer: 59

## 2017-11-23 ENCOUNTER — Other Ambulatory Visit: Payer: Self-pay

## 2017-11-23 DIAGNOSIS — R112 Nausea with vomiting, unspecified: Secondary | ICD-10-CM | POA: Diagnosis not present

## 2017-11-23 DIAGNOSIS — R1084 Generalized abdominal pain: Secondary | ICD-10-CM | POA: Diagnosis not present

## 2017-11-23 DIAGNOSIS — R1032 Left lower quadrant pain: Secondary | ICD-10-CM | POA: Diagnosis not present

## 2017-11-23 DIAGNOSIS — E039 Hypothyroidism, unspecified: Secondary | ICD-10-CM | POA: Diagnosis not present

## 2017-11-23 DIAGNOSIS — K56609 Unspecified intestinal obstruction, unspecified as to partial versus complete obstruction: Secondary | ICD-10-CM | POA: Diagnosis not present

## 2017-11-23 DIAGNOSIS — I10 Essential (primary) hypertension: Secondary | ICD-10-CM | POA: Insufficient documentation

## 2017-11-23 DIAGNOSIS — F17210 Nicotine dependence, cigarettes, uncomplicated: Secondary | ICD-10-CM | POA: Diagnosis not present

## 2017-11-23 DIAGNOSIS — R1110 Vomiting, unspecified: Secondary | ICD-10-CM | POA: Diagnosis not present

## 2017-11-23 DIAGNOSIS — Z79899 Other long term (current) drug therapy: Secondary | ICD-10-CM | POA: Diagnosis not present

## 2017-11-23 LAB — URINALYSIS, ROUTINE W REFLEX MICROSCOPIC
Bilirubin Urine: NEGATIVE
Glucose, UA: NEGATIVE mg/dL
Hgb urine dipstick: NEGATIVE
Ketones, ur: NEGATIVE mg/dL
Leukocytes, UA: NEGATIVE
Nitrite: NEGATIVE
Protein, ur: NEGATIVE mg/dL
Specific Gravity, Urine: 1.017 (ref 1.005–1.030)
pH: 6 (ref 5.0–8.0)

## 2017-11-23 LAB — COMPREHENSIVE METABOLIC PANEL
ALBUMIN: 3.7 g/dL (ref 3.5–5.0)
ALK PHOS: 77 U/L (ref 38–126)
ALT: 13 U/L (ref 0–44)
AST: 17 U/L (ref 15–41)
Anion gap: 10 (ref 5–15)
BILIRUBIN TOTAL: 1.1 mg/dL (ref 0.3–1.2)
CALCIUM: 9.1 mg/dL (ref 8.9–10.3)
CO2: 25 mmol/L (ref 22–32)
CREATININE: 0.74 mg/dL (ref 0.44–1.00)
Chloride: 105 mmol/L (ref 98–111)
GFR calc Af Amer: >60 mL/min (ref >60)
GLUCOSE: 105 mg/dL — AB (ref 70–99)
Potassium: 3.2 mmol/L — ABNORMAL LOW (ref 3.5–5.1)
Sodium: 140 mmol/L (ref 135–145)
TOTAL PROTEIN: 7.5 g/dL (ref 6.5–8.1)

## 2017-11-23 LAB — CBC
HCT: 42.3 % (ref 36.0–46.0)
Hemoglobin: 13.5 g/dL (ref 12.0–15.0)
MCH: 31.7 pg (ref 26.0–34.0)
MCHC: 31.9 g/dL (ref 30.0–36.0)
MCV: 99.3 fL (ref 80.0–100.0)
PLATELETS: 306 10*3/uL (ref 150–400)
RBC: 4.26 MIL/uL (ref 3.87–5.11)
RDW: 13.9 % (ref 11.5–15.5)
WBC: 10.4 10*3/uL (ref 4.0–10.5)
nRBC: 0 % (ref 0.0–0.2)

## 2017-11-23 LAB — I-STAT BETA HCG BLOOD, ED (MC, WL, AP ONLY)

## 2017-11-23 LAB — LIPASE, BLOOD: Lipase: 26 U/L (ref 11–51)

## 2017-11-23 MED ORDER — ONDANSETRON 4 MG PO TBDP: 4.0000 mg | ORAL_TABLET | Freq: Three times a day (TID) | ORAL | 0 refills | Status: AC | PRN

## 2017-11-23 MED ORDER — IOHEXOL 300 MG/ML  SOLN
100.0000 mL | Freq: Once | INTRAMUSCULAR | Status: AC | PRN
  Administered 2017-11-23: 100 mL via INTRAVENOUS

## 2017-11-23 MED ORDER — DICYCLOMINE HCL 20 MG PO TABS
20.0000 mg | ORAL_TABLET | Freq: Two times a day (BID) | ORAL | 0 refills | Status: AC
Start: 2017-11-23 — End: ?

## 2017-11-23 MED ORDER — FAMOTIDINE 20 MG PO TABS: 20.0000 mg | ORAL_TABLET | Freq: Two times a day (BID) | ORAL | 0 refills | Status: DC

## 2017-11-23 MED ORDER — IOPAMIDOL (ISOVUE-300) INJECTION 61%: 100.0000 mL | Freq: Once | INTRAVENOUS | Status: DC | PRN

## 2017-11-23 MED ORDER — FAMOTIDINE 20 MG PO TABS: 20.0000 mg | ORAL_TABLET | Freq: Two times a day (BID) | ORAL | 0 refills | Status: AC

## 2017-11-23 MED ORDER — ONDANSETRON 4 MG PO TBDP: 4.0000 mg | ORAL_TABLET | Freq: Once | ORAL | Status: DC | PRN

## 2017-11-23 NOTE — ED Provider Notes (Signed)
[place] EMERGENCY DEPARTMENT Provider Note   CSN: [id number] Arrival date & time: 11/23/17  1050     History   Chief Complaint Chief Complaint  Patient presents with  . Abdominal Pain  . Nausea    HPI Diana Francis is a 37 y.o. female.  Patient with history of hysterectomy presents the emergency department today with complaints of abdominal pain over the past 5 to 6 days.  Patient states that she has had constipation and several episodes of watery stool intermittently over the past week.  She had a large bowel movement 3 days ago which she reports was green.  Yesterday she passed a small hard ball of stool.  Patient had a short-lived episode of vomiting yesterday and dry heaves this morning.  Patient has had some generalized waxing and waning abdominal pain that does not radiate.  Seems to be worse in the lower abdomen.  No urinary symptoms.  She saw her primary care doctor today who did an x-ray that was concerning for small bowel obstruction and she was referred to the emergency department for further evaluation.  Not currently passing gas.     Past Medical History:  Diagnosis Date  . Anemia    history of anemia  . Depression    Bipolar  . GERD (gastroesophageal reflux disease)   . Headache(784.0)   . HTN (hypertension)    no meds- per pt white coat syndrome  . Hypothyroidism   . Thyroid disease     Patient Active Problem List   Diagnosis Date Noted  . Depression, major, recurrent, moderate ([place]) 10/03/2015    Class: Chronic  . Depression 12/13/2012  . PTSD (post-traumatic stress disorder) 09/27/2012    Past Surgical History:  Procedure Laterality Date  . DILATION AND CURETTAGE OF UTERUS N/A 11/24/2012   Procedure: DILATATION AND CURETTAGE;  Surgeon: Emily Filbert, MD;  Location: [facility];  Service: Gynecology;  Laterality: N/A;  . LAPAROSCOPY N/A 11/24/2012   Procedure: LAPAROSCOPY DIAGNOSTIC;  Surgeon: Emily Filbert, MD;  Location: [facility] [facility];   Service: Gynecology;  Laterality: N/A;  . TUBAL LIGATION    . VAGINAL HYSTERECTOMY Bilateral 01/04/2013   Procedure: HYSTERECTOMY VAGINAL with Bilateral Fallopian Tubes;  Surgeon: Emily Filbert, MD;  Location: [facility];  Service: Gynecology;  Laterality: Bilateral;     OB History    Gravida  3   Para  2   Term      Preterm      AB  1   Living        SAB  1   TAB      Ectopic      Multiple      Live Births               Home Medications    Prior to Admission medications   Medication Sig Start Date End Date Taking? Authorizing Provider  docusate sodium (COLACE) 100 MG capsule Take 1 capsule (100 mg total) by mouth every 12 (twelve) hours. 12/04/15   Mackuen, Courteney Lyn, MD  hydrOXYzine (ATARAX/VISTARIL) 50 MG tablet Take 1 tab at bed time 12/18/15   Arfeen, Arlyce Harman, MD  lamoTRIgine (LAMICTAL) 25 MG tablet Take 2 tablets (50 mg total) by mouth daily. 12/18/15   Arfeen, Arlyce Harman, MD  polyethylene glycol [facility]) packet Take 17 g by mouth daily. 12/04/15   Mackuen, Courteney Lyn, MD  Vitamin D, Ergocalciferol, (DRISDOL) 50000 UNITS CAPS capsule Take 50,000 Units by mouth  every 7 (seven) days. Patient takes on Tuesday of each week 03/11/13   [provider]    Family History Family History  Problem Relation Age of Onset  . Depression Mother   . Hypertension Mother   . Bipolar disorder Maternal Aunt   . Diabetes Maternal Aunt   . Cancer Maternal Grandfather        lymphoma  . Hypertension Father   . Diabetes Maternal Uncle     Social History Social History   Tobacco Use  . Smoking status: Current Every Day Smoker    Packs/day: 0.50    Types: Cigarettes  . Smokeless tobacco: Never Used  Substance Use Topics  . Alcohol use: No    Alcohol/week: 0.0 standard drinks  . Drug use: No     Allergies   Bactrim [sulfamethoxazole-trimethoprim]   Review of Systems Review of Systems  Constitutional: Negative for fever.  HENT: Negative for rhinorrhea  and sore throat.   Eyes: Negative for redness.  Respiratory: Negative for cough.   Cardiovascular: Negative for chest pain.  Gastrointestinal: Positive for abdominal pain, constipation, diarrhea, nausea and vomiting. Negative for blood in stool.  Genitourinary: Negative for dysuria.  Musculoskeletal: Negative for myalgias.  Skin: Negative for rash.  Neurological: Negative for headaches.     Physical Exam Updated Vital Signs BP (!) 131/91   Pulse 81   Temp 98.4 F (36.9 C) (Oral)   Resp (!) 22   Ht 5\' 5"  (1.651 m)   Wt 61.7 kg   LMP 12/19/2012   SpO2 100%   BMI 22.63 kg/m   Physical Exam  Constitutional: She appears well-developed and well-nourished.  HENT:  Head: Normocephalic and atraumatic.  Eyes: Conjunctivae are normal. Right eye exhibits no discharge. Left eye exhibits no discharge.  Neck: Normal range of motion. Neck supple.  Cardiovascular: Normal rate, regular rhythm and normal heart sounds.  Pulmonary/Chest: Effort normal and breath sounds normal.  Abdominal: Soft. There is generalized tenderness. There is no rebound, no guarding, no tenderness at McBurney's point and negative Murphy's sign.  Neurological: She is alert.  Skin: Skin is warm and dry.  Psychiatric: She has a normal mood and affect.  Nursing note and vitals reviewed.    ED Treatments / Results  Labs (all labs ordered are listed, but only abnormal results are displayed) Labs Reviewed  COMPREHENSIVE METABOLIC PANEL - Abnormal; Notable for the following components:      Result Value   Potassium 3.2 (*)    Glucose, Bld 105 (*)    BUN <5 (*)    All other components within normal limits  URINALYSIS, ROUTINE W REFLEX MICROSCOPIC - Abnormal; Notable for the following components:   APPearance HAZY (*)    All other components within normal limits  LIPASE, BLOOD  CBC  I-STAT BETA HCG BLOOD, ED ([facility], [facility], [facility] ONLY)    EKG None  Radiology Ct Abdomen Pelvis W Contrast  Result Date:  11/23/2017 CLINICAL DATA:  Abdominal pain, nausea and vomiting. EXAM: CT ABDOMEN AND PELVIS WITH CONTRAST TECHNIQUE: Multidetector CT imaging of the abdomen and pelvis was performed using the standard protocol following bolus administration of intravenous contrast. CONTRAST:  167mL OMNIPAQUE IOHEXOL 300 MG/ML  SOLN COMPARISON:  12/04/2015 FINDINGS: Lower chest: No acute abnormality. Hepatobiliary: No focal liver abnormality is seen. No gallstones, gallbladder wall thickening, or biliary dilatation. Pancreas: There is no main duct dilatation, inflammation or mass identified. Spleen: Normal in size without focal abnormality. Adrenals/Urinary Tract: The adrenal glands appear normal.  Unremarkable appearance of the kidneys. The urinary bladder is negative. Stomach/Bowel: The stomach appears nondistended. Within the left upper quadrant of the abdomen there is a solid-appearing nodule which appears to arise from the posterior wall of the gastric antrum measuring 2.8 by 2.7 by 2.8 cm. This appears closely associated with the tail of pancreas. On previous exam this measured 2.8 by 2.5 by 2.3 cm. The small bowel loops appear unremarkable. The appendix is difficult to identified separate from the right lower quadrant bowel loops. No secondary signs of acute appendicitis noted. No pathologic dilatation of the colon. Vascular/Lymphatic: Normal appearance of the abdominal aorta. No abdominal adenopathy identified. No pelvic or inguinal adenopathy. Reproductive: Status post hysterectomy. Normal physiologic appearance of the ovaries. No adnexal mass. Other: There is a small amount of free fluid within the pelvis. Musculoskeletal: No acute or significant osseous findings. IMPRESSION: 1. No acute findings identified within the abdomen or pelvis. The appendix is not visualized on this exam however there are no secondary signs of acute appendicitis. 2. Small volume of free fluid noted within the pelvis. 3. There is a nodule/mass  within the left upper quadrant of the abdomen. This appears closely associated with the posterior wall of pelvis and tail of pancreas. When compared with exam from 12/04/2015 this demonstrates mild increase in size in the interval. If arising from the stomach this may represent a slow growing GIST tumor. Alternatively, if arising from the pancreas, this may represent a slow growing, indolent neoplasm such as an islet cell tumor. Advise further evaluation with nonemergent GI consultation. Electronically Signed   By: Kerby Moors M.D.   On: 11/23/2017 18:31    Procedures Procedures (including critical care time)  Medications Ordered in ED Medications  ondansetron (ZOFRAN-ODT) disintegrating tablet 4 mg (has no administration in time range)     Initial Impression / Assessment and Plan / ED Course  I have reviewed the triage vital signs and the nursing notes.  Pertinent labs & imaging results that were available during my care of the patient were reviewed by me and considered in my medical decision making (see chart for details).     Patient seen and examined. Work-up initiated. Declines pain or nausea medication at this time. Actively belching but not vomiting or nauseous per her.   Vital signs reviewed and are as follows: BP (!) 131/91   Pulse 81   Temp 98.4 F (36.9 C) (Oral)   Resp (!) 22   Ht 5\' 5"  (1.651 m)   Wt 61.7 kg   LMP 12/19/2012   SpO2 100%   BMI 22.63 kg/m   6:59 PM CT is reassuring.  Patient does have a small mass related to the stomach and near the pancreas that is slightly larger than on a CT performed 2 years ago.  Patient informed of this result and she will be given GI follow-up.  At this point, we will work on controlling her symptoms.  She will be discharged home with Zofran, Bentyl, Pepcid for symptom control.  The patient was urged to return to the Emergency Department immediately with worsening of current symptoms, worsening abdominal pain, persistent  vomiting, blood noted in stools, fever, or any other concerns. The patient verbalized understanding.    Final Clinical Impressions(s) / ED Diagnoses   Final diagnoses:  Non-intractable vomiting with nausea, unspecified vomiting type  Generalized abdominal pain   Patient with abdominal pain.  Initial concern was for small bowel obstruction.  CT performed and is largely unremarkable.  She has a small mass which will be followed by GI as above.  Suspect that this is unrelated to patient's symptoms today.  Vitals are stable, no fever. Labs reassuring. No signs of dehydration, patient is tolerating PO's. Lungs are clear and no signs suggestive of PNA. Low concern for appendicitis, cholecystitis, pancreatitis, ruptured viscus, UTI, kidney stone, aortic dissection, aortic aneurysm or other emergent abdominal etiology. Supportive therapy indicated with return if symptoms worsen.    ED Discharge Orders         Ordered    ondansetron (ZOFRAN ODT) 4 MG disintegrating tablet  Every 8 hours PRN     11/23/17 1850    dicyclomine (BENTYL) 20 MG tablet  2 times daily     11/23/17 1850    famotidine (PEPCID) 20 MG tablet  2 times daily,   Status:  Discontinued     11/23/17 1851    famotidine (PEPCID) 20 MG tablet  2 times daily     11/23/17 1851           Carlisle Cater, PA-C 11/23/17 1902    Lennice Sites, DO 11/24/17 0028

## 2017-11-23 NOTE — ED Notes (Signed)
Patient verbalizes understanding of discharge instructions. Opportunity for questioning and answers were provided. Armband removed by staff, pt discharged from ED ambulatory. Pt provided with prescription savings card and work note.

## 2017-11-23 NOTE — Discharge Instructions (Signed)
Please read and follow all provided instructions.  Your diagnoses today include:  1. Non-intractable vomiting with nausea, unspecified vomiting type   2. Generalized abdominal pain     Tests performed today include:  Blood counts and electrolytes  Blood tests to check liver and kidney function  Blood tests to check pancreas function  Urine test to look for infection  CT of your abdomen and pelvis does not show any dangerous problems, however you do have a small mass near the stomach which is slightly bigger than on a CT you had done 2 years ago  Vital signs. See below for your results today.   Medications prescribed:   Bentyl - medication for intestinal cramps and spasms   Pepcid (famotidine) - antihistamine  You can find this medication over-the-counter.   DO NOT exceed:   20mg  Pepcid every 12 hours   Zofran (ondansetron) - for nausea and vomiting  Take any prescribed medications only as directed.  Home care instructions:   Follow any educational materials contained in this packet.  Follow-up instructions: Please follow-up with your primary care provider in the next 3 days for further evaluation of your symptoms for a recheck and see the gastroenterologist (stomach doctor) referral to follow-up on your CT scan in the next week or 2.  Return instructions:  SEEK IMMEDIATE MEDICAL ATTENTION IF:  The pain does not go away or becomes severe   A temperature above 101F develops   Repeated vomiting occurs (multiple episodes)   The pain becomes localized to portions of the abdomen. The right side could possibly be appendicitis. In an adult, the left lower portion of the abdomen could be colitis or diverticulitis.   Blood is being passed in stools or vomit (bright red or black tarry stools)   You develop chest pain, difficulty breathing, dizziness or fainting, or become confused, poorly responsive, or inconsolable (young children)  If you have any other emergent  concerns regarding your health  Additional Information: Abdominal (belly) pain can be caused by many things. Your caregiver performed an examination and possibly ordered blood/urine tests and imaging (CT scan, x-rays, ultrasound). Many cases can be observed and treated at home after initial evaluation in the emergency department. Even though you are being discharged home, abdominal pain can be unpredictable. Therefore, you need a repeated exam if your pain does not resolve, returns, or worsens. Most patients with abdominal pain don't have to be admitted to the hospital or have surgery, but serious problems like appendicitis and gallbladder attacks can start out as nonspecific pain. Many abdominal conditions cannot be diagnosed in one visit, so follow-up evaluations are very important.  Your vital signs today were: BP (!) 131/91    Pulse 81    Temp 98.4 F (36.9 C) (Oral)    Resp (!) 22    Ht 5\' 5"  (1.651 m)    Wt 61.7 kg    LMP 12/19/2012    SpO2 100%    BMI 22.63 kg/m  If your blood pressure (bp) was elevated above 135/85 this visit, please have this repeated by your doctor within one month. --------------

## 2017-11-23 NOTE — ED Notes (Signed)
Patient in lobby, crying due to pain. Patient states pain has increased to a 7 since her arrival. Pt also complaining of a headache.

## 2017-11-23 NOTE — ED Notes (Signed)
Results reviewed, no changes in acuity at this time 

## 2017-11-23 NOTE — ED Triage Notes (Signed)
Pt. Was sent to [place] by [facility] after having an abdominal x-ray due to abdominal pain and nausea.  Pt. Had vomitng yesterday but not today.  X-ray showed air-filled loops of bowel and air win the small intenstine. ( Pt. Has a copy of result)  Pt. Has lt. Lower abominal pan and tenderness.  Pt. Is alert and oriented X4.

## 2017-11-25 DIAGNOSIS — R1032 Left lower quadrant pain: Secondary | ICD-10-CM | POA: Diagnosis not present

## 2017-11-26 ENCOUNTER — Encounter: Payer: Self-pay | Admitting: Nurse Practitioner

## 2017-11-26 ENCOUNTER — Ambulatory Visit: Payer: 59 | Admitting: Nurse Practitioner

## 2017-11-26 VITALS — BP 120/60 | HR 76 | Ht 65.0 in | Wt 135.0 lb

## 2017-11-26 DIAGNOSIS — R933 Abnormal findings on diagnostic imaging of other parts of digestive tract: Secondary | ICD-10-CM

## 2017-11-26 DIAGNOSIS — R198 Other specified symptoms and signs involving the digestive system and abdomen: Secondary | ICD-10-CM

## 2017-11-26 NOTE — Progress Notes (Signed)
ASSESSMENT  / PLAN:   110. 37 yo female with female episode of severe lower abdominal pain, N/V and   SBO on abdominal films.  Subsequent CT scan (later that day) didn't corroborate xray  findings.  Symptoms do sound like at least a partial SBO, which has clearly now resolved.  Her risk factor for SBO may be adhesions from GYN surgery.   2. Small nodule / mass ? associated with posterior wall of stomach / pancreatic tail. Lesion  Incidental finding on CT scan but there is mention of interval enlargement compared to  two years ago.  -Patient will need EGD as well as EUS for evaluation of this lesion. I will forward this note to Dr. Ardis Hughs for review.  The risks and benefits of EGD / EUS were discussed and the patient agrees to proceed.   3. Irregular bowel habits for two years. Better after diet changes and weight loss but still has  stools ranging from loose to hard.  -I would like to start with fiber. Trial of daily Benefiber. Further recommendations  depending on response to fiber.   HPI:     Chief Complaint:    Patient is 37 year old relatively heathy female, new to the practice, referred by Dr. Ashby Dawes, for evaluation of abdominal pain and abnormal CT scan. Ndeye gives a two year history of irregular bowel habits. She was told at some point that bowel changes were secondary to diverticulitis. She made drastic dietary changes leading to significant weight loss and did have some improvement in bowel habits overall.  However, several days ago patient became constipated. After a few days she developed acute severe lower crampy abdominal pain / lower back pain followed by nausea / vomiting. She did eventually have a BM which was a large green stool She was evaluated by PCP on 11/23/2017.   Plain films of the abdomen suggested SBO.  Patient was sent to the ED.   Data reviewed: Comprehensive metabolic profile unremarkable except for potassium of 3.2 CBC  normal Pregnancy test negative UA unremarkable CTAP with contrast: negative for bowel obstruction but did show a small nodule/mass within the LUQ of the abdomen. This appears closely associated with the posterior wall of pelvis and tail of pancreas. When compared with exam from 12/04/2015 this demonstrates mild increase in size in the interval. If arising from the stomach this may represent a slow growing GIST tumor. Alternatively, if arising from the pancreas, this may represent a slow growing, indolent neoplasm such as an islet cell tumor.   Mellonie's acute symptoms have resolved. She doesn't have a history of SBO. She does give a hx of laparoscopic surgery done to look for scar tissue around the time that her Essure (birth control device) lodged in an incorrect location. She eventually needed a hysterectomy.    Past Medical History:  Diagnosis Date  . Anemia    history of anemia  . Depression    Bipolar  . GERD (gastroesophageal reflux disease)   . Headache(784.0)   . HTN (hypertension)    no meds- per pt white coat syndrome  . Hypothyroidism   . Thyroid disease     Past Surgical History:  Procedure Laterality Date  . DILATION AND CURETTAGE OF UTERUS N/A 11/24/2012   Procedure: DILATATION AND CURETTAGE;  Surgeon: Emily Filbert, MD;  Location: [facility];  Service: Gynecology;  Laterality: N/A;  . LAPAROSCOPY N/A 11/24/2012  Procedure: LAPAROSCOPY DIAGNOSTIC;  Surgeon: Emily Filbert, MD;  Location: [facility];  Service: Gynecology;  Laterality: N/A;  . TUBAL LIGATION    . VAGINAL HYSTERECTOMY Bilateral 01/04/2013   Procedure: HYSTERECTOMY VAGINAL with Bilateral Fallopian Tubes;  Surgeon: Emily Filbert, MD;  Location: [facility];  Service: Gynecology;  Laterality: Bilateral;   Family History  Problem Relation Age of Onset  . Depression Mother   . Hypertension Mother   . Bipolar disorder Maternal Aunt   . Diabetes Maternal Aunt   . Cancer Maternal Grandfather        lymphoma  .  Hypertension Father   . Diabetes Maternal Uncle    Social History   Tobacco Use  . Smoking status: Current Every Day Smoker    Packs/day: 0.50    Types: Cigarettes  . Smokeless tobacco: Never Used  Substance Use Topics  . Alcohol use: No    Alcohol/week: 0.0 standard drinks  . Drug use: No   Current Outpatient Medications  Medication Sig Dispense Refill  . dicyclomine (BENTYL) 20 MG tablet Take 1 tablet (20 mg total) by mouth 2 (two) times daily. 20 tablet 0  . famotidine (PEPCID) 20 MG tablet Take 1 tablet (20 mg total) by mouth 2 (two) times daily. 30 tablet 0  . ondansetron (ZOFRAN ODT) 4 MG disintegrating tablet Take 1 tablet (4 mg total) by mouth every 8 (eight) hours as needed for nausea or vomiting. 10 tablet 0  . Vitamin D, Ergocalciferol, (DRISDOL) 50000 UNITS CAPS capsule Take 50,000 Units by mouth every Sunday.      No current facility-administered medications for this visit.    Allergies  Allergen Reactions  . Bactrim [Sulfamethoxazole-Trimethoprim] Nausea And Vomiting     Review of Systems: All systems reviewed and negative except where noted in HPI.   Serum creatinine: 0.74 mg/dL 11/23/17 1154 Estimated creatinine clearance: 85.8 mL/min   Physical Exam:    Wt Readings from Last 3 Encounters:  11/23/17 136 lb (61.7 kg)  12/02/15 178 lb (80.7 kg)  03/02/14 167 lb (75.8 kg)    LMP 12/19/2012  Constitutional:  Pleasant female in no acute distress. Psychiatric: Normal mood and affect. Behavior is normal. EENT: Pupils normal.  Conjunctivae are normal. No scleral icterus. Neck supple.  Cardiovascular: Normal rate, regular rhythm. No edema Pulmonary/chest: Effort normal and breath sounds normal. No wheezing, rales or rhonchi. Abdominal: Soft, nondistended, nontender. Bowel sounds active throughout. There are no masses palpable. No hepatomegaly. Neurological: Alert and oriented to person place and time. Skin: Skin is warm and dry. No rashes noted.  Tye Savoy, NP  11/26/2017, 8:43 AM   [place], O'Laf, PA-C

## 2017-11-26 NOTE — Patient Instructions (Signed)
If you are age 38 or older, your body mass index should be between 23-30. Your Body mass index is 22.47 kg/m. If this is out of the aforementioned range listed, please consider follow up with your Primary Care Provider.  If you are age 48 or younger, your body mass index should be between 19-25. Your Body mass index is 22.47 kg/m. If this is out of the aformentioned range listed, please consider follow up with your Primary Care Provider.   Koren Shiver, RN will contact you regarding EGD at [facility] daily. (over-the-counter)  Thank you for choosing me and [facility].   Tye Savoy, NP

## 2017-11-28 ENCOUNTER — Encounter: Payer: Self-pay | Admitting: Nurse Practitioner

## 2017-11-29 ENCOUNTER — Telehealth: Payer: Self-pay | Admitting: Nurse Practitioner

## 2017-11-29 ENCOUNTER — Telehealth: Payer: Self-pay

## 2017-11-29 NOTE — Telephone Encounter (Signed)
Paula, thanks. She has a sold mass abutting stomach/pancreas. It was present in 2017 but not mentioned on CT report. May have grown a bit since then. She needs upper EUS, radial +/- linear, next available EUS MAC day with me (for abdominal mass) Thanks. I'll send to [facility] as well.  Raynard Mapps, See above. 

## 2017-11-29 NOTE — Telephone Encounter (Signed)
She is taking the Benefiber as directed once daily.  Her stool continues to be green. She notes the pain in on her left side. She feels somewhat nauseous but does not have vomiting. T 99.3 Diana Francis is working on the EUS which looks like it will be 12/30/17. Please advise.

## 2017-11-29 NOTE — Telephone Encounter (Signed)
-----   Message from Milus Banister, MD sent at 11/29/2017  6:11 AM EST -----   ----- Message ----- From: Willia Craze, NP Sent: 11/28/2017  11:15 PM EST To: Milus Banister, MD

## 2017-11-29 NOTE — Progress Notes (Signed)
Diana Francis, thanks. She has a sold mass abutting stomach/pancreas. It was present in 2017 but not mentioned on CT report. May have grown a bit since then. She needs upper EUS, radial +/- linear, next available EUS MAC day with me (for abdominal mass) Thanks. I'll send to Patty as well.  Patty, See above. 

## 2017-11-30 ENCOUNTER — Other Ambulatory Visit: Payer: Self-pay

## 2017-11-30 DIAGNOSIS — R1900 Intra-abdominal and pelvic swelling, mass and lump, unspecified site: Secondary | ICD-10-CM

## 2017-11-30 DIAGNOSIS — R198 Other specified symptoms and signs involving the digestive system and abdomen: Secondary | ICD-10-CM

## 2017-11-30 DIAGNOSIS — R1012 Left upper quadrant pain: Secondary | ICD-10-CM | POA: Diagnosis not present

## 2017-11-30 DIAGNOSIS — R933 Abnormal findings on diagnostic imaging of other parts of digestive tract: Secondary | ICD-10-CM

## 2017-11-30 DIAGNOSIS — R197 Diarrhea, unspecified: Secondary | ICD-10-CM | POA: Diagnosis not present

## 2017-11-30 NOTE — Telephone Encounter (Signed)
Beth, I don't know why stools would be green except for what she may be eating?  Does she feel distended? Is pain like when she say her PCP and xrays suggested SBO? If so then we can get a KUB to look for obstruction. If having diarrhea then should rule out C-diff. Thanks

## 2017-11-30 NOTE — Telephone Encounter (Signed)
EUS scheduled, pt instructed and medications reviewed.  Patient instructions mailed to home.  Patient to call with any questions or concerns.  

## 2017-11-30 NOTE — Telephone Encounter (Signed)
Patient answers my call while at her PCP office. She says her pain is improved over yesterday. She has the pain "mostly when I have go to the bathroom." She asks when will she be scheduled for the EUS.  Dr Ardis Hughs will review her records. She will hear from him soon, but no exact date as of yet.

## 2017-11-30 NOTE — Telephone Encounter (Signed)
Diana Francis, thanks. She has a sold mass abutting stomach/pancreas. It was present in 2017 but not mentioned on CT report. May have grown a bit since then. She needs upper EUS, radial +/- linear, next available EUS MAC day with me (for abdominal mass) Thanks. I'll send to [facility] as well.  Diana Francis, See above.

## 2017-12-01 DIAGNOSIS — R197 Diarrhea, unspecified: Secondary | ICD-10-CM | POA: Diagnosis not present

## 2017-12-14 DIAGNOSIS — R1902 Left upper quadrant abdominal swelling, mass and lump: Secondary | ICD-10-CM | POA: Diagnosis not present

## 2017-12-14 DIAGNOSIS — R1012 Left upper quadrant pain: Secondary | ICD-10-CM | POA: Diagnosis not present

## 2017-12-14 DIAGNOSIS — R634 Abnormal weight loss: Secondary | ICD-10-CM | POA: Diagnosis not present

## 2017-12-28 ENCOUNTER — Encounter (HOSPITAL_COMMUNITY): Payer: Self-pay | Admitting: *Deleted

## 2017-12-28 ENCOUNTER — Other Ambulatory Visit: Payer: Self-pay

## 2017-12-30 ENCOUNTER — Telehealth: Payer: Self-pay

## 2017-12-30 ENCOUNTER — Ambulatory Visit (HOSPITAL_COMMUNITY)
Admission: RE | Admit: 2017-12-30 | Discharge: 2017-12-30 | Disposition: A | Discharge status: Home / Self Care | Payer: 59 | Source: Ambulatory Visit | Attending: Gastroenterology | Admitting: Gastroenterology

## 2017-12-30 ENCOUNTER — Ambulatory Visit (HOSPITAL_COMMUNITY): Payer: 59 | Admitting: Anesthesiology

## 2017-12-30 ENCOUNTER — Other Ambulatory Visit: Payer: Self-pay

## 2017-12-30 ENCOUNTER — Encounter (HOSPITAL_COMMUNITY)
Admission: RE | Disposition: A | Discharge status: Home / Self Care | Payer: Self-pay | Source: Ambulatory Visit | Attending: Gastroenterology

## 2017-12-30 ENCOUNTER — Encounter (HOSPITAL_COMMUNITY): Payer: Self-pay | Admitting: Emergency Medicine

## 2017-12-30 DIAGNOSIS — K869 Disease of pancreas, unspecified: Secondary | ICD-10-CM | POA: Diagnosis present

## 2017-12-30 DIAGNOSIS — D490 Neoplasm of unspecified behavior of digestive system: Secondary | ICD-10-CM | POA: Diagnosis not present

## 2017-12-30 DIAGNOSIS — K8689 Other specified diseases of pancreas: Secondary | ICD-10-CM

## 2017-12-30 DIAGNOSIS — R198 Other specified symptoms and signs involving the digestive system and abdomen: Secondary | ICD-10-CM

## 2017-12-30 DIAGNOSIS — R933 Abnormal findings on diagnostic imaging of other parts of digestive tract: Secondary | ICD-10-CM

## 2017-12-30 DIAGNOSIS — R1900 Intra-abdominal and pelvic swelling, mass and lump, unspecified site: Secondary | ICD-10-CM

## 2017-12-30 DIAGNOSIS — F17210 Nicotine dependence, cigarettes, uncomplicated: Secondary | ICD-10-CM | POA: Diagnosis not present

## 2017-12-30 DIAGNOSIS — I10 Essential (primary) hypertension: Secondary | ICD-10-CM | POA: Diagnosis not present

## 2017-12-30 HISTORY — PX: FINE NEEDLE ASPIRATION: SHX5430

## 2017-12-30 HISTORY — PX: ESOPHAGOGASTRODUODENOSCOPY: SHX5428

## 2017-12-30 HISTORY — PX: EUS: SHX5427

## 2017-12-30 SURGERY — UPPER ENDOSCOPIC ULTRASOUND (EUS) RADIAL
Anesthesia: Monitor Anesthesia Care

## 2017-12-30 MED ORDER — MEPERIDINE HCL 25 MG/ML IJ SOLN: 6.2500 mg | INTRAMUSCULAR | Status: DC | PRN

## 2017-12-30 MED ORDER — PROPOFOL 500 MG/50ML IV EMUL
INTRAVENOUS | Status: DC | PRN
  Administered 2017-12-30: 300 ug/kg/min via INTRAVENOUS

## 2017-12-30 MED ORDER — PROPOFOL 10 MG/ML IV BOLUS
INTRAVENOUS | Status: AC
  Filled 2017-12-30: qty 20

## 2017-12-30 MED ORDER — PROPOFOL 10 MG/ML IV BOLUS
INTRAVENOUS | Status: AC
  Filled 2017-12-30: qty 40

## 2017-12-30 MED ORDER — GLYCOPYRROLATE 0.2 MG/ML IJ SOLN
INTRAMUSCULAR | Status: DC | PRN
  Administered 2017-12-30: 0.1 mg via INTRAVENOUS

## 2017-12-30 MED ORDER — SODIUM CHLORIDE 0.9 % IV SOLN: INTRAVENOUS | Status: DC

## 2017-12-30 MED ORDER — PROMETHAZINE HCL 25 MG/ML IJ SOLN: 6.2500 mg | INTRAMUSCULAR | Status: DC | PRN

## 2017-12-30 MED ORDER — LIDOCAINE HCL (CARDIAC) PF 100 MG/5ML IV SOSY
PREFILLED_SYRINGE | INTRAVENOUS | Status: DC | PRN
  Administered 2017-12-30: 50 mg via INTRAVENOUS

## 2017-12-30 MED ORDER — MIDAZOLAM HCL 2 MG/2ML IJ SOLN: 0.5000 mg | Freq: Once | INTRAMUSCULAR | Status: DC | PRN

## 2017-12-30 MED ORDER — LACTATED RINGERS IV SOLN
INTRAVENOUS | Status: DC
  Administered 2017-12-30: 08:00:00 via INTRAVENOUS

## 2017-12-30 NOTE — Telephone Encounter (Signed)
-----   Message from Milus Banister, MD sent at 12/30/2017  9:15 AM EST ----- Chong Sicilian, She needs referral to Dr. 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     Thanks all.

## 2017-12-30 NOTE — Anesthesia Preprocedure Evaluation (Addendum)
Anesthesia Evaluation  Patient identified by MRN, date of birth, ID band Patient awake    Reviewed: Allergy & Precautions, NPO status , Patient's Chart, lab work & pertinent test results  History of Anesthesia Complications Negative for: history of anesthetic complications  Airway Mallampati: I  TM Distance: >3 FB Neck ROM: Full    Dental  (+) Dental Advisory Given, Missing   Pulmonary Current Smoker,    breath sounds clear to auscultation       Cardiovascular hypertension (does not require medication), (-) angina Rhythm:Regular Rate:Normal     Neuro/Psych  Headaches,    GI/Hepatic Neg liver ROS, GERD  Controlled,  Endo/Other  negative endocrine ROS  Renal/GU      Musculoskeletal   Abdominal   Peds  Hematology negative hematology ROS (+)   Anesthesia Other Findings   Reproductive/Obstetrics S/p hysterectomy                            Anesthesia Physical Anesthesia Plan  ASA: II  Anesthesia Plan: MAC   Post-op Pain Management:    Induction:   PONV Risk Score and Plan: 1 and Treatment may vary due to age or medical condition  Airway Management Planned: Natural Airway and Nasal Cannula  Additional Equipment:   Intra-op Plan:   Post-operative Plan:   Informed Consent: I have reviewed the patients History and Physical, chart, labs and discussed the procedure including the risks, benefits and alternatives for the proposed anesthesia with the patient or authorized representative who has indicated his/her understanding and acceptance.   Dental advisory given  Plan Discussed with: CRNA and Surgeon  Anesthesia Plan Comments: (Plan routine monitors, MAC)        Anesthesia Quick Evaluation

## 2017-12-30 NOTE — Telephone Encounter (Signed)
[facility] referral made records faxed.

## 2017-12-30 NOTE — Anesthesia Postprocedure Evaluation (Signed)
Anesthesia Post Note  Patient: Diana Francis  Procedure(s) Performed: UPPER ENDOSCOPIC ULTRASOUND (EUS) RADIAL (N/A ) FINE NEEDLE ASPIRATION (FNA) RADIAL     Patient location during evaluation: Endoscopy Anesthesia Type: MAC Level of consciousness: awake and alert, oriented and patient cooperative Pain management: pain level controlled Vital Signs Assessment: post-procedure vital signs reviewed and stable Respiratory status: spontaneous breathing, nonlabored ventilation and respiratory function stable Cardiovascular status: blood pressure returned to baseline and stable Postop Assessment: able to ambulate and no apparent nausea or vomiting Anesthetic complications: no    Last Vitals:  Vitals:   12/30/17 0941 12/30/17 0950  BP: (!) 171/105 (!) 163/96  Pulse: 60 (!) 51  Resp: 14 15  Temp:    SpO2: 100% 100%    Last Pain:  Vitals:   12/30/17 0950  TempSrc:   PainSc: 0-No pain                 Audry Pecina,E. Demya Scruggs

## 2017-12-30 NOTE — Transfer of Care (Signed)
Immediate Anesthesia Transfer of Care Note  Patient: Diana Francis  Procedure(s) Performed: UPPER ENDOSCOPIC ULTRASOUND (EUS) RADIAL (N/A ) FINE NEEDLE ASPIRATION (FNA) RADIAL  Patient Location: PACU  Anesthesia Type:MAC  Level of Consciousness: awake and patient cooperative  Airway & Oxygen Therapy: Patient Spontanous Breathing and Patient connected to face mask oxygen  Post-op Assessment: Report given to RN, Post -op Vital signs reviewed and stable and Patient moving all extremities X 4  Post vital signs: stable  Last Vitals:  Vitals Value Taken Time  BP    Temp    Pulse    Resp    SpO2      Last Pain:  Vitals:   12/30/17 0727  TempSrc: Oral         Complications: No apparent anesthesia complications

## 2017-12-30 NOTE — Discharge Instructions (Signed)
YOU HAD AN ENDOSCOPIC PROCEDURE TODAY: Refer to the procedure report and other information in the discharge instructions given to you for any specific questions about what was found during the examination. If this information does not answer your questions, please call [place] office at [phone] to clarify.  ° °YOU SHOULD EXPECT: Some feelings of bloating in the abdomen. Passage of more gas than usual. Walking can help get rid of the air that was put into your GI tract during the procedure and reduce the bloating. If you had a lower endoscopy (such as a colonoscopy or flexible sigmoidoscopy) you may notice spotting of blood in your stool or on the toilet paper. Some abdominal soreness may be present for a day or two, also. ° °DIET: Your first meal following the procedure should be a light meal and then it is ok to progress to your normal diet. A half-sandwich or bowl of soup is an example of a good first meal. Heavy or fried foods are harder to digest and may make you feel nauseous or bloated. Drink plenty of fluids but you should avoid alcoholic beverages for 24 hours. If you had a esophageal dilation, please see attached instructions for diet.   ° °ACTIVITY: Your care partner should take you home directly after the procedure. You should plan to take it easy, moving slowly for the rest of the day. You can resume normal activity the day after the procedure however YOU SHOULD NOT DRIVE, use power tools, machinery or perform tasks that involve climbing or major physical exertion for 24 hours (because of the sedation medicines used during the test).  ° °SYMPTOMS TO REPORT IMMEDIATELY: °A gastroenterologist can be reached at any hour. Please call [phone]  for any of the following symptoms:  °Following lower endoscopy (colonoscopy, flexible sigmoidoscopy) °Excessive amounts of blood in the stool  °Significant tenderness, worsening of abdominal pains  °Swelling of the abdomen that is new, acute  °Fever of 100° or  higher  °Following upper endoscopy (EGD, EUS, ERCP, esophageal dilation) °Vomiting of blood or coffee ground material  °New, significant abdominal pain  °New, significant chest pain or pain under the shoulder blades  °Painful or persistently difficult swallowing  °New shortness of breath  °Black, tarry-looking or red, bloody stools ° °FOLLOW UP:  °If any biopsies were taken you will be contacted by phone or by letter within the next 1-3 weeks. Call [phone]  if you have not heard about the biopsies in 3 weeks.  °Please also call with any specific questions about appointments or follow up tests. ° °

## 2017-12-30 NOTE — Anesthesia Procedure Notes (Signed)
Procedure Name: MAC Date/Time: 12/30/2017 8:12 AM Performed by: Lissa Morales, CRNA Pre-anesthesia Checklist: Patient identified, Emergency Drugs available, Suction available, Patient being monitored and Timeout performed Patient Re-evaluated:Patient Re-evaluated prior to induction Oxygen Delivery Method: Nasal cannula Placement Confirmation: positive ETCO2

## 2017-12-30 NOTE — Op Note (Addendum)
[facility] Patient Name: Diana Francis Procedure Date: 12/30/2017 MRN: [id number] Attending MD: Milus Banister , MD Date of Birth: 01/01/1981 CSN: [id number] Age: 37 Admit Type: Outpatient Procedure:                Upper EUS Indications:              2.8cm mass involving stomach ?pancreas on 11/2017                            CT scan; also present on 2017 CT scan (not                            mentioned) Providers:                Milus Banister, MD, Cleda Daub, RN, Charolette Child, Technician, Enrigue Catena, CRNA Referring MD:              Medicines:                Monitored Anesthesia Care Complications:            No immediate complications. Estimated blood loss:                            None. Estimated Blood Loss:     Estimated blood loss: none. Procedure:                Pre-Anesthesia Assessment:                           - Prior to the procedure, a History and Physical                            was performed, and patient medications and                            allergies were reviewed. The patient's tolerance of                            previous anesthesia was also reviewed. The risks                            and benefits of the procedure and the sedation                            options and risks were discussed with the patient.                            All questions were answered, and informed consent                            was obtained. Prior Anticoagulants: The patient has  taken no previous anticoagulant or antiplatelet                            agents. ASA Grade Assessment: II - A patient with                            mild systemic disease. After reviewing the risks                            and benefits, the patient was deemed in                            satisfactory condition to undergo the procedure.                           After obtaining informed consent, the endoscope  was                            passed under direct vision. Throughout the                            procedure, the patient's blood pressure, pulse, and                            oxygen saturations were monitored continuously. The                            [id number] ([id number]) Olympus Radial EUS was                            introduced through the mouth, and advanced to the                            second part of duodenum. The [id number] ([id number])                            Olympus Linear EUS was introduced through the                            mouth, and advanced to the second part of duodenum.                            The upper EUS was accomplished without difficulty.                            The patient tolerated the procedure well. Scope In: Scope Out: Findings:      ENDOSCOPIC FINDING: :      The examined esophagus was endoscopically normal.      The entire examined stomach was endoscopically normal.      The examined duodenum was endoscopically normal.      ENDOSONOGRAPHIC FINDING: :      1. Mixed solid/cystic mass in the body of pancreas (predominantly solid)       that measures 3.2cm maximally. The mass has  crisp margins and it does       not obstruct the main pancreatic duct. The mass abuts the portal vein at       the level of the confluence but does not appear to invade it. The mass       was sampled with 2 transgastric passes with a 24 guage EUS FNA needle,       color doppler. The pancreatic parenchyma is otherwise normal.      2. No peripancreatic adenopathy.      3. Gastric wall is normal. The posterior wall of the stomach directly       abuts the mass above but the mass does not appear originate from the       stomach.      4. CBD normal, non-dilated      5. Gallbladder was normal.      6. Limited views of the liver, spleen, portal and splenic vessels were       all normal. Impression:               - 3.2cm mixed (solid>cystic) mass in the body of                             the pancreas, present since at least 2017. The mass                            is very distinct with crisp margins and although it                            abuts the portal vein at the level of the                            confluence I do not think it invades the vessel.                            The main portal vein is not obstructed. Preliminary                            review of the cytology shows neoplastic cells                            (presently favoring solid pseudopapillary tumor).                            Await final results. Moderate Sedation:      Not Applicable - Patient had care per Anesthesia. Recommendation:           - Discharge patient to home.                           - Await final pathology results.                           - My office will begin referral process to                            [facility], Dr. Barry Dienes to consider  resection. Will                            also present at an upcoming multidisciplinary GI                            tumor board. Procedure Code(s):        --- Professional ---                           [phone], Esophagogastroduodenoscopy, flexible,                            transoral; with transendoscopic ultrasound-guided                            intramural or transmural fine needle                            aspiration/biopsy(s), (includes endoscopic                            ultrasound examination limited to the esophagus,                            stomach or duodenum, and adjacent structures) Diagnosis Code(s):        --- Professional ---                           K86.89, Other specified diseases of pancreas                           R93.3, Abnormal findings on diagnostic imaging of                            other parts of digestive tract CPT copyright 2018 [employer]. All rights reserved. The codes documented in this report are preliminary and upon coder review may  be revised to meet  current compliance requirements. Milus Banister, MD 12/30/2017 9:13:36 AM This report has been signed electronically. Number of Addenda: 0

## 2017-12-30 NOTE — H&P (Signed)
HPI: This is a very pleasant 37 yo woman  Chief complaint is mass involving stomach (since at least 2017)  ROS: complete GI ROS as described in HPI, all other review negative.  Constitutional:  No unintentional weight loss   Past Medical History:  Diagnosis Date  . Anemia    history of anemia  . Depression    Bipolar  . GERD (gastroesophageal reflux disease)   . Headache(784.0)   . HTN (hypertension)    no meds- per pt white coat syndrome  . Hypothyroidism     Past Surgical History:  Procedure Laterality Date  . DILATION AND CURETTAGE OF UTERUS N/A 11/24/2012   Procedure: DILATATION AND CURETTAGE;  Surgeon: Emily Filbert, MD;  Location: [facility];  Service: Gynecology;  Laterality: N/A;  . E sure    . LAPAROSCOPY N/A 11/24/2012   Procedure: LAPAROSCOPY DIAGNOSTIC;  Surgeon: Emily Filbert, MD;  Location: [facility];  Service: Gynecology;  Laterality: N/A;  . VAGINAL HYSTERECTOMY Bilateral 01/04/2013   Procedure: HYSTERECTOMY VAGINAL with Bilateral Fallopian Tubes;  Surgeon: Emily Filbert, MD;  Location: [facility];  Service: Gynecology;  Laterality: Bilateral;    Current Facility-Administered Medications  Medication Dose Route Frequency Provider Last Rate Last Dose  . 0.9 %  sodium chloride infusion   Intravenous Continuous Milus Banister, MD      . lactated ringers infusion   Intravenous Continuous Milus Banister, MD        Allergies as of 11/30/2017 - Review Complete 11/28/2017  Allergen Reaction Noted  . Bactrim [sulfamethoxazole-trimethoprim] Nausea And Vomiting 12/04/2015    Family History  Problem Relation Age of Onset  . Depression Mother   . Hypertension Mother   . Bipolar disorder Maternal Aunt   . Diabetes Maternal Aunt   . Cancer Maternal Grandfather        lymphoma  . Lung cancer Maternal Grandfather        smoker  . Hypertension Father   . Other Father        murdered  . Diabetes Maternal Uncle   . Other Paternal Grandmother        MS  . Diabetes Mellitus I  Paternal Grandfather   . Other Paternal Grandfather        Barter's diease    Social History   Socioeconomic History  . Marital status: Divorced    Spouse name: Not on file  . Number of children: 3  . Years of education: Not on file  . Highest education level: Not on file  Occupational History  . Occupation: Production designer, theatre/television/film  Social Needs  . Financial resource strain: Not on file  . Food insecurity:    Worry: Not on file    Inability: Not on file  . Transportation needs:    Medical: Not on file    Non-medical: Not on file  Tobacco Use  . Smoking status: Current Every Day Smoker    Packs/day: 0.50    Years: 20.00    Pack years: 10.00    Types: Cigarettes  . Smokeless tobacco: Never Used  . Tobacco comment: tobacco info given 11/26/17  Substance and Sexual Activity  . Alcohol use: No    Alcohol/week: 0.0 standard drinks  . Drug use: No  . Sexual activity: Not Currently    Birth control/protection: Surgical  Lifestyle  . Physical activity:    Days per week: Not on file    Minutes per session: Not on file  . Stress:  Not on file  Relationships  . Social connections:    Talks on phone: Not on file    Gets together: Not on file    Attends religious service: Not on file    Active member of club or organization: Not on file    Attends meetings of clubs or organizations: Not on file    Relationship status: Not on file  . Intimate partner violence:    Fear of current or ex partner: Not on file    Emotionally abused: Not on file    Physically abused: Not on file    Forced sexual activity: Not on file  Other Topics Concern  . Not on file  Social History Narrative  . Not on file     Physical Exam: LMP 12/19/2012  Constitutional: generally well-appearing Psychiatric: alert and oriented x3 Abdomen: soft, nontender, nondistended, no obvious ascites, no peritoneal signs, normal bowel sounds No peripheral edema noted in lower extremities  Assessment and plan: 37  y.o. female with mass involving stomach (since at least 2017)  For upper EUS evaluation with FNA today  Please see the "Patient Instructions" section for addition details about the plan.  Owens Loffler, MD [facility] 12/30/2017, 7:28 AM

## 2017-12-31 ENCOUNTER — Emergency Department (HOSPITAL_COMMUNITY)
Admission: EM | Admit: 2017-12-31 | Discharge: 2017-12-31 | Disposition: A | Discharge status: Home / Self Care | Payer: 59 | Attending: Emergency Medicine | Admitting: Emergency Medicine

## 2017-12-31 ENCOUNTER — Other Ambulatory Visit: Payer: Self-pay

## 2017-12-31 ENCOUNTER — Encounter (HOSPITAL_COMMUNITY): Payer: Self-pay | Admitting: Emergency Medicine

## 2017-12-31 ENCOUNTER — Emergency Department (HOSPITAL_COMMUNITY): Payer: 59

## 2017-12-31 ENCOUNTER — Telehealth: Payer: Self-pay | Admitting: Internal Medicine

## 2017-12-31 DIAGNOSIS — R1012 Left upper quadrant pain: Secondary | ICD-10-CM | POA: Insufficient documentation

## 2017-12-31 DIAGNOSIS — R112 Nausea with vomiting, unspecified: Secondary | ICD-10-CM | POA: Diagnosis not present

## 2017-12-31 DIAGNOSIS — I10 Essential (primary) hypertension: Secondary | ICD-10-CM | POA: Diagnosis not present

## 2017-12-31 DIAGNOSIS — Z79899 Other long term (current) drug therapy: Secondary | ICD-10-CM | POA: Diagnosis not present

## 2017-12-31 DIAGNOSIS — F17210 Nicotine dependence, cigarettes, uncomplicated: Secondary | ICD-10-CM | POA: Insufficient documentation

## 2017-12-31 DIAGNOSIS — E039 Hypothyroidism, unspecified: Secondary | ICD-10-CM | POA: Insufficient documentation

## 2017-12-31 DIAGNOSIS — R109 Unspecified abdominal pain: Secondary | ICD-10-CM | POA: Diagnosis not present

## 2017-12-31 LAB — CBC
HCT: 40.9 % (ref 36.0–46.0)
Hemoglobin: 13.4 g/dL (ref 12.0–15.0)
MCH: 32.4 pg (ref 26.0–34.0)
MCHC: 32.8 g/dL (ref 30.0–36.0)
MCV: 98.8 fL (ref 80.0–100.0)
Platelets: 235 10*3/uL (ref 150–400)
RBC: 4.14 MIL/uL (ref 3.87–5.11)
RDW: 13.8 % (ref 11.5–15.5)
WBC: 12.4 10*3/uL — ABNORMAL HIGH (ref 4.0–10.5)
nRBC: 0 % (ref 0.0–0.2)

## 2017-12-31 LAB — URINALYSIS, ROUTINE W REFLEX MICROSCOPIC
Bacteria, UA: NONE SEEN
Bilirubin Urine: NEGATIVE
Glucose, UA: NEGATIVE mg/dL
Ketones, ur: NEGATIVE mg/dL
Leukocytes, UA: NEGATIVE
Nitrite: NEGATIVE
PH: 7 (ref 5.0–8.0)
Protein, ur: NEGATIVE mg/dL
SPECIFIC GRAVITY, URINE: 1.014 (ref 1.005–1.030)

## 2017-12-31 LAB — COMPREHENSIVE METABOLIC PANEL
ALT: 9 U/L (ref 0–44)
AST: 17 U/L (ref 15–41)
Albumin: 3.7 g/dL (ref 3.5–5.0)
Alkaline Phosphatase: 62 U/L (ref 38–126)
Anion gap: 9 (ref 5–15)
BUN: <5 mg/dL — ABNORMAL LOW (ref 6–20)
CO2: 23 mmol/L (ref 22–32)
Calcium: 8.9 mg/dL (ref 8.9–10.3)
Chloride: 107 mmol/L (ref 98–111)
Creatinine, Ser: 0.63 mg/dL (ref 0.44–1.00)
GFR calc Af Amer: >60 mL/min (ref >60)
GFR calc non Af Amer: >60 mL/min (ref >60)
Glucose, Bld: 95 mg/dL (ref 70–99)
Potassium: 3.6 mmol/L (ref 3.5–5.1)
Sodium: 139 mmol/L (ref 135–145)
TOTAL PROTEIN: 7.2 g/dL (ref 6.5–8.1)
Total Bilirubin: 1.4 mg/dL — ABNORMAL HIGH (ref 0.3–1.2)

## 2017-12-31 LAB — LIPASE, BLOOD: Lipase: 23 U/L (ref 11–51)

## 2017-12-31 LAB — PREGNANCY, URINE: Preg Test, Ur: NEGATIVE

## 2017-12-31 MED ORDER — METOCLOPRAMIDE HCL 5 MG/ML IJ SOLN
10.0000 mg | Freq: Once | INTRAMUSCULAR | Status: AC
  Administered 2017-12-31: 10 mg via INTRAVENOUS
  Filled 2017-12-31: qty 2

## 2017-12-31 MED ORDER — FAMOTIDINE IN NACL 20-0.9 MG/50ML-% IV SOLN
20.0000 mg | Freq: Once | INTRAVENOUS | Status: AC
  Administered 2017-12-31: 20 mg via INTRAVENOUS
  Filled 2017-12-31: qty 50

## 2017-12-31 MED ORDER — METOCLOPRAMIDE HCL 10 MG PO TABS: 10.0000 mg | ORAL_TABLET | Freq: Three times a day (TID) | ORAL | 0 refills | Status: AC | PRN

## 2017-12-31 MED ORDER — SODIUM CHLORIDE 0.9 % IV BOLUS
1000.0000 mL | Freq: Once | INTRAVENOUS | Status: AC
  Administered 2017-12-31: 1000 mL via INTRAVENOUS

## 2017-12-31 NOTE — ED Notes (Signed)
Pt provided ginger ale.  

## 2017-12-31 NOTE — Telephone Encounter (Signed)
Overnight after EUS/FNA of pancreas mass developed abdominal pain and nause and vomiting. I advised she go to EF foe evaluation and treatment (and she has)

## 2017-12-31 NOTE — ED Provider Notes (Signed)
[facility] DEPT Provider Note   CSN: [id number] Arrival date & time: 12/31/17  0553     History   Chief Complaint Chief Complaint  Patient presents with  . Emesis    HPI Diana Francis is a 37 y.o. female with history of anemia, depression, GERD, hypertension, hypothyroidism presents for evaluation of acute onset, ongoing nausea and vomiting which began acutely at around 2 AM this morning.  Yesterday she underwent EGD with pancreatic biopsy for evaluation of a pancreatic mass noted on imaging performed on 11/23/2017 at an ED visit.  She reports that she tolerated the procedure well and did not have any pain but she did notice some swelling of the right lower lip which has been improving with the application of ice. 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  She denies any fevers, difficulty breathing or swallowing, hives, chest pain, or shortness of breath.  Since 2 AM this morning she has had 4 episodes of nonbloody nonbilious emesis and developed left upper quadrant abdominal pain which does not radiate and feels "hot".  She denies urinary symptoms, diarrhea, constipation, melena, or hematochezia.  No vaginal itching, bleeding, or discharge out of the ordinary.  She states that she had potatoes and green beans last night after her procedure.  She has not tried anything for her symptoms but did call the on-call gastroenterology line and was told to come to the ED for further evaluation.  She is a current smoker of approximately half a pack of cigarettes daily, smoked marijuana last night, denies alcohol use.  The history is provided by the patient.    Past Medical History:  Diagnosis Date  . Anemia    history of anemia  . Depression    Bipolar  . GERD (gastroesophageal reflux disease)   . Headache(784.0)   . HTN (hypertension)    no meds- per pt white coat syndrome  . Hypothyroidism     Patient Active Problem List   Diagnosis Date Noted  . Abnormal CT scan, gastrointestinal tract   . Pancreatic mass   . Depression, major, recurrent, moderate ([place]) 10/03/2015    Class: Chronic  . Depression 12/13/2012  . PTSD (post-traumatic stress disorder) 09/27/2012    Past Surgical History:  Procedure Laterality Date  . DILATION AND CURETTAGE OF UTERUS N/A 11/24/2012   Procedure: DILATATION AND CURETTAGE;  Surgeon: Emily Filbert, MD;  Location: [facility];  Service: Gynecology;  Laterality: N/A;  . E sure    . ESOPHAGOGASTRODUODENOSCOPY N/A 12/30/2017   Procedure: ESOPHAGOGASTRODUODENOSCOPY (EGD);  Surgeon: Milus Banister, MD;  Location: [facility];  Service: Endoscopy;  Laterality: N/A;  . EUS N/A 12/30/2017   Procedure: UPPER ENDOSCOPIC ULTRASOUND (EUS) RADIAL;  Surgeon: Milus Banister, MD;  Location: [facility];  Service: Endoscopy;  Laterality: N/A;  . FINE NEEDLE ASPIRATION  12/30/2017   Procedure: FINE NEEDLE ASPIRATION (FNA) RADIAL;  Surgeon: Milus Banister, MD;  Location: [facility];  Service: Endoscopy;;  . LAPAROSCOPY N/A 11/24/2012   Procedure: LAPAROSCOPY DIAGNOSTIC;  Surgeon: Emily Filbert, MD;  Location: [facility];  Service: Gynecology;  Laterality: N/A;  . VAGINAL HYSTERECTOMY Bilateral 01/04/2013   Procedure: HYSTERECTOMY VAGINAL with Bilateral Fallopian Tubes;  Surgeon: Emily Filbert, MD;  Location: [facility];  Service: Gynecology;  Laterality: Bilateral;     OB History    Gravida  3   Para  2   Term      Preterm      AB  1   Living        SAB  1   TAB      Ectopic      Multiple      Live Births               Home Medications    Prior to Admission medications   Medication Sig Start Date End Date Taking? Authorizing Provider  ibuprofen (ADVIL,MOTRIN) 200 MG tablet Take 400-800 mg by mouth 2 (two) times daily as needed for headache.   Yes [provider]  Vitamin D, Ergocalciferol, (DRISDOL) 50000 UNITS CAPS capsule Take 50,000 Units by mouth every Sunday.  03/11/13   Yes [provider]  Wheat Dextrin (BENEFIBER) POWD Take 1 Dose by mouth daily.   Yes [provider]  dicyclomine (BENTYL) 20 MG tablet Take 1 tablet (20 mg total) by mouth 2 (two) times daily. Patient not taking: Reported on 11/26/2017 11/23/17   Carlisle Cater, PA-C  famotidine (PEPCID) 20 MG tablet Take 1 tablet (20 mg total) by mouth 2 (two) times daily. Patient not taking: Reported on 11/26/2017 11/23/17   Carlisle Cater, PA-C  metoCLOPramide (REGLAN) 10 MG tablet Take 1 tablet (10 mg total) by mouth every 8 (eight) hours as needed for nausea or vomiting. 12/31/17   Granger Chui A, PA-C  ondansetron (ZOFRAN ODT) 4 MG disintegrating tablet Take 1 tablet (4 mg total) by mouth every 8 (eight) hours as needed for nausea or vomiting. Patient not taking: Reported on 11/26/2017 11/23/17   Carlisle Cater, PA-C    Family History Family History  Problem Relation Age of Onset  . Depression Mother   . Hypertension Mother   . Bipolar disorder Maternal Aunt   . Diabetes Maternal Aunt   . Cancer Maternal Grandfather        lymphoma  . Lung cancer Maternal Grandfather        smoker  . Hypertension Father   . Other Father        murdered  . Diabetes Maternal Uncle   . Other Paternal Grandmother        MS  . Diabetes Mellitus I Paternal Grandfather   . Other Paternal Grandfather        Barter's diease    Social History Social History   Tobacco Use  . Smoking status: Current Every Day Smoker    Packs/day: 0.50    Years: 20.00    Pack years: 10.00    Types: Cigarettes  . Smokeless tobacco: Never Used  . Tobacco comment: tobacco info given 11/26/17  Substance Use Topics  . Alcohol use: No    Alcohol/week: 0.0 standard drinks  . Drug use: No     Allergies   Bactrim [sulfamethoxazole-trimethoprim]   Review of Systems Review of Systems  Constitutional: Negative for chills and fever.  HENT: Positive for facial swelling.   Respiratory: Negative for shortness of  breath.   Cardiovascular: Negative for chest pain.  Gastrointestinal: Positive for abdominal pain, nausea and vomiting. Negative for blood in stool, constipation and diarrhea.  Genitourinary: Negative for dysuria, frequency, hematuria and urgency.  Musculoskeletal: Negative for back pain.  All other systems reviewed and are negative.    Physical Exam Updated Vital Signs BP (!) 136/98   Pulse 90   Temp 98 F (36.7 C) (Oral)   Resp 15   LMP 12/19/2012   SpO2 95%   Physical Exam  Constitutional: She appears well-developed and well-nourished. No distress.  HENT:  Head: Normocephalic  and atraumatic.  Right lower lip with mild swelling, superficial wounds on the inside and the outside of the lip consistent with bite wound.  No drainage, surrounding erythema, or tenderness.  Tolerating secretions without difficulty.  No abnormal phonation.  Eyes: Conjunctivae are normal. Right eye exhibits no discharge. Left eye exhibits no discharge.  Neck: No JVD present. No tracheal deviation present.  Cardiovascular: Normal rate.  Pulmonary/Chest: Effort normal.  Abdominal: Soft. Bowel sounds are normal. She exhibits no distension. There is tenderness in the epigastric area, periumbilical area, suprapubic area and left upper quadrant. There is no rigidity, no rebound, no guarding and no CVA tenderness.  Actively belching, maximally tender in the left upper quadrant per  Musculoskeletal: She exhibits no edema.  Neurological: She is alert.  Skin: Skin is warm and dry. No erythema.  Psychiatric: She has a normal mood and affect. Her behavior is normal.  Nursing note and vitals reviewed.    ED Treatments / Results  Labs (all labs ordered are listed, but only abnormal results are displayed) Labs Reviewed  COMPREHENSIVE METABOLIC PANEL - Abnormal; Notable for the following components:      Result Value   BUN <5 (*)    Total Bilirubin 1.4 (*)    All other components within normal limits  CBC -  Abnormal; Notable for the following components:   WBC 12.4 (*)    All other components within normal limits  URINALYSIS, ROUTINE W REFLEX MICROSCOPIC - Abnormal; Notable for the following components:   Color, Urine AMBER (*)    Hgb urine dipstick SMALL (*)    All other components within normal limits  LIPASE, BLOOD  PREGNANCY, URINE    EKG None  Radiology Dg Abdomen Acute W/chest  Result Date: 12/31/2017 CLINICAL DATA:  Acute abdominal pain following pancreas biopsy yesterday. EXAM: DG ABDOMEN ACUTE W/ 1V CHEST COMPARISON:  11/23/2017 FINDINGS: There is no evidence of dilated bowel loops or free intraperitoneal air. No radiopaque calculi or other significant radiographic abnormality is seen. Heart size and mediastinal contours are within normal limits. Both lungs are clear. IMPRESSION: Negative abdominal radiographs.  No acute cardiopulmonary disease. Electronically Signed   By: Margarette Canada M.D.   On: 12/31/2017 09:38    Procedures Procedures (including critical care time)  Medications Ordered in ED Medications  sodium chloride 0.9 % bolus 1,000 mL (0 mLs Intravenous Stopped 12/31/17 0737)  metoCLOPramide (REGLAN) injection 10 mg (10 mg Intravenous Given 12/31/17 0642)  famotidine (PEPCID) IVPB 20 mg premix (0 mg Intravenous Stopped 12/31/17 0738)     Initial Impression / Assessment and Plan / ED Course  I have reviewed the triage vital signs and the nursing notes.  Pertinent labs & imaging results that were available during my care of the patient were reviewed by me and considered in my medical decision making (see chart for details).     Patient presenting with nausea vomiting and upper abdominal pain beginning acutely at 2 AM this morning.  She is 1 day status post EGD with fine-needle aspiration of a pancreatic mass.  She is afebrile, vital signs are stable.  She is nontoxic in appearance.  She is actively belching on my examination.  No peritoneal signs on examination of the  abdomen.  Recently had CT scan of her abdomen for evaluation of similar pain.  Will obtain lab work and give fluids and antiemetics and reassess.  Lab work reviewed by me shows mild nonspecific leukocytosis which could be secondary to emesis, no anemia,  no metabolic derangements.  AST/ALT, lipase, creatinine within normal limits.  She was given IV fluids and Reglan with significant improvement in her nausea and pain and reports she is feeling much better.  I spoke with Tye Savoy, NP with [facility] who recommends obtaining abdominal x-ray for rule out of free air.  If negative, she recommends discharge home with follow-up with GI on an outpatient basis.  10:00AM Radiographs without acute abnormality.  Patient tolerating p.o. fluids without difficulty.  Serial abdominal examinations remain benign.  Doubt acute surgical abdominal pathology.  Patient stable for discharge home with follow-up with gastroneurology on an outpatient basis.  Will discharge with Reglan in lieu of Zofran.  Encourage patient to advance diet slowly.  Discussed strict ED return precautions. Pt verbalized understanding of and agreement with plan and is safe for discharge home at this time. Discussed with Dr. Francia Greaves who agrees with assessment and plan at this time.   Final Clinical Impressions(s) / ED Diagnoses   Final diagnoses:  Left upper quadrant pain  Non-intractable vomiting with nausea, unspecified vomiting type    ED Discharge Orders         Ordered    metoCLOPramide (REGLAN) 10 MG tablet  Every 8 hours PRN     12/31/17 1002           Sheri Gatchel, [place] A, PA-C 12/31/17 1059    Ward, [employer], DO 12/31/17 2319

## 2017-12-31 NOTE — Discharge Instructions (Addendum)
Your abdominal xray was reassuring.  1. Medications: Take [phone] mg of Tylenol every 6 hours as needed for pain. Do not exceed 4000 mg of Tylenol daily.  Take Reglan as needed for nausea.  If you continue to vomit you can try Zofran which will dissolve under your tongue.  Wait around 20 minutes before eating or drinking after taking this medication.  Do not take both of these medications together. 2. Treatment: rest, drink plenty of fluids, advance diet slowly.  Start with water and broth then advance to bland foods that will not upset your stomach such as crackers, mashed potatoes, and peanut butter. 3. Follow Up: Please followup with your gastroenterologist in 3-7 days for discussion of your diagnoses and further evaluation after today's visit; if you do not have a primary care doctor use the resource guide provided to find one; Please return to the ER for persistent vomiting, high fevers or worsening symptoms

## 2017-12-31 NOTE — ED Triage Notes (Signed)
Pt from home with c/o emesis. Pt actively retching in triage. Pt has hx of hyperemesis and has hx of thc use. Pt had pancreas biopsied yesterday

## 2018-01-03 ENCOUNTER — Telehealth: Payer: Self-pay

## 2018-01-03 NOTE — Telephone Encounter (Signed)
Loman Chroman, Marthenia Rolling, RN        I do not I am waiting for the nurse to get back with me on where we are placing the patient.   Previous Messages    ----- Message -----  From: Timothy Lasso, RN  Sent: 01/03/2018 12:12 PM EST  To: Mila Merry   Do you have an appointment scheduled for this pt?

## 2018-01-03 NOTE — Telephone Encounter (Signed)
-----   Message from Mila Merry sent at 01/03/2018 12:27 PM EST ----- I do not I am waiting for the nurse to get back with me on where we are placing the patient.  ----- Message ----- From: Timothy Lasso, RN Sent: 01/03/2018  12:12 PM EST To: Mila Merry  Do you have an appointment scheduled for this pt?

## 2018-01-31 ENCOUNTER — Other Ambulatory Visit: Payer: Self-pay | Admitting: General Surgery

## 2018-01-31 DIAGNOSIS — K8689 Other specified diseases of pancreas: Secondary | ICD-10-CM | POA: Diagnosis not present

## 2018-02-03 DIAGNOSIS — Z23 Encounter for immunization: Secondary | ICD-10-CM | POA: Diagnosis not present

## 2018-02-07 NOTE — Pre-Procedure Instructions (Signed)
Diana Francis  02/07/2018     Your procedure is scheduled on Thursday, January 16.  Report to [facility] Admitting at 11:30 AM                 Your surgery or procedure is scheduled for 1:30 PM   Call this number if you have problems the morning of surgery: [phone]  This is the number for the Pre- Surgical Desk.    Remember:  Do not eat after midnight  Wednesday, January 15.  You may drink clear liquids until 10:30 AM .  Clear liquids allowed are:   Water, Juice (non-citric and without pulp), Carbonated beverages, Clear Tea, Black Coffee only, Plain Jell-O only, Gatorade and Plain Popsicles only    Take these medicines the morning of surgery with A SIP OF WATER : None  STOP taking Aspirin, Aspirin Products (Goody Powder, Excedrin Migraine), Ibuprofen (Advil), Naproxen (Aleve), Vitamins and Herbal Products (ie Fish Oil).   Special instructions:   [facility]- Preparing For Surgery  Before surgery, you can play an important role. Because skin is not sterile, your skin needs to be as free of germs as possible. You can reduce the number of germs on your skin by washing with CHG (chlorahexidine gluconate) Soap before surgery.  CHG is an antiseptic cleaner which kills germs and bonds with the skin to continue killing germs even after washing.    Oral Hygiene is also important to reduce your risk of infection.  Remember - BRUSH YOUR TEETH THE MORNING OF SURGERY WITH YOUR REGULAR TOOTHPASTE  Please do not use if you have an allergy to CHG or antibacterial soaps. If your skin becomes reddened/irritated stop using the CHG.  Do not shave (including legs and underarms) for at least 48 hours prior to first CHG shower. It is OK to shave your face.  Please follow these instructions carefully.   1. Shower the NIGHT BEFORE SURGERY and the MORNING OF SURGERY with CHG.   2. If you chose to wash your hair, wash your hair first as usual with your normal shampoo.  3. After you  shampoo, rinse your hair and body thoroughly to remove the shampoo.  4. Use CHG as you would any other liquid soap. You can apply CHG directly to the skin and wash gently with a scrungie or a clean washcloth.   5. Apply the CHG Soap to your body ONLY FROM THE NECK DOWN.  Do not use on open wounds or open sores. Avoid contact with your eyes, ears, mouth and genitals (private parts). Wash Face and genitals (private parts)  with your normal soap.  6. Wash thoroughly, paying special attention to the area where your surgery will be performed.  7. Thoroughly rinse your body with warm water from the neck down.  8. DO NOT shower/wash with your normal soap after using and rinsing off the CHG Soap.  9. Pat yourself dry with a CLEAN TOWEL.  10. Wear CLEAN PAJAMAS to bed the night before surgery, wear comfortable clothes the morning of surgery  11. Place CLEAN SHEETS on your bed the night of your first shower and DO NOT SLEEP WITH PETS.    Day of Surgery:   Shower as instructed above.    Do not wear lotions, powders, or perfumes, or deodorant. Please wear clean clothes to the hospital/surgery center.   Remember to brush your teeth WITH YOUR REGULAR TOOTHPASTE.   Do not wear jewelry, make-up or nail  polish.  Do not wear lotions, powders, or perfumes, or deodorant.  Do not shave 48 hours prior to surgery.  Men may shave face and neck.  Do not bring valuables to the hospital.  [facility] is not responsible for any belongings or valuables.  Contacts, dentures or bridgework may not be worn into surgery.  Leave your suitcase in the car.  After surgery it may be brought to your room.  For patients admitted to the hospital, discharge time will be determined by your treatment team.  Please read over the following fact sheets that you were given. Pain Booklet, Coughing and Deep Breathing,Surgical Site Infections.

## 2018-02-07 NOTE — Pre-Procedure Instructions (Signed)
Diana Francis  02/07/2018     Your procedure is scheduled on Thursday, January 16..  Report to [facility] Admitting at 11:30 AM             Your surgery or procedure is scheduled for  1:30 PM   Call this number if you have problems the morning of surgery: [phone]  This is the number for the P   Remember:  Do not eat or drink after midnight.  You may drink clear liquids until 10:30 AM .  Clear liquids allowed are:                    Water, Juice (non-citric and without pulp), Carbonated beverages, Clear Tea, Black Coffee only, Plain Jell-O only, Gatorade and Plain Popsicles only    Take these medicines the morning of surgery with A SIP OF WATER : None   STOP taking Aspirin, Aspirin Products (Goody Powder, Excedrin Migraine), Ibuprofen (Advil), Naproxen (Aleve), Vitamins and Herbal Products (ie Fish Oil).  Special instructions:  [facility]- Preparing For Surgery  Before surgery, you can play an important role. Because skin is not sterile, your skin needs to be as free of germs as possible. You can reduce the number of germs on your skin by washing with CHG (chlorahexidine gluconate) Soap before surgery.  CHG is an antiseptic cleaner which kills germs and bonds with the skin to continue killing germs even after washing.    Oral Hygiene is also important to reduce your risk of infection.  Remember - BRUSH YOUR TEETH THE MORNING OF SURGERY WITH YOUR REGULAR TOOTHPASTE  Please do not use if you have an allergy to CHG or antibacterial soaps. If your skin becomes reddened/irritated stop using the CHG.  Do not shave (including legs and underarms) for at least 48 hours prior to first CHG shower. It is OK to shave your face.  Please follow these instructions carefully.   1. Shower the NIGHT BEFORE SURGERY and the MORNING OF SURGERY with CHG.   2. If you chose to wash your hair, wash your hair first as usual with your normal shampoo.  3. After you shampoo,wash your  face and private area with the soap you use at home, then rinse your hair and body thoroughly to remove the shampoo and soap.  4. Use CHG as you would any other liquid soap. You can apply CHG directly to the skin and wash gently with a scrungie or a clean washcloth.   5. Apply the CHG Soap to your body ONLY FROM THE NECK DOWN.  Do not use on open wounds or open sores. Avoid contact with your eyes, ears, mouth and genitals (private parts).   6. Wash thoroughly, paying special attention to the area where your surgery will be performed.  7. Thoroughly rinse your body with warm water from the neck down.  8. DO NOT shower/wash with your normal soap after using and rinsing off the CHG Soap.  9. Pat yourself dry with a CLEAN TOWEL.  10. Wear CLEAN PAJAMAS to bed the night before surgery, wear comfortable clothes the morning of surgery  11. Place CLEAN SHEETS on your bed the night of your first shower and DO NOT SLEEP WITH PETS.  Day of Surgery: Shower  As Instruction Above Do not apply any deodorants/lotions.  Please wear clean clothes to the hospital/surgery center.   Remember to brush your teeth WITH YOUR REGULAR TOOTHPASTE.   Do not wear  jewelry, make-up or nail polish.  Do not wear lotions, powders, or perfumes, or deodorant.  Do not shave 48 hours prior to surgery.  Men may shave face and neck.  Do not bring valuables to the hospital.  [facility] is not responsible for any belongings or valuables.  Contacts, dentures or bridgework may not be worn into surgery.  Leave your suitcase in the car.  After surgery it may be brought to your room.  For patients admitted to the hospital, discharge time will be determined by your treatment team.  Patients discharged the day of surgery will not be allowed to drive home.   Please read over the following fact sheets that you were given.

## 2018-02-08 ENCOUNTER — Encounter (HOSPITAL_COMMUNITY)
Admission: RE | Admit: 2018-02-08 | Discharge: 2018-02-08 | Disposition: A | Discharge status: Home / Self Care | Payer: 59 | Source: Ambulatory Visit | Attending: General Surgery | Admitting: General Surgery

## 2018-02-08 ENCOUNTER — Encounter (HOSPITAL_COMMUNITY): Payer: Self-pay

## 2018-02-08 ENCOUNTER — Other Ambulatory Visit: Payer: Self-pay

## 2018-02-08 DIAGNOSIS — Z01812 Encounter for preprocedural laboratory examination: Secondary | ICD-10-CM

## 2018-02-08 DIAGNOSIS — R197 Diarrhea, unspecified: Secondary | ICD-10-CM | POA: Diagnosis not present

## 2018-02-08 DIAGNOSIS — R1902 Left upper quadrant abdominal swelling, mass and lump: Secondary | ICD-10-CM | POA: Diagnosis not present

## 2018-02-08 DIAGNOSIS — R1012 Left upper quadrant pain: Secondary | ICD-10-CM | POA: Diagnosis not present

## 2018-02-08 HISTORY — DX: Pneumonia, unspecified organism: J18.9

## 2018-02-08 LAB — ABO/RH: ABO/RH(D): B POS

## 2018-02-08 LAB — CBC
HCT: 41.5 % (ref 36.0–46.0)
Hemoglobin: 13.5 g/dL (ref 12.0–15.0)
MCH: 32.5 pg (ref 26.0–34.0)
MCHC: 32.5 g/dL (ref 30.0–36.0)
MCV: 100 fL (ref 80.0–100.0)
Platelets: 252 10*3/uL (ref 150–400)
RBC: 4.15 MIL/uL (ref 3.87–5.11)
RDW: 14.9 % (ref 11.5–15.5)
WBC: 6.9 10*3/uL (ref 4.0–10.5)
nRBC: 0 % (ref 0.0–0.2)

## 2018-02-08 LAB — TYPE AND SCREEN
ABO/RH(D): B POS
Antibody Screen: NEGATIVE

## 2018-02-08 NOTE — Progress Notes (Signed)
PCP - Dr. Ashby Dawes  Cardiologist - no  Chest x-ray - NA  EKG - NA  Stress Test - no  ECHO - no  Cardiac Cath - no   Sleep Study - no CPAP - no  LABS-CBC, T/S ASA-no  HA1C-na Fasting Blood Sugar -  Checks Blood Sugar ___0__ times a day  Anesthesia-  Pt denies having chest pain, sob, or fever at this time. All instructions explained to the pt, with a verbal understanding of the material. Pt agrees to go over the instructions while at home for a better understanding. The opportunity to ask questions was provided.

## 2018-02-10 ENCOUNTER — Inpatient Hospital Stay (HOSPITAL_COMMUNITY): Payer: 59 | Admitting: Certified Registered Nurse Anesthetist

## 2018-02-10 ENCOUNTER — Inpatient Hospital Stay (HOSPITAL_COMMUNITY)
Admission: RE | Admit: 2018-02-10 | Discharge: 2018-02-14 | DRG: 407 | Disposition: A | Discharge status: Home / Self Care | Payer: 59 | Attending: General Surgery | Admitting: General Surgery

## 2018-02-10 ENCOUNTER — Other Ambulatory Visit: Payer: Self-pay

## 2018-02-10 ENCOUNTER — Encounter (HOSPITAL_COMMUNITY)
Admission: RE | Disposition: A | Discharge status: Home / Self Care | Payer: Self-pay | Source: Home / Self Care | Attending: General Surgery

## 2018-02-10 ENCOUNTER — Encounter (HOSPITAL_COMMUNITY): Payer: Self-pay | Admitting: Certified Registered Nurse Anesthetist

## 2018-02-10 DIAGNOSIS — C259 Malignant neoplasm of pancreas, unspecified: Secondary | ICD-10-CM | POA: Diagnosis not present

## 2018-02-10 DIAGNOSIS — D490 Neoplasm of unspecified behavior of digestive system: Secondary | ICD-10-CM | POA: Diagnosis not present

## 2018-02-10 DIAGNOSIS — F17210 Nicotine dependence, cigarettes, uncomplicated: Secondary | ICD-10-CM | POA: Diagnosis present

## 2018-02-10 DIAGNOSIS — K8689 Other specified diseases of pancreas: Secondary | ICD-10-CM | POA: Diagnosis not present

## 2018-02-10 DIAGNOSIS — Z90710 Acquired absence of both cervix and uterus: Secondary | ICD-10-CM

## 2018-02-10 DIAGNOSIS — F4310 Post-traumatic stress disorder, unspecified: Secondary | ICD-10-CM | POA: Diagnosis present

## 2018-02-10 HISTORY — PX: DISTAL PANCREATECTOMY: SHX1468

## 2018-02-10 HISTORY — PX: SPLENECTOMY, TOTAL: SHX788

## 2018-02-10 LAB — CBC
HCT: 40.2 % (ref 36.0–46.0)
Hemoglobin: 13.1 g/dL (ref 12.0–15.0)
MCH: 32.3 pg (ref 26.0–34.0)
MCHC: 32.6 g/dL (ref 30.0–36.0)
MCV: 99 fL (ref 80.0–100.0)
NRBC: 0 % (ref 0.0–0.2)
Platelets: 248 10*3/uL (ref 150–400)
RBC: 4.06 MIL/uL (ref 3.87–5.11)
RDW: 14.6 % (ref 11.5–15.5)
WBC: 18.2 10*3/uL — ABNORMAL HIGH (ref 4.0–10.5)

## 2018-02-10 LAB — CREATININE, SERUM
Creatinine, Ser: 0.82 mg/dL (ref 0.44–1.00)
GFR calc non Af Amer: >60 mL/min (ref >60)

## 2018-02-10 SURGERY — PANCREATECTOMY
Anesthesia: General | Site: Abdomen

## 2018-02-10 MED ORDER — MIDAZOLAM HCL 2 MG/2ML IJ SOLN
INTRAMUSCULAR | Status: DC | PRN
  Administered 2018-02-10: 2 mg via INTRAVENOUS

## 2018-02-10 MED ORDER — ONDANSETRON HCL 4 MG/2ML IJ SOLN
INTRAMUSCULAR | Status: AC
  Filled 2018-02-10: qty 2

## 2018-02-10 MED ORDER — CHLORHEXIDINE GLUCONATE CLOTH 2 % EX PADS: 6.0000 | MEDICATED_PAD | Freq: Once | CUTANEOUS | Status: DC

## 2018-02-10 MED ORDER — SODIUM CHLORIDE 0.9% FLUSH: 9.0000 mL | INTRAVENOUS | Status: DC | PRN

## 2018-02-10 MED ORDER — CEFAZOLIN SODIUM-DEXTROSE 1-4 GM/50ML-% IV SOLN
INTRAVENOUS | Status: AC
  Filled 2018-02-10: qty 50

## 2018-02-10 MED ORDER — OXYCODONE HCL 5 MG/5ML PO SOLN
ORAL | Status: AC
  Filled 2018-02-10: qty 5

## 2018-02-10 MED ORDER — OXYCODONE HCL 5 MG PO TABS: 5.0000 mg | ORAL_TABLET | Freq: Once | ORAL | Status: AC | PRN

## 2018-02-10 MED ORDER — ACETAMINOPHEN 325 MG PO TABS
650.0000 mg | ORAL_TABLET | Freq: Four times a day (QID) | ORAL | Status: DC | PRN
  Administered 2018-02-12 (×2): 650 mg via ORAL
  Filled 2018-02-10 (×2): qty 2

## 2018-02-10 MED ORDER — PROPOFOL 10 MG/ML IV BOLUS
INTRAVENOUS | Status: DC | PRN
  Administered 2018-02-10: 160 mg via INTRAVENOUS

## 2018-02-10 MED ORDER — METHOCARBAMOL 1000 MG/10ML IJ SOLN
500.0000 mg | Freq: Four times a day (QID) | INTRAVENOUS | Status: DC | PRN
  Filled 2018-02-10: qty 5

## 2018-02-10 MED ORDER — CEFAZOLIN SODIUM-DEXTROSE 2-4 GM/100ML-% IV SOLN
2.0000 g | Freq: Three times a day (TID) | INTRAVENOUS | Status: AC
  Administered 2018-02-10: 2 g via INTRAVENOUS
  Filled 2018-02-10: qty 100

## 2018-02-10 MED ORDER — ONDANSETRON HCL 4 MG/2ML IJ SOLN: 4.0000 mg | Freq: Once | INTRAMUSCULAR | Status: DC | PRN

## 2018-02-10 MED ORDER — FENTANYL CITRATE (PF) 100 MCG/2ML IJ SOLN
INTRAMUSCULAR | Status: DC | PRN
  Administered 2018-02-10: 50 ug via INTRAVENOUS
  Administered 2018-02-10: 100 ug via INTRAVENOUS
  Administered 2018-02-10 (×5): 50 ug via INTRAVENOUS

## 2018-02-10 MED ORDER — ROCURONIUM BROMIDE 50 MG/5ML IV SOSY
PREFILLED_SYRINGE | INTRAVENOUS | Status: DC | PRN
  Administered 2018-02-10: 20 mg via INTRAVENOUS
  Administered 2018-02-10: 50 mg via INTRAVENOUS
  Administered 2018-02-10: 20 mg via INTRAVENOUS

## 2018-02-10 MED ORDER — KCL IN DEXTROSE-NACL 20-5-0.45 MEQ/L-%-% IV SOLN
INTRAVENOUS | Status: DC
  Administered 2018-02-10 – 2018-02-14 (×6): via INTRAVENOUS
  Filled 2018-02-10 (×7): qty 1000

## 2018-02-10 MED ORDER — ACETAMINOPHEN 500 MG PO TABS
1000.0000 mg | ORAL_TABLET | ORAL | Status: AC
  Administered 2018-02-10: 1000 mg via ORAL
  Filled 2018-02-10: qty 2

## 2018-02-10 MED ORDER — KETOROLAC TROMETHAMINE 30 MG/ML IJ SOLN
30.0000 mg | Freq: Three times a day (TID) | INTRAMUSCULAR | Status: AC
  Administered 2018-02-10 – 2018-02-11 (×3): 30 mg via INTRAVENOUS
  Filled 2018-02-10 (×3): qty 1

## 2018-02-10 MED ORDER — TRAMADOL HCL 50 MG PO TABS
50.0000 mg | ORAL_TABLET | Freq: Four times a day (QID) | ORAL | Status: DC | PRN
  Administered 2018-02-11 – 2018-02-14 (×11): 50 mg via ORAL
  Filled 2018-02-10 (×12): qty 1

## 2018-02-10 MED ORDER — DIPHENHYDRAMINE HCL 12.5 MG/5ML PO ELIX: 12.5000 mg | ORAL_SOLUTION | Freq: Four times a day (QID) | ORAL | Status: DC | PRN

## 2018-02-10 MED ORDER — MIDAZOLAM HCL 2 MG/2ML IJ SOLN
INTRAMUSCULAR | Status: AC
  Filled 2018-02-10: qty 2

## 2018-02-10 MED ORDER — LACTATED RINGERS IV SOLN: INTRAVENOUS | Status: DC | PRN

## 2018-02-10 MED ORDER — FENTANYL CITRATE (PF) 100 MCG/2ML IJ SOLN
INTRAMUSCULAR | Status: AC
  Filled 2018-02-10: qty 2

## 2018-02-10 MED ORDER — LIDOCAINE 2% (20 MG/ML) 5 ML SYRINGE
INTRAMUSCULAR | Status: AC
  Filled 2018-02-10: qty 5

## 2018-02-10 MED ORDER — BUPIVACAINE-EPINEPHRINE (PF) 0.25% -1:200000 IJ SOLN
INTRAMUSCULAR | Status: AC
  Filled 2018-02-10: qty 30

## 2018-02-10 MED ORDER — KETAMINE HCL 10 MG/ML IJ SOLN
INTRAMUSCULAR | Status: DC | PRN
  Administered 2018-02-10: 25 mg via INTRAVENOUS

## 2018-02-10 MED ORDER — SODIUM CHLORIDE 0.9 % IR SOLN
Status: DC | PRN
  Administered 2018-02-10: 1000 mL

## 2018-02-10 MED ORDER — FENTANYL CITRATE (PF) 250 MCG/5ML IJ SOLN
INTRAMUSCULAR | Status: AC
  Filled 2018-02-10: qty 5

## 2018-02-10 MED ORDER — FENTANYL CITRATE (PF) 100 MCG/2ML IJ SOLN
25.0000 ug | INTRAMUSCULAR | Status: DC | PRN
  Administered 2018-02-10: 50 ug via INTRAVENOUS
  Administered 2018-02-10: 75 ug via INTRAVENOUS
  Administered 2018-02-10: 25 ug via INTRAVENOUS

## 2018-02-10 MED ORDER — EVICEL 5 ML EX KIT
PACK | CUTANEOUS | Status: DC | PRN
  Administered 2018-02-10: 5 mL

## 2018-02-10 MED ORDER — LIDOCAINE 2% (20 MG/ML) 5 ML SYRINGE: INTRAMUSCULAR | Status: DC | PRN

## 2018-02-10 MED ORDER — DEXAMETHASONE SODIUM PHOSPHATE 10 MG/ML IJ SOLN
INTRAMUSCULAR | Status: DC | PRN
  Administered 2018-02-10: 10 mg via INTRAVENOUS

## 2018-02-10 MED ORDER — KETOROLAC TROMETHAMINE 30 MG/ML IJ SOLN
INTRAMUSCULAR | Status: DC | PRN
  Administered 2018-02-10: 30 mg via INTRAVENOUS

## 2018-02-10 MED ORDER — LACTATED RINGERS IV SOLN
INTRAVENOUS | Status: DC | PRN
  Administered 2018-02-10 (×2): via INTRAVENOUS

## 2018-02-10 MED ORDER — IBUPROFEN 400 MG PO TABS: 400.0000 mg | ORAL_TABLET | Freq: Two times a day (BID) | ORAL | Status: DC | PRN

## 2018-02-10 MED ORDER — ENOXAPARIN SODIUM 40 MG/0.4ML ~~LOC~~ SOLN
40.0000 mg | SUBCUTANEOUS | Status: DC
  Administered 2018-02-11 – 2018-02-14 (×4): 40 mg via SUBCUTANEOUS
  Filled 2018-02-10 (×4): qty 0.4

## 2018-02-10 MED ORDER — NALOXONE HCL 0.4 MG/ML IJ SOLN: 0.4000 mg | INTRAMUSCULAR | Status: DC | PRN

## 2018-02-10 MED ORDER — KETOROLAC TROMETHAMINE 30 MG/ML IJ SOLN
INTRAMUSCULAR | Status: AC
  Filled 2018-02-10: qty 1

## 2018-02-10 MED ORDER — KETAMINE HCL 50 MG/5ML IJ SOSY
PREFILLED_SYRINGE | INTRAMUSCULAR | Status: AC
  Filled 2018-02-10: qty 5

## 2018-02-10 MED ORDER — LIDOCAINE HCL (PF) 1 % IJ SOLN
INTRAMUSCULAR | Status: AC
  Filled 2018-02-10: qty 30

## 2018-02-10 MED ORDER — EVICEL 5 ML EX KIT
PACK | CUTANEOUS | Status: AC
  Filled 2018-02-10: qty 1

## 2018-02-10 MED ORDER — LABETALOL HCL 5 MG/ML IV SOLN
INTRAVENOUS | Status: AC
  Filled 2018-02-10: qty 4

## 2018-02-10 MED ORDER — DIPHENHYDRAMINE HCL 50 MG/ML IJ SOLN: 12.5000 mg | Freq: Four times a day (QID) | INTRAMUSCULAR | Status: DC | PRN

## 2018-02-10 MED ORDER — ROCURONIUM BROMIDE 50 MG/5ML IV SOSY
PREFILLED_SYRINGE | INTRAVENOUS | Status: AC
  Filled 2018-02-10: qty 5

## 2018-02-10 MED ORDER — MORPHINE SULFATE 2 MG/ML IV SOLN
INTRAVENOUS | Status: DC
  Administered 2018-02-10: 22.5 mg via INTRAVENOUS
  Administered 2018-02-10: 16:00:00 via INTRAVENOUS
  Administered 2018-02-11 (×2): 1.5 mg via INTRAVENOUS
  Administered 2018-02-11: 0 mg via INTRAVENOUS
  Filled 2018-02-10: qty 60

## 2018-02-10 MED ORDER — ONDANSETRON HCL 4 MG/2ML IJ SOLN
4.0000 mg | Freq: Four times a day (QID) | INTRAMUSCULAR | Status: DC | PRN
  Administered 2018-02-13: 4 mg via INTRAVENOUS
  Filled 2018-02-10: qty 2

## 2018-02-10 MED ORDER — SENNA 8.6 MG PO TABS
1.0000 | ORAL_TABLET | Freq: Two times a day (BID) | ORAL | Status: DC
  Administered 2018-02-10 – 2018-02-14 (×8): 8.6 mg via ORAL
  Filled 2018-02-10 (×7): qty 1

## 2018-02-10 MED ORDER — PROCHLORPERAZINE MALEATE 10 MG PO TABS
10.0000 mg | ORAL_TABLET | Freq: Four times a day (QID) | ORAL | Status: DC | PRN
  Filled 2018-02-10: qty 1

## 2018-02-10 MED ORDER — ACETAMINOPHEN 650 MG RE SUPP: 650.0000 mg | Freq: Four times a day (QID) | RECTAL | Status: DC | PRN

## 2018-02-10 MED ORDER — ONDANSETRON HCL 4 MG/2ML IJ SOLN
INTRAMUSCULAR | Status: DC | PRN
  Administered 2018-02-10: 4 mg via INTRAVENOUS

## 2018-02-10 MED ORDER — PROCHLORPERAZINE EDISYLATE 10 MG/2ML IJ SOLN: 5.0000 mg | Freq: Four times a day (QID) | INTRAMUSCULAR | Status: DC | PRN

## 2018-02-10 MED ORDER — OXYCODONE HCL 5 MG/5ML PO SOLN
5.0000 mg | Freq: Once | ORAL | Status: AC | PRN
  Administered 2018-02-10: 5 mg via ORAL

## 2018-02-10 MED ORDER — KETOROLAC TROMETHAMINE 30 MG/ML IJ SOLN: 30.0000 mg | Freq: Four times a day (QID) | INTRAMUSCULAR | Status: DC | PRN

## 2018-02-10 MED ORDER — SIMETHICONE 80 MG PO CHEW
40.0000 mg | CHEWABLE_TABLET | Freq: Four times a day (QID) | ORAL | Status: DC | PRN
  Administered 2018-02-11: 40 mg via ORAL
  Filled 2018-02-10: qty 1

## 2018-02-10 MED ORDER — LIDOCAINE IN D5W 4-5 MG/ML-% IV SOLN
1.0000 mg/min | INTRAVENOUS | Status: AC
  Administered 2018-02-10: 25 ug/kg/min via INTRAVENOUS
  Filled 2018-02-10: qty 500

## 2018-02-10 MED ORDER — ZOLPIDEM TARTRATE 5 MG PO TABS: 5.0000 mg | ORAL_TABLET | Freq: Every evening | ORAL | Status: DC | PRN

## 2018-02-10 MED ORDER — GABAPENTIN 300 MG PO CAPS
300.0000 mg | ORAL_CAPSULE | Freq: Two times a day (BID) | ORAL | Status: DC
  Administered 2018-02-10 – 2018-02-14 (×8): 300 mg via ORAL
  Filled 2018-02-10 (×8): qty 1

## 2018-02-10 MED ORDER — CEFAZOLIN SODIUM-DEXTROSE 2-4 GM/100ML-% IV SOLN
2.0000 g | INTRAVENOUS | Status: DC
  Filled 2018-02-10: qty 100

## 2018-02-10 MED ORDER — LACTATED RINGERS IV SOLN
INTRAVENOUS | Status: DC | PRN
  Administered 2018-02-10: 12:00:00 via INTRAVENOUS

## 2018-02-10 MED ORDER — CEFAZOLIN SODIUM-DEXTROSE 2-3 GM-%(50ML) IV SOLR
INTRAVENOUS | Status: DC | PRN
  Administered 2018-02-10: 2 g via INTRAVENOUS

## 2018-02-10 MED ORDER — LABETALOL HCL 5 MG/ML IV SOLN
INTRAVENOUS | Status: DC | PRN
  Administered 2018-02-10 (×2): 5 mg via INTRAVENOUS

## 2018-02-10 MED ORDER — GABAPENTIN 300 MG PO CAPS
300.0000 mg | ORAL_CAPSULE | ORAL | Status: AC
  Administered 2018-02-10: 300 mg via ORAL
  Filled 2018-02-10: qty 1

## 2018-02-10 MED ORDER — DEXAMETHASONE SODIUM PHOSPHATE 10 MG/ML IJ SOLN
INTRAMUSCULAR | Status: AC
  Filled 2018-02-10: qty 1

## 2018-02-10 MED ORDER — OXYCODONE HCL 5 MG PO TABS: 5.0000 mg | ORAL_TABLET | ORAL | Status: DC | PRN

## 2018-02-10 MED ORDER — SUGAMMADEX SODIUM 200 MG/2ML IV SOLN
INTRAVENOUS | Status: DC | PRN
  Administered 2018-02-10: 200 mg via INTRAVENOUS

## 2018-02-10 MED ORDER — LIDOCAINE 2% (20 MG/ML) 5 ML SYRINGE
INTRAMUSCULAR | Status: DC | PRN
  Administered 2018-02-10: 40 mg via INTRAVENOUS

## 2018-02-10 MED ORDER — 0.9 % SODIUM CHLORIDE (POUR BTL) OPTIME
TOPICAL | Status: DC | PRN
  Administered 2018-02-10: 1000 mL

## 2018-02-10 SURGICAL SUPPLY — 109 items
APPLIER CLIP 5 13 M/L LIGAMAX5 (MISCELLANEOUS) ×6
BAG BILE T-TUBES STRL (MISCELLANEOUS) ×2 IMPLANT
BLADE CLIPPER SURG (BLADE) IMPLANT
BLADE SURG SZ11 CARB STEEL (BLADE) ×2 IMPLANT
CANISTER SUCT 3000ML PPV (MISCELLANEOUS) ×3 IMPLANT
CATH KIT ON Q 5IN DUAL SLV (PAIN MANAGEMENT) IMPLANT
CATH KIT ON Q 7.5IN SLV (PAIN MANAGEMENT) IMPLANT
CATH ROBINSON RED A/P 14FR (CATHETERS) IMPLANT
CATH ROBINSON RED A/P 16FR (CATHETERS) IMPLANT
CHLORAPREP W/TINT 26ML (MISCELLANEOUS) ×3 IMPLANT
CLIP APPLIE 5 13 M/L LIGAMAX5 (MISCELLANEOUS) ×1 IMPLANT
CLIP VESOCCLUDE LG 6/CT (CLIP) IMPLANT
CLIP VESOCCLUDE MED 24/CT (CLIP) IMPLANT
CLIP VESOCCLUDE SM WIDE 24/CT (CLIP) IMPLANT
CLIP VESOLOCK LG 6/CT PURPLE (CLIP) IMPLANT
CLIP VESOLOCK MED 6/CT (CLIP) IMPLANT
CLIP VESOLOCK MED LG 6/CT (CLIP) IMPLANT
COVER SURGICAL LIGHT HANDLE (MISCELLANEOUS) ×3 IMPLANT
COVER WAND RF STERILE (DRAPES) ×3 IMPLANT
DRAIN CHANNEL 19F RND (DRAIN) IMPLANT
DRAIN PENROSE 1/2X36 STERILE (WOUND CARE) IMPLANT
DRAPE LAPAROSCOPIC ABDOMINAL (DRAPES) ×3 IMPLANT
DRAPE UTILITY XL STRL (DRAPES) ×6 IMPLANT
DRAPE WARM FLUID 44X44 (DRAPE) ×3 IMPLANT
DRSG COVADERM 4X10 (GAUZE/BANDAGES/DRESSINGS) IMPLANT
DRSG COVADERM 4X14 (GAUZE/BANDAGES/DRESSINGS) IMPLANT
DRSG TEGADERM 4X4.75 (GAUZE/BANDAGES/DRESSINGS) ×2 IMPLANT
ELECT BLADE 6.5 EXT (BLADE) IMPLANT
ELECT CAUTERY BLADE 6.4 (BLADE) ×3 IMPLANT
ELECT REM PT RETURN 9FT ADLT (ELECTROSURGICAL) ×3
ELECTRODE REM PT RTRN 9FT ADLT (ELECTROSURGICAL) ×1 IMPLANT
EVACUATOR SILICONE 100CC (DRAIN) IMPLANT
GAUZE SPONGE 4X4 12PLY STRL (GAUZE/BANDAGES/DRESSINGS) ×6 IMPLANT
GEL PDS (MISCELLANEOUS) IMPLANT
GLOVE BIO SURGEON STRL SZ 6 (GLOVE) ×3 IMPLANT
GLOVE INDICATOR 6.5 STRL GRN (GLOVE) ×3 IMPLANT
GOWN STRL REUS W/ TWL LRG LVL3 (GOWN DISPOSABLE) ×2 IMPLANT
GOWN STRL REUS W/TWL 2XL LVL3 (GOWN DISPOSABLE) ×6 IMPLANT
GOWN STRL REUS W/TWL LRG LVL3 (GOWN DISPOSABLE) ×4
HEMOSTAT SURGICEL 2X14 (HEMOSTASIS) IMPLANT
KIT BASIN OR (CUSTOM PROCEDURE TRAY) ×3 IMPLANT
KIT TURNOVER KIT B (KITS) ×3 IMPLANT
L-HOOK LAP DISP 36CM (ELECTROSURGICAL) ×3
LHOOK LAP DISP 36CM (ELECTROSURGICAL) ×1 IMPLANT
NS IRRIG 1000ML POUR BTL (IV SOLUTION) ×6 IMPLANT
PACK GENERAL/GYN (CUSTOM PROCEDURE TRAY) ×3 IMPLANT
PACK LAPAROSCOPIC ABD 0248 (SET/KITS/TRAYS/PACK) ×3 IMPLANT
PAD ARMBOARD 7.5X6 YLW CONV (MISCELLANEOUS) ×6 IMPLANT
PENCIL BUTTON HOLSTER BLD 10FT (ELECTRODE) ×6 IMPLANT
PENCIL SMOKE EVACUATOR (MISCELLANEOUS) ×3 IMPLANT
POUCH LAPAROSCOPIC INSTRUMENT (MISCELLANEOUS) ×3 IMPLANT
RELOAD 45 VASCULAR/THIN (ENDOMECHANICALS) IMPLANT
RELOAD STAPLE 45 2.5 WHT GRN (ENDOMECHANICALS) IMPLANT
RELOAD STAPLE 60 2.6 WHT THN (STAPLE) IMPLANT
RELOAD STAPLE 60 BLK VRY/THCK (STAPLE) IMPLANT
RELOAD STAPLER 60MM BLK (STAPLE) ×2 IMPLANT
RELOAD STAPLER WHITE 60MM (STAPLE) IMPLANT
SCISSORS HARMONIC WAVE 18CM (INSTRUMENTS) IMPLANT
SCISSORS LAP 5X35 DISP (ENDOMECHANICALS) ×3 IMPLANT
SET IRRIG TUBING LAPAROSCOPIC (IRRIGATION / IRRIGATOR) ×3 IMPLANT
SET TUBE SMOKE EVAC HIGH FLOW (TUBING) ×3 IMPLANT
SHEARS HARMONIC ACE PLUS 36CM (ENDOMECHANICALS) ×3 IMPLANT
SLEEVE ENDOPATH XCEL 5M (ENDOMECHANICALS) ×6 IMPLANT
SLEEVE SURGEON STRL (DRAPES) ×3 IMPLANT
SPECIMEN JAR X LARGE (MISCELLANEOUS) ×3 IMPLANT
SPONGE GAUZE 2X2 8PLY STER LF (GAUZE/BANDAGES/DRESSINGS) ×1
SPONGE GAUZE 2X2 8PLY STRL LF (GAUZE/BANDAGES/DRESSINGS) ×1 IMPLANT
SPONGE INTESTINAL PEANUT (DISPOSABLE) IMPLANT
SPONGE LAP 18X18 X RAY DECT (DISPOSABLE) IMPLANT
STAPLE ECHEON FLEX 60 POW ENDO (STAPLE) ×3 IMPLANT
STAPLER RELOAD 60MM BLK (STAPLE) ×6
STAPLER RELOAD WHITE 60MM (STAPLE)
STAPLER VISISTAT 35W (STAPLE) ×3 IMPLANT
STRIP PERI DRY VERITAS 60 (STAPLE) IMPLANT
SUCTION POOLE TIP (SUCTIONS) IMPLANT
SUT ETHILON 2 0 FS 18 (SUTURE) ×2 IMPLANT
SUT MNCRL AB 4-0 PS2 18 (SUTURE) IMPLANT
SUT NOVA 1 T20/GS 25DT (SUTURE) IMPLANT
SUT PDS AB 1 TP1 96 (SUTURE) IMPLANT
SUT PDS AB 3-0 SH 27 (SUTURE) IMPLANT
SUT PDS II 0 TP-1 LOOPED 60 (SUTURE) ×10 IMPLANT
SUT PROLENE 3 0 SH 48 (SUTURE) IMPLANT
SUT PROLENE 4 0 RB 1 (SUTURE)
SUT PROLENE 4 0 SH DA (SUTURE) IMPLANT
SUT PROLENE 4-0 RB1 .5 CRCL 36 (SUTURE) IMPLANT
SUT PROLENE 5 0 C1 (SUTURE) IMPLANT
SUT SILK 2 0 REEL (SUTURE) IMPLANT
SUT SILK 2 0 SH CR/8 (SUTURE) ×6 IMPLANT
SUT SILK 2 0 TIES 10X30 (SUTURE) ×3 IMPLANT
SUT SILK 3 0 SH CR/8 (SUTURE) ×3 IMPLANT
SUT SILK 3 0 TIES 10X30 (SUTURE) ×3 IMPLANT
SUT VIC AB 3-0 SH 27 (SUTURE) ×2
SUT VIC AB 3-0 SH 27X BRD (SUTURE) IMPLANT
SUT VICRYL 0 UR6 27IN ABS (SUTURE) ×2 IMPLANT
SYS LAPSCP GELPORT 120MM (MISCELLANEOUS)
SYSTEM LAPSCP GELPORT 120MM (MISCELLANEOUS) IMPLANT
TIP RIGID 35CM EVICEL (HEMOSTASIS) ×3 IMPLANT
TOWEL OR 17X24 6PK STRL BLUE (TOWEL DISPOSABLE) ×3 IMPLANT
TOWEL OR 17X26 10 PK STRL BLUE (TOWEL DISPOSABLE) ×3 IMPLANT
TRAY FOLEY MTR SLVR 14FR STAT (SET/KITS/TRAYS/PACK) ×3 IMPLANT
TROCAR XCEL 12X100 BLDLESS (ENDOMECHANICALS) ×3 IMPLANT
TROCAR XCEL BLUNT TIP 100MML (ENDOMECHANICALS) IMPLANT
TROCAR XCEL NON-BLD 5MMX100MML (ENDOMECHANICALS) ×3 IMPLANT
TUBE CONNECTING 12'X1/4 (SUCTIONS) ×1
TUBE CONNECTING 12X1/4 (SUCTIONS) ×2 IMPLANT
TUBING INSUF HEATED (TUBING) ×3 IMPLANT
TUNNELER SHEATH ON-Q 11GX8 DSP (PAIN MANAGEMENT) ×3 IMPLANT
WATER STERILE IRR 1000ML POUR (IV SOLUTION) ×3 IMPLANT
YANKAUER SUCT BULB TIP NO VENT (SUCTIONS) ×3 IMPLANT

## 2018-02-10 NOTE — Transfer of Care (Signed)
Immediate Anesthesia Transfer of Care Note  Patient: Diana Francis  Procedure(s) Performed: HAND ASSISTED DISTAL PANCREATECTOMY ERAS PATHWAY (N/A Abdomen) POSSIBLE SPLENECTOMY (N/A Abdomen)  Patient Location: PACU  Anesthesia Type:General  Level of Consciousness: awake, alert  and oriented  Airway & Oxygen Therapy: Patient Spontanous Breathing and Patient connected to face mask oxygen  Post-op Assessment: Report given to RN and Post -op Vital signs reviewed and stable  Post vital signs: Reviewed and stable  Last Vitals:  Vitals Value Taken Time  BP 130/84 02/10/2018  2:42 PM  Temp    Pulse 80 02/10/2018  2:43 PM  Resp 18 02/10/2018  2:43 PM  SpO2 100 % 02/10/2018  2:43 PM  Vitals shown include unvalidated device data.  Last Pain:  Vitals:   02/10/18 1117  PainSc: 0-No pain      Patients Stated Pain Goal: 1 (84/[id number])  Complications: No apparent anesthesia complications

## 2018-02-10 NOTE — Interval H&P Note (Signed)
History and Physical Interval Note:  02/10/2018 11:15 AM  Diana Francis  has presented today for surgery, with the diagnosis of pancreatic mass  The various methods of treatment have been discussed with the patient and family. After consideration of risks, benefits and other options for treatment, the patient has consented to  Procedure(s): HAND ASSISTED DISTAL PANCREATECTOMY ERAS PATHWAY (N/A) POSSIBLE SPLENECTOMY (N/A) as a surgical intervention .  The patient's history has been reviewed, patient examined, no change in status, stable for surgery.  I have reviewed the patient's chart and labs.  Questions were answered to the patient's satisfaction.     Stark Klein

## 2018-02-10 NOTE — Plan of Care (Signed)
  Problem: Clinical Measurements: Goal: Postoperative complications will be avoided or minimized Outcome: Progressing   Problem: Skin Integrity: Goal: Demonstration of wound healing without infection will improve Outcome: Progressing   Problem: Education: Goal: Knowledge of General Education information will improve Description Including pain rating scale, medication(s)/side effects and non-pharmacologic comfort measures Outcome: Progressing   Problem: Health Behavior/Discharge Planning: Goal: Ability to manage health-related needs will improve Outcome: Progressing   Problem: Clinical Measurements: Goal: Ability to maintain clinical measurements within normal limits will improve Outcome: Progressing Goal: Will remain free from infection Outcome: Progressing Goal: Diagnostic test results will improve Outcome: Progressing   Problem: Activity: Goal: Risk for activity intolerance will decrease Outcome: Progressing   Problem: Nutrition: Goal: Adequate nutrition will be maintained Outcome: Progressing   Problem: Coping: Goal: Level of anxiety will decrease Outcome: Progressing   Problem: Elimination: Goal: Will not experience complications related to bowel motility Outcome: Progressing Goal: Will not experience complications related to urinary retention Outcome: Progressing   Problem: Pain Managment: Goal: General experience of comfort will improve Outcome: Progressing   Problem: Safety: Goal: Ability to remain free from injury will improve Outcome: Progressing   Problem: Skin Integrity: Goal: Risk for impaired skin integrity will decrease Outcome: Progressing

## 2018-02-10 NOTE — Anesthesia Preprocedure Evaluation (Addendum)
Anesthesia Evaluation  Patient identified by MRN, date of birth, ID band Patient awake    Reviewed: Allergy & Precautions, NPO status , Patient's Chart, lab work & pertinent test results  Airway Mallampati: I  TM Distance: >3 FB Neck ROM: Full    Dental  (+) Teeth Intact, Dental Advisory Given   Pulmonary Current Smoker,    breath sounds clear to auscultation       Cardiovascular hypertension,  Rhythm:Regular Rate:Normal     Neuro/Psych    GI/Hepatic   Endo/Other    Renal/GU      Musculoskeletal   Abdominal   Peds  Hematology   Anesthesia Other Findings   Reproductive/Obstetrics                            Anesthesia Physical Anesthesia Plan  ASA: II  Anesthesia Plan: General   Post-op Pain Management:    Induction: Intravenous  PONV Risk Score and Plan: Ondansetron, Dexamethasone and Midazolam  Airway Management Planned: Oral ETT  Additional Equipment:   Intra-op Plan:   Post-operative Plan: Extubation in OR  Informed Consent: I have reviewed the patients History and Physical, chart, labs and discussed the procedure including the risks, benefits and alternatives for the proposed anesthesia with the patient or authorized representative who has indicated his/her understanding and acceptance.     Dental advisory given  Plan Discussed with: CRNA and Anesthesiologist  Anesthesia Plan Comments:         Anesthesia Quick Evaluation

## 2018-02-10 NOTE — Op Note (Signed)
PRE-OPERATIVE DIAGNOSIS: pancreatic mass  POST-OPERATIVE DIAGNOSIS:  Same  PROCEDURE:  Procedure(s): Hand assisted laparoscopic enucleation of pancreatic mass  SURGEON:  Surgeon(s): Stark Klein, MD  ASSISTANT: Nedra Hai, MD  ANESTHESIA:   local and general  DRAINS: (39 Fr) Blake drain(s) in the left abdomen   LOCAL MEDICATIONS USED:  BUPIVICAINE  and LIDOCAINE   SPECIMEN:  Source of Specimen:  pancreatic mass  DISPOSITION OF SPECIMEN:  PATHOLOGY  COUNTS:  YES  DICTATION: .Dragon Dictation  PLAN OF CARE: Admit to inpatient   PATIENT DISPOSITION:  PACU - hemodynamically stable.  FINDINGS:  Exophytic mass coming from inferior border of pancreas just lateral to inferior mesenteric vein  EBL: 50 mL  PROCEDURE:  Patient was identified in the holding area and taken the operating room where she was placed supine on operating room table.  General anesthesia was induced.  A Foley catheter was placed as well has sequential compression devices.  The patient was then placed into the leaning spleen position with appropriate padding.  The patient's abdomen was prepped and draped in sterile fashion.  A timeout was performed according to the surgical safety checklist.  All was correct, we continued.  An upper midline incision was made with a #10 blade.  The subcutaneous tissues were divided with cautery.  The fascia was entered in the midline.  The hand port was placed in this location.  A 5 mm trocar was placed into the camera port and the abdomen was insufflated to a pressure of 15 mmHg.  3 ports were then placed in the central area and left upper quadrant.  The patient was quite thin.  The only upon entering the abdomen, the left kidney was visible in the colon was retracting downward.  The lesser sac was opened.  The pancreatic mass was immediately visible.  This is quite soft and noting to come off the inferior border of the pancreas.  There was a significant amount of  healthy-appearing pancreas distal to the mass.  Because of the exophytic nature of the mass and the indolent nature of the mass, decision was made to take the mass off the inferior border of the pancreas rather than to resect the distal pancreas and create a pancreatic gastrostomy.  Overlying vessels were clipped and divided with the cautery.  The harmonic scalpel was used to take down some of the other adjacent tissues.  Care was taken to avoid the inferior mesenteric vein.  The mass was quite discrete and was taken off with the harmonic scalpel as well as a blue load of the Echelon stapler.  This was passed off for frozen section.  The pancreas had a small area of oozing but nothing bleeding.  A lap was placed into the abdomen and pressure was held for 5 minutes at the inferior border of the pancreas.  This controlled the oozing.  Upon reexamination this area was hemostatic.  The frozen section returned as low-grade pancreatic neoplasm.  The 52 French drain was placed through the GelPort and pulled out the left most trocar site.  This was secured with a 2-0 nylon.  Evicel hemostatic agent was placed over the site of the resection.    The gel port and the trocars were removed.  The fascia was closed with running 0-0 looped PDS suture x 2.  The skin was then closed with 3-0 vicryl deep dermal sutures and 4-0 monocryl running subcuticular sutures.  The wounds were then cleaned, dried, and dressed with soft dressings.  The  patient was allowed to emerge from anesthesia and was taken to the PACU in stable condition.  Needle, sponge, and instrument counts were correct x 2.

## 2018-02-10 NOTE — H&P (Signed)
Diana Francis Documented: 01/31/2018 2:26 PM Location: [facility] Patient #: [id number] DOB: 24-May-1980 Undefined / Language: Undefined / Race: Black or African American Female   History of Present Illness [name]; 01/31/2018 3:17 PM) The patient is a 38 year old female who presents with an abdominal mass. Pt is a 38 yo F who is referred for consultation by Dr. Ardis Hughs for a diagnosis of a pancreatic mass, presumably a solid pseudopapillary tumor. She presented to the ED wtih pain and nausea 11/23/2017. A Ct showed a 2.8 cm mass that was either in the posterior stomach or pancreaus. It was present on a scan in 2017 but was not discussed in the report. It appears less distinct at that point. She has continued to have pain. She had an EUS by Dr. Ardis Hughs 12/5. cytology from FNA showed neoplastic cells. The patient has continued to have pain. She denies weight loss. She has no family history of cancer. She has been out of work due to the discomfort. She has not had fever/chills/diarrhea. nausea/vomiting has resolved.   CT abd/pelvis 11/23/2017 IMPRESSION: 1. No acute findings identified within the abdomen or pelvis. The appendix is not visualized on this exam however there are no secondary signs of acute appendicitis. 2. Small volume of free fluid noted within the pelvis. 3. There is a nodule/mass within the left upper quadrant of the abdomen. This appears closely associated with the posterior wall of pelvis and tail of pancreas. When compared with exam from 12/04/2015 this demonstrates mild increase in size in the interval. If arising from the stomach this may represent a slow growing GIST tumor. Alternatively, if arising from the pancreas, this may represent a slow growing, indolent neoplasm such as an islet cell tumor. Advise further evaluation with nonemergent GI consultation.  EUS Dr. Ardis Hughs 12/30/2017 1. Mixed solid/cystic mass in the body of pancreas  (predominantly solid) that measures 3.2cm maximally. The mass has crisp margins and it does not obstruct the main pancreatic duct. The mass abuts the portal vein at the level of the confluence but does not appear to invade it. The mass was sampled with 2 transgastric passes with a 70 guage EUS FNA needle, color doppler. The pancreatic parenchyma is otherwise normal. 2. No peripancreatic adenopathy. 3. Gastric wall is normal. The posterior wall of the stomach directly abuts the mass above but the mass does not appear originate from the stomach. 4. CBD normal, non-dilated 5. Gallbladder was normal. 6. Limited views of the liver, spleen, portal and splenic vessels were all normal.  cytology 12/30/2017 Adequacy Reason Satisfactory For Evaluation. Diagnosis FINE NEEDLE ASPIRATION, ENDOSCOPIC PANCREAS, BODY (SPECIMEN 1 OF 1 COLLECTED 12/30/17) NEOPLASTIC CELLS PRESENT, SEE COMMENT.   Past Surgical History (Diana Francis, [place]; 01/31/2018 2:26 PM) Hysterectomy (not due to cancer) - Partial   Diagnostic Studies History (Diana Francis, [place]; 01/31/2018 2:26 PM) Colonoscopy  never Mammogram  never Pap Smear  1-5 years ago  Allergies (Diana Francis, CMA; 01/31/2018 2:27 PM) Bactrim *ANTI-INFECTIVE AGENTS - MISC.*  Nausea, Vomiting.  Medication History (Diana Francis, CMA; 01/31/2018 2:28 PM) Vitamin D (1000UNIT Tablet, Oral) Active. Fiber (Oral) Specific strength unknown - Active. Medications Reconciled  Social History (Diana Francis, CMA; 01/31/2018 2:26 PM) Caffeine use  Tea. No alcohol use  No drug use  Tobacco use  Current every day smoker.  Family History (Diana Francis, [place]; 01/31/2018 2:26 PM) Hypertension  Father, Mother. Migraine Headache  Mother. Thyroid problems  Mother.  Pregnancy / Birth History (Diana Francis, [place]; 01/31/2018  2:26 PM) Age at menarche  49 years. Gravida  3 Irregular periods  Maternal age  16-20 Para  2  Other Problems (Diana Francis, CMA; 01/31/2018  2:26 PM) No pertinent past medical history     Review of Systems (Diana Francis CMA; 01/31/2018 2:26 PM) Skin Not Present- Change in Wart/Mole, Dryness, Hives, Jaundice, New Lesions, Non-Healing Wounds, Rash and Ulcer. HEENT Present- Seasonal Allergies. Not Present- Earache, Hearing Loss, Hoarseness, Nose Bleed, Oral Ulcers, Ringing in the Ears, Sinus Pain, Sore Throat, Visual Disturbances, Wears glasses/contact lenses and Yellow Eyes. Respiratory Not Present- Bloody sputum, Chronic Cough, Difficulty Breathing, Snoring and Wheezing. Breast Not Present- Breast Mass, Breast Pain, Nipple Discharge and Skin Changes. Cardiovascular Not Present- Chest Pain, Difficulty Breathing Lying Down, Leg Cramps, Palpitations, Rapid Heart Rate, Shortness of Breath and Swelling of Extremities. Gastrointestinal Present- Abdominal Pain, Change in Bowel Habits, Chronic diarrhea and Constipation. Not Present- Bloating, Bloody Stool, Difficulty Swallowing, Excessive gas, Gets full quickly at meals, Hemorrhoids, Indigestion, Nausea, Rectal Pain and Vomiting. Female Genitourinary Not Present- Frequency, Nocturia, Painful Urination, Pelvic Pain and Urgency. Musculoskeletal Not Present- Back Pain, Joint Pain, Joint Stiffness, Muscle Pain, Muscle Weakness and Swelling of Extremities. Neurological Not Present- Decreased Memory, Fainting, Headaches, Numbness, Seizures, Tingling, Tremor, Trouble walking and Weakness. Psychiatric Not Present- Anxiety, Bipolar, Change in Sleep Pattern, Depression, Fearful and Frequent crying. Endocrine Not Present- Cold Intolerance, Excessive Hunger, Hair Changes, Heat Intolerance, Hot flashes and New Diabetes. Hematology Not Present- Blood Thinners, Easy Bruising, Excessive bleeding, Gland problems, HIV and Persistent Infections.  Vitals (Diana Francis CMA; 01/31/2018 2:29 PM) 01/31/2018 2:28 PM Weight: 126.13 lb Height: 65in Body Surface Area: 1.63 m Body Mass Index: 20.99 kg/m  Temp.:  98.65F(Oral)  Pulse: 88 (Regular)  BP: 130/70 (Sitting, Left Arm, Standard)       Physical Exam [name]; 01/31/2018 3:18 PM) General Mental Status-Alert. General Appearance-Consistent with stated age. Hydration-Well hydrated. Voice-Normal.  Head and Neck Head-normocephalic, atraumatic with no lesions or palpable masses. Trachea-midline. Thyroid Gland Characteristics - normal size and consistency.  Eye Eyeball - Bilateral-Extraocular movements intact. Sclera/Conjunctiva - Bilateral-No scleral icterus.  Chest and Lung Exam Chest and lung exam reveals -quiet, even and easy respiratory effort with no use of accessory muscles and on auscultation, normal breath sounds, no adventitious sounds and normal vocal resonance. Inspection Chest Wall - Normal. Back - normal.  Cardiovascular Cardiovascular examination reveals -normal heart sounds, regular rate and rhythm with no murmurs and normal pedal pulses bilaterally.  Abdomen Inspection Inspection of the abdomen reveals - No Hernias. Palpation/Percussion Palpation and Percussion of the abdomen reveal - Soft, No Rebound tenderness, No Rigidity (guarding) and No hepatosplenomegaly. Note: sl tender epigastrium and LUQ. Auscultation Auscultation of the abdomen reveals - Bowel sounds normal.  Neurologic Neurologic evaluation reveals -alert and oriented x 3 with no impairment of recent or remote memory. Mental Status-Normal.  Musculoskeletal Global Assessment -Note: no gross deformities.  Normal Exam - Left-Upper Extremity Strength Normal and Lower Extremity Strength Normal. Normal Exam - Right-Upper Extremity Strength Normal and Lower Extremity Strength Normal.  Lymphatic Head & Neck  General Head & Neck Lymphatics: Bilateral - Description - Normal. Axillary  General Axillary Region: Bilateral - Description - Normal. Tenderness - Non Tender. Femoral & Inguinal  Generalized  Femoral & Inguinal Lymphatics: Bilateral - Description - No Generalized lymphadenopathy.    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  Will plan hand assisted distal pancreatectomy with possible  splenectomy. The patient will need vaccines. If there is quite a bit of distal pancreas, will reimplant distal pancreas into the stomach wall. However, if it is diminuative in size, will just resect.  discussed surgery and risks with patient, mother, and grandmother. Reviewed risks of bleeding, infection, damage to adjacent structures, heart or lung problems, blood clot, death.  Pt has been out of work due to the pain. Will plan at the first available opportunity. Current Plans Pt Education - FB pancreatectomy You are being scheduled for surgery- Our schedulers will call you.  You should hear from our office's scheduling department within 5 working days about the location, date, and time of surgery. We try to make accommodations for patient's preferences in scheduling surgery, but sometimes the OR schedule or the surgeon's schedule prevents [place] from making those accommodations.  If you have not heard from our office [phone]) in 5 working days, call the office and ask for your surgeon's nurse.  If you have other questions about your diagnosis, plan, or surgery, call the office and ask for your surgeon's nurse.    Signed by Stark Klein, MD (01/31/2018 3:21 PM)

## 2018-02-10 NOTE — Anesthesia Postprocedure Evaluation (Signed)
Anesthesia Post Note  Patient: Kerman Passey  Procedure(s) Performed: HAND ASSISTED DISTAL PANCREATECTOMY ERAS PATHWAY (N/A Abdomen) POSSIBLE SPLENECTOMY (N/A Abdomen)     Patient location during evaluation: PACU Anesthesia Type: General Level of consciousness: sedated Pain management: pain level controlled Vital Signs Assessment: post-procedure vital signs reviewed and stable Respiratory status: spontaneous breathing and respiratory function stable Cardiovascular status: stable Postop Assessment: no apparent nausea or vomiting Anesthetic complications: no    Last Vitals:  Vitals:   02/10/18 1555 02/10/18 1637  BP: (!) 147/97 (!) 129/98  Pulse: 65 86  Resp: 18 18  Temp: 37.2 C 37 C  SpO2: 100% 100%    Last Pain:  Vitals:   02/10/18 1637  TempSrc: Oral  PainSc:                  Ressie Slevin DANIEL

## 2018-02-10 NOTE — Anesthesia Procedure Notes (Signed)
Procedure Name: Intubation Date/Time: 02/10/2018 12:01 PM Performed by: Genelle Bal, CRNA Pre-anesthesia Checklist: Patient identified, Suction available, Emergency Drugs available and Patient being monitored Patient Re-evaluated:Patient Re-evaluated prior to induction Oxygen Delivery Method: Circle system utilized Preoxygenation: Pre-oxygenation with 100% oxygen Induction Type: IV induction Ventilation: Mask ventilation without difficulty and Oral airway inserted - appropriate to patient size Laryngoscope Size: Mac and 3 Grade View: Grade I Tube type: Oral Tube size: 7.0 mm Number of attempts: 1 Airway Equipment and Method: Stylet Placement Confirmation: ETT inserted through vocal cords under direct vision,  positive ETCO2 and breath sounds checked- equal and bilateral Secured at: 21 cm Tube secured with: Tape Dental Injury: Teeth and Oropharynx as per pre-operative assessment  Comments: Intubation performed by Gwenette Greet paramedic.

## 2018-02-11 ENCOUNTER — Encounter (HOSPITAL_COMMUNITY): Payer: Self-pay | Admitting: General Surgery

## 2018-02-11 LAB — CBC
HCT: 36.8 % (ref 36.0–46.0)
Hemoglobin: 12.4 g/dL (ref 12.0–15.0)
MCH: 33.4 pg (ref 26.0–34.0)
MCHC: 33.7 g/dL (ref 30.0–36.0)
MCV: 99.2 fL (ref 80.0–100.0)
PLATELETS: 245 10*3/uL (ref 150–400)
RBC: 3.71 MIL/uL — AB (ref 3.87–5.11)
RDW: 14.7 % (ref 11.5–15.5)
WBC: 22.4 10*3/uL — ABNORMAL HIGH (ref 4.0–10.5)
nRBC: 0 % (ref 0.0–0.2)

## 2018-02-11 LAB — BASIC METABOLIC PANEL
Anion gap: 10 (ref 5–15)
BUN: <5 mg/dL — ABNORMAL LOW (ref 6–20)
CO2: 22 mmol/L (ref 22–32)
Calcium: 8.9 mg/dL (ref 8.9–10.3)
Chloride: 104 mmol/L (ref 98–111)
Creatinine, Ser: 0.91 mg/dL (ref 0.44–1.00)
Glucose, Bld: 105 mg/dL — ABNORMAL HIGH (ref 70–99)
Potassium: 3.8 mmol/L (ref 3.5–5.1)
Sodium: 136 mmol/L (ref 135–145)

## 2018-02-11 MED ORDER — MORPHINE SULFATE (PF) 2 MG/ML IV SOLN
1.0000 mg | INTRAVENOUS | Status: DC | PRN
  Administered 2018-02-14: 2 mg via INTRAVENOUS
  Filled 2018-02-11: qty 1

## 2018-02-11 NOTE — Progress Notes (Signed)
1 Day Post-Op   Subjective/Chief Complaint: Doing well.  Sore, but mobilizing.     Objective: Vital signs in last 24 hours: Temp:  [97.4 F (36.3 C)-99 F (37.2 C)] 98.6 F (37 C) (01/17 0609) Pulse Rate:  [61-95] 64 (01/17 0609) Resp:  [11-28] 18 (01/17 0609) BP: (125-160)/(84-113) 133/90 (01/17 0609) SpO2:  [100 %] 100 % (01/17 0609) Weight:  [55.2 kg-55.8 kg] 55.2 kg (01/16 1637) Last BM Date: 02/10/18  Intake/Output from previous day: 01/16 0701 - 01/17 0700 In: 1579.1 [I.V.:1579.1] Out: 1638 [Urine:1700; Drains:70; Blood:50] Intake/Output this shift: Total I/O In: 79.1 [I.V.:79.1] Out: 970 [Urine:900; Drains:70]  General appearance: alert, cooperative and no distress Resp: breathing comfortably GI: soft, non distended.  wound c/d/i. drain serosang Extremities: extremities normal, atraumatic, no cyanosis or edema  Lab Results:  Recent Labs    02/08/18 0847 02/10/18 2032  WBC 6.9 18.2*  HGB 13.5 13.1  HCT 41.5 40.2  PLT 252 248   BMET Recent Labs    02/10/18 2032  CREATININE 0.82   PT/INR No results for input(s): LABPROT, INR in the last 72 hours. ABG No results for input(s): PHART, HCO3 in the last 72 hours.  Invalid input(s): PCO2, PO2  Studies/Results: No results found.  Anti-infectives: Anti-infectives (From admission, onward)   Start     Dose/Rate Route Frequency Ordered Stop   02/10/18 1800  ceFAZolin (ANCEF) IVPB 2g/100 mL premix     2 g 200 mL/hr over 30 Minutes Intravenous Every 8 hours 02/10/18 1636 02/10/18 1841   02/10/18 1045  ceFAZolin (ANCEF) IVPB 2g/100 mL premix  Status:  Discontinued     2 g 200 mL/hr over 30 Minutes Intravenous On call to O.R. 02/10/18 1035 02/10/18 1628      Assessment/Plan: s/p Procedure(s): HAND ASSISTED DISTAL PANCREATECTOMY ERAS PATHWAY (N/A) POSSIBLE SPLENECTOMY (N/A) d/c pca  Ambulate Full liquids. Hopefully home in next 1-2 days.     LOS: 1 day    Stark Klein 02/11/2018

## 2018-02-12 LAB — CBC
HCT: 31.7 % — ABNORMAL LOW (ref 36.0–46.0)
HEMOGLOBIN: 10.6 g/dL — AB (ref 12.0–15.0)
MCH: 32.8 pg (ref 26.0–34.0)
MCHC: 33.4 g/dL (ref 30.0–36.0)
MCV: 98.1 fL (ref 80.0–100.0)
Platelets: 205 10*3/uL (ref 150–400)
RBC: 3.23 MIL/uL — ABNORMAL LOW (ref 3.87–5.11)
RDW: 14.6 % (ref 11.5–15.5)
WBC: 13.9 10*3/uL — ABNORMAL HIGH (ref 4.0–10.5)
nRBC: 0 % (ref 0.0–0.2)

## 2018-02-12 LAB — BASIC METABOLIC PANEL
Anion gap: 6 (ref 5–15)
BUN: <5 mg/dL — ABNORMAL LOW (ref 6–20)
CHLORIDE: 107 mmol/L (ref 98–111)
CO2: 25 mmol/L (ref 22–32)
Calcium: 8.4 mg/dL — ABNORMAL LOW (ref 8.9–10.3)
Creatinine, Ser: 0.61 mg/dL (ref 0.44–1.00)
GFR calc Af Amer: >60 mL/min (ref >60)
GFR calc non Af Amer: >60 mL/min (ref >60)
Glucose, Bld: 91 mg/dL (ref 70–99)
Potassium: 3.6 mmol/L (ref 3.5–5.1)
SODIUM: 138 mmol/L (ref 135–145)

## 2018-02-12 NOTE — Progress Notes (Signed)
Patient ambulated the halls independently.  She walked around 300 ft.  Patient is trying to have a BM.    MD, patient is requesting Miralax.  She stated that she normally takes fiber at home and would like to re-start taking this medication.  Thank you.

## 2018-02-12 NOTE — Progress Notes (Signed)
Patient ID: Diana Francis, female   DOB: [birth year], 38 y.o.   MRN: [id number] [facility] Progress Note:   2 Days Post-Op  Subjective: Mental status is clear.   Objective: Vital signs in last 24 hours: Temp:  [98.5 F (36.9 C)-98.9 F (37.2 C)] 98.5 F (36.9 C) (01/18 0517) Pulse Rate:  [59-79] 70 (01/18 0518) Resp:  [18] 18 (01/18 0517) BP: (123-141)/(90-100) 123/90 (01/18 0518) SpO2:  [100 %] 100 % (01/18 0517)  Intake/Output from previous day: 01/17 0701 - 01/18 0700 In: 2335.8 [P.O.:1220; I.V.:1115.8] Out: 430 [Urine:400; Drains:30] Intake/Output this shift: No intake/output data recorded.  Physical Exam: Work of breathing is normal.  JP serosanguinous.  Incisions covered with Dermabond and not red.  Has passed some gas  Lab Results:  Results for orders placed or performed during the hospital encounter of 02/10/18 (from the past 48 hour(s))  CBC     Status: Abnormal   Collection Time: 02/10/18  8:32 PM  Result Value Ref Range   WBC 18.2 (H) 4.0 - 10.5 K/uL   RBC 4.06 3.87 - 5.11 MIL/uL   Hemoglobin 13.1 12.0 - 15.0 g/dL   HCT 40.2 36.0 - 46.0 %   MCV 99.0 80.0 - 100.0 fL   MCH 32.3 26.0 - 34.0 pg   MCHC 32.6 30.0 - 36.0 g/dL   RDW 14.6 11.5 - 15.5 %   Platelets 248 150 - 400 K/uL   nRBC 0.0 0.0 - 0.2 %    Comment: Performed at [facility], [place] [address], [place], [place] [address]  Creatinine, serum     Status: None   Collection Time: 02/10/18  8:32 PM  Result Value Ref Range   Creatinine, Ser 0.82 0.44 - 1.00 mg/dL   GFR calc non Af Amer >60 >60 mL/min   GFR calc Af Amer >60 >60 mL/min    Comment: Performed at [place] [address] [place], [place] [address]  CBC     Status: Abnormal   Collection Time: 02/11/18  6:38 AM  Result Value Ref Range   WBC 22.4 (H) 4.0 - 10.5 K/uL   RBC 3.71 (L) 3.87 - 5.11 MIL/uL   Hemoglobin 12.4 12.0 - 15.0 g/dL   HCT 36.8 36.0 - 46.0 %   MCV 99.2 80.0 - 100.0 fL   MCH 33.4 26.0 - 34.0 pg    MCHC 33.7 30.0 - 36.0 g/dL   RDW 14.7 11.5 - 15.5 %   Platelets 245 150 - 400 K/uL   nRBC 0.0 0.0 - 0.2 %    Comment: Performed at [facility], [place] [address], [place], [place] [address]  Basic metabolic panel     Status: Abnormal   Collection Time: 02/11/18  6:38 AM  Result Value Ref Range   Sodium 136 135 - 145 mmol/L   Potassium 3.8 3.5 - 5.1 mmol/L   Chloride 104 98 - 111 mmol/L   CO2 22 22 - 32 mmol/L   Glucose, Bld 105 (H) 70 - 99 mg/dL   BUN <5 (L) 6 - 20 mg/dL   Creatinine, Ser 0.91 0.44 - 1.00 mg/dL   Calcium 8.9 8.9 - 10.3 mg/dL   GFR calc non Af Amer >60 >60 mL/min   GFR calc Af Amer >60 >60 mL/min   Anion gap 10 5 - 15    Comment: Performed at [facility], [place] [address], [place], [place] [address]  CBC  Status: Abnormal   Collection Time: 02/12/18  3:30 AM  Result Value Ref Range   WBC 13.9 (H) 4.0 - 10.5 K/uL   RBC 3.23 (L) 3.87 - 5.11 MIL/uL   Hemoglobin 10.6 (L) 12.0 - 15.0 g/dL   HCT 31.7 (L) 36.0 - 46.0 %   MCV 98.1 80.0 - 100.0 fL   MCH 32.8 26.0 - 34.0 pg   MCHC 33.4 30.0 - 36.0 g/dL   RDW 14.6 11.5 - 15.5 %   Platelets 205 150 - 400 K/uL   nRBC 0.0 0.0 - 0.2 %    Comment: Performed at [facility], [place] [address], [place], [place] [address]  Basic metabolic panel     Status: Abnormal   Collection Time: 02/12/18  3:30 AM  Result Value Ref Range   Sodium 138 135 - 145 mmol/L   Potassium 3.6 3.5 - 5.1 mmol/L   Chloride 107 98 - 111 mmol/L   CO2 25 22 - 32 mmol/L   Glucose, Bld 91 70 - 99 mg/dL   BUN <5 (L) 6 - 20 mg/dL   Creatinine, Ser 0.61 0.44 - 1.00 mg/dL   Calcium 8.4 (L) 8.9 - 10.3 mg/dL   GFR calc non Af Amer >60 >60 mL/min   GFR calc Af Amer >60 >60 mL/min   Anion gap 6 5 - 15    Comment: Performed at [facility], [place] [address] [place], [place] [address]    Radiology/Results: No results found.  Anti-infectives: Anti-infectives (From admission, onward)   Start     Dose/Rate Route Frequency Ordered Stop    02/10/18 1800  ceFAZolin (ANCEF) IVPB 2g/100 mL premix     2 g 200 mL/hr over 30 Minutes Intravenous Every 8 hours 02/10/18 1636 02/10/18 1841   02/10/18 1045  ceFAZolin (ANCEF) IVPB 2g/100 mL premix  Status:  Discontinued     2 g 200 mL/hr over 30 Minutes Intravenous On call to O.R. 02/10/18 1035 02/10/18 1628      Assessment/Plan: Problem List: Patient Active Problem List   Diagnosis Date Noted  . Abnormal CT scan, gastrointestinal tract   . Pancreatic mass   . Depression, major, recurrent, moderate ([place]) 10/03/2015    Class: Chronic  . Depression 12/13/2012  . PTSD (post-traumatic stress disorder) 09/27/2012    Will advance diet.  Check lab in AM.  Plan discharge with drain.   2 Days Post-Op    LOS: 2 days   Matt B. Hassell Done, MD, [facility] [phone] beeper [phone]  02/12/2018 9:26 AM

## 2018-02-13 LAB — BASIC METABOLIC PANEL
Anion gap: 9 (ref 5–15)
CHLORIDE: 103 mmol/L (ref 98–111)
CO2: 26 mmol/L (ref 22–32)
CREATININE: 0.49 mg/dL (ref 0.44–1.00)
Calcium: 8.5 mg/dL — ABNORMAL LOW (ref 8.9–10.3)
GFR calc Af Amer: >60 mL/min (ref >60)
GFR calc non Af Amer: >60 mL/min (ref >60)
Glucose, Bld: 83 mg/dL (ref 70–99)
POTASSIUM: 3.8 mmol/L (ref 3.5–5.1)
Sodium: 138 mmol/L (ref 135–145)

## 2018-02-13 LAB — CBC
HCT: 33.5 % — ABNORMAL LOW (ref 36.0–46.0)
Hemoglobin: 11.1 g/dL — ABNORMAL LOW (ref 12.0–15.0)
MCH: 33 pg (ref 26.0–34.0)
MCHC: 33.1 g/dL (ref 30.0–36.0)
MCV: 99.7 fL (ref 80.0–100.0)
Platelets: 232 10*3/uL (ref 150–400)
RBC: 3.36 MIL/uL — AB (ref 3.87–5.11)
RDW: 14.6 % (ref 11.5–15.5)
WBC: 13.5 10*3/uL — ABNORMAL HIGH (ref 4.0–10.5)
nRBC: 0 % (ref 0.0–0.2)

## 2018-02-13 MED ORDER — BISACODYL 10 MG RE SUPP
10.0000 mg | Freq: Once | RECTAL | Status: AC
  Administered 2018-02-13: 10 mg via RECTAL
  Filled 2018-02-13: qty 1

## 2018-02-13 NOTE — Progress Notes (Signed)
Patient complaining of dry heaving, no actual vomit. PRN Zofran administered.

## 2018-02-13 NOTE — Progress Notes (Signed)
Patient ID: Diana Francis, female   DOB: 1980/08/10, 38 y.o.   MRN: [id number] [facility] Surgery Progress Note:   3 Days Post-Op  Subjective: Mental status is clear;  Had dry heaves last night.  Complaining of constipation.   Objective: Vital signs in last 24 hours: Temp:  [98.4 F (36.9 C)-98.9 F (37.2 C)] 98.4 F (36.9 C) (01/19 0509) Pulse Rate:  [63-93] 93 (01/19 0509) Resp:  [18] 18 (01/19 0509) BP: (126-129)/(84-92) 129/87 (01/19 0509) SpO2:  [100 %] 100 % (01/19 0509)  Intake/Output from previous day: 01/18 0701 - 01/19 0700 In: 553.3 [I.V.:553.3] Out: 510 [Urine:500; Drains:10] Intake/Output this shift: Total I/O In: 222 [P.O.:222] Out: 30 [Drains:30]  Physical Exam: Work of breathing is normal.  Incisions look good.  Abdomen is nontender; drainage is serosanguinous and minimal quantitiy  Lab Results:  Results for orders placed or performed during the hospital encounter of 02/10/18 (from the past 48 hour(s))  CBC     Status: Abnormal   Collection Time: 02/12/18  3:30 AM  Result Value Ref Range   WBC 13.9 (H) 4.0 - 10.5 K/uL   RBC 3.23 (L) 3.87 - 5.11 MIL/uL   Hemoglobin 10.6 (L) 12.0 - 15.0 g/dL   HCT 31.7 (L) 36.0 - 46.0 %   MCV 98.1 80.0 - 100.0 fL   MCH 32.8 26.0 - 34.0 pg   MCHC 33.4 30.0 - 36.0 g/dL   RDW 14.6 11.5 - 15.5 %   Platelets 205 150 - 400 K/uL   nRBC 0.0 0.0 - 0.2 %    Comment: Performed at [facility], [place] [address], [place], [place] [address]  Basic metabolic panel     Status: Abnormal   Collection Time: 02/12/18  3:30 AM  Result Value Ref Range   Sodium 138 135 - 145 mmol/L   Potassium 3.6 3.5 - 5.1 mmol/L   Chloride 107 98 - 111 mmol/L   CO2 25 22 - 32 mmol/L   Glucose, Bld 91 70 - 99 mg/dL   BUN <5 (L) 6 - 20 mg/dL   Creatinine, Ser 0.61 0.44 - 1.00 mg/dL   Calcium 8.4 (L) 8.9 - 10.3 mg/dL   GFR calc non Af Amer >60 >60 mL/min   GFR calc Af Amer >60 >60 mL/min   Anion gap 6 5 - 15    Comment: Performed at [facility], [place] [address], [place], [place] [address]  CBC     Status: Abnormal   Collection Time: 02/13/18  3:13 AM  Result Value Ref Range   WBC 13.5 (H) 4.0 - 10.5 K/uL   RBC 3.36 (L) 3.87 - 5.11 MIL/uL   Hemoglobin 11.1 (L) 12.0 - 15.0 g/dL   HCT 33.5 (L) 36.0 - 46.0 %   MCV 99.7 80.0 - 100.0 fL   MCH 33.0 26.0 - 34.0 pg   MCHC 33.1 30.0 - 36.0 g/dL   RDW 14.6 11.5 - 15.5 %   Platelets 232 150 - 400 K/uL   nRBC 0.0 0.0 - 0.2 %    Comment: Performed at [facility], [place] [address], [place], [place] [address]  Basic metabolic panel     Status: Abnormal   Collection Time: 02/13/18  3:13 AM  Result Value Ref Range   Sodium 138 135 - 145 mmol/L   Potassium 3.8 3.5 - 5.1 mmol/L   Chloride 103 98 - 111 mmol/L   CO2 26 22 - 32 mmol/L  Glucose, Bld 83 70 - 99 mg/dL   BUN <5 (L) 6 - 20 mg/dL   Creatinine, Ser 0.49 0.44 - 1.00 mg/dL   Calcium 8.5 (L) 8.9 - 10.3 mg/dL   GFR calc non Af Amer >60 >60 mL/min   GFR calc Af Amer >60 >60 mL/min   Anion gap 9 5 - 15    Comment: Performed at [place] [address], [place], [place] [address]    Radiology/Results: No results found.  Anti-infectives: Anti-infectives (From admission, onward)   Start     Dose/Rate Route Frequency Ordered Stop   02/10/18 1800  ceFAZolin (ANCEF) IVPB 2g/100 mL premix     2 g 200 mL/hr over 30 Minutes Intravenous Every 8 hours 02/10/18 1636 02/10/18 1841   02/10/18 1045  ceFAZolin (ANCEF) IVPB 2g/100 mL premix  Status:  Discontinued     2 g 200 mL/hr over 30 Minutes Intravenous On call to O.R. 02/10/18 1035 02/10/18 1628      Assessment/Plan: Problem List: Patient Active Problem List   Diagnosis Date Noted  . Abnormal CT scan, gastrointestinal tract   . Pancreatic mass   . Depression, major, recurrent, moderate ([place]) 10/03/2015    Class: Chronic  . Depression 12/13/2012  . PTSD (post-traumatic stress disorder) 09/27/2012    Will give Dulcolax suppository and allow to shower  today.;   3 Days Post-Op    LOS: 3 days   Matt B. Hassell Done, MD, [facility] [phone] beeper [phone]  02/13/2018 9:38 AM

## 2018-02-14 LAB — CBC
HCT: 33.7 % — ABNORMAL LOW (ref 36.0–46.0)
Hemoglobin: 11.2 g/dL — ABNORMAL LOW (ref 12.0–15.0)
MCH: 32.6 pg (ref 26.0–34.0)
MCHC: 33.2 g/dL (ref 30.0–36.0)
MCV: 98 fL (ref 80.0–100.0)
Platelets: 235 10*3/uL (ref 150–400)
RBC: 3.44 MIL/uL — ABNORMAL LOW (ref 3.87–5.11)
RDW: 13.9 % (ref 11.5–15.5)
WBC: 11.3 10*3/uL — ABNORMAL HIGH (ref 4.0–10.5)
nRBC: 0 % (ref 0.0–0.2)

## 2018-02-14 LAB — BASIC METABOLIC PANEL
Anion gap: 9 (ref 5–15)
BUN: <5 mg/dL — ABNORMAL LOW (ref 6–20)
CO2: 26 mmol/L (ref 22–32)
CREATININE: 0.57 mg/dL (ref 0.44–1.00)
Calcium: 8.5 mg/dL — ABNORMAL LOW (ref 8.9–10.3)
Chloride: 102 mmol/L (ref 98–111)
GFR calc Af Amer: >60 mL/min (ref >60)
GFR calc non Af Amer: >60 mL/min (ref >60)
Glucose, Bld: 83 mg/dL (ref 70–99)
Potassium: 3.8 mmol/L (ref 3.5–5.1)
Sodium: 137 mmol/L (ref 135–145)

## 2018-02-14 MED ORDER — TRAMADOL HCL 50 MG PO TABS: 50.0000 mg | ORAL_TABLET | Freq: Four times a day (QID) | ORAL | 1 refills | Status: AC | PRN

## 2018-02-14 NOTE — Plan of Care (Signed)

## 2018-02-14 NOTE — Progress Notes (Signed)
Pt left ambulatory with her family member to front of hospital. Pt had her discharge instruction and prescriptions

## 2018-02-14 NOTE — Discharge Instructions (Signed)
[facility]      [facility] °[phone] ° °ABDOMINAL SURGERY: POST OP INSTRUCTIONS ° °Always review your discharge instruction sheet given to you by the facility where your surgery was performed. ° °IF YOU HAVE DISABILITY OR FAMILY LEAVE FORMS, YOU MUST BRING THEM TO THE OFFICE FOR PROCESSING.  PLEASE DO NOT GIVE THEM TO YOUR DOCTOR. ° °1. A prescription for pain medication may be given to you upon discharge.  Take your pain medication as prescribed, if needed.  If narcotic pain medicine is not needed, then you may take acetaminophen (Tylenol) or ibuprofen (Advil) as needed. °2. Take your usually prescribed medications unless otherwise directed. °3. If you need a refill on your pain medication, please contact your pharmacy. They will contact our office to request authorization.  Prescriptions will not be filled after 5pm or on week-ends. °4. You should follow a light diet the first few days after arrival home, such as soup and crackers, pudding, etc.unless your doctor has advised otherwise. A high-fiber, low fat diet can be resumed as tolerated.   Be sure to include lots of fluids daily. Most patients will experience some swelling and bruising on the chest and neck area.  Ice packs will help.  Swelling and bruising can take several days to resolve °5. Most patients will experience some swelling and bruising in the area of the incision. Ice pack will help. Swelling and bruising can take several days to resolve..  °6. It is common to experience some constipation if taking pain medication after surgery.  Increasing fluid intake and taking a stool softener will usually help or prevent this problem from occurring.  A mild laxative (Milk of Magnesia or Miralax) should be taken according to package directions if there are no bowel movements after 48 hours. °7.  You may have steri-strips (small skin tapes) in place directly over the incision.  These strips should be left on the skin for 10-14 days.  If your  surgeon used skin glue on the incision, you may shower in 48 hours.  The glue will flake off over the next 2-3 weeks.  Any sutures or staples will be removed at the office during your follow-up visit. You may find that a light gauze bandage over your incision may keep your staples from being rubbed or pulled. You may shower and replace the bandage daily. °8. ACTIVITIES:  You may resume regular (light) daily activities beginning the next day--such as daily self-care, walking, climbing stairs--gradually increasing activities as tolerated.  You may have sexual intercourse when it is comfortable.  Refrain from any heavy lifting or straining until approved by your doctor. °a. You may drive when you no longer are taking prescription pain medication, you can comfortably wear a seatbelt, and you can safely maneuver your car and apply brakes °b. Return to Work: __________8 weeks if applicable_________________________ °9. You should see your doctor in the office for a follow-up appointment approximately two weeks after your surgery.  Make sure that you call for this appointment within a day or two after you arrive home to insure a convenient appointment time. °OTHER INSTRUCTIONS:  °_____________________________________________________________ °_____________________________________________________________ ° °WHEN TO CALL YOUR DOCTOR: °1. Fever over 101.0 °2. Inability to urinate °3. Nausea and/or vomiting °4. Extreme swelling or bruising °5. Continued bleeding from incision. °6. Increased pain, redness, or drainage from the incision. °7. Difficulty swallowing or breathing °8. Muscle cramping or spasms. °9. Numbness or tingling in hands or feet or around lips. ° °The clinic staff is   available to answer your questions during regular business hours.  Please don’t hesitate to call and ask to speak to one of the nurses if you have concerns. ° °For further questions, please visit [email or web address] ° ° ° °

## 2018-02-14 NOTE — Discharge Summary (Signed)
Physician Discharge Summary  Patient ID: Diana Francis MRN: [id number] DOB/AGE: November 12, 1980 38 y.o.  Admit date: 02/10/2018 Discharge date: 02/14/2018  Admission Diagnoses: Patient Active Problem List   Diagnosis Date Noted  . Abnormal CT scan, gastrointestinal tract   . Pancreatic mass   . Depression, major, recurrent, moderate ([place]) 10/03/2015    Class: Chronic  . Depression 12/13/2012  . PTSD (post-traumatic stress disorder) 09/27/2012    Discharge Diagnoses:  Active Problems:   Pancreatic mass and same as above.    Discharged Condition: stable  Hospital Course:  Pt was admitted to the floor following an hand assisted resection of a pancreatic mass 02/10/2018.  She did well and requested to get rid of the PCA.  Was ambulating well.  Was able to void with foley removal.  With diet advance, had an evening with dry heaves.  After another 24 hours, this resolved and she desired to go home.  She was discharged to home in stable condition.    Consults: None  Significant Diagnostic Studies: labs: Cr 0.57, HCT 33.7 prior to d/c.    Treatments: surgery: see above  Discharge Exam: Blood pressure 120/81, pulse 72, temperature 98.2 F (36.8 C), temperature source Oral, resp. rate 16, height 5' 4.5" (1.638 m), weight 55.2 kg, last menstrual period 12/19/2012, SpO2 100 %. General appearance: alert, cooperative and no distress Resp: breathing comfortably GI: soft, non distended, approp tender at incision.  no erythema.  JP serosang and was removed.   Extremities: extremities normal, atraumatic, no cyanosis or edema  Disposition: Discharge disposition: 01-Home or Self Care       Discharge Instructions    Call MD for:  difficulty breathing, headache or visual disturbances   Complete by:  As directed    Call MD for:  persistant nausea and vomiting   Complete by:  As directed    Call MD for:  redness, tenderness, or signs of infection (pain, swelling, redness, odor or  green/yellow discharge around incision site)   Complete by:  As directed    Call MD for:  severe uncontrolled pain   Complete by:  As directed    Call MD for:  temperature >100.4   Complete by:  As directed    Diet - low sodium heart healthy   Complete by:  As directed    Increase activity slowly   Complete by:  As directed      Allergies as of 02/14/2018      Reactions   Bactrim [sulfamethoxazole-trimethoprim] Nausea And Vomiting      Medication List    TAKE these medications   BENEFIBER Powd Take 1 packet by mouth daily.   dicyclomine 20 MG tablet Commonly known as:  BENTYL Take 1 tablet (20 mg total) by mouth 2 (two) times daily.   famotidine 20 MG tablet Commonly known as:  PEPCID Take 1 tablet (20 mg total) by mouth 2 (two) times daily.   ibuprofen 200 MG tablet Commonly known as:  ADVIL,MOTRIN Take 400-800 mg by mouth 2 (two) times daily as needed for headache.   metoCLOPramide 10 MG tablet Commonly known as:  REGLAN Take 1 tablet (10 mg total) by mouth every 8 (eight) hours as needed for nausea or vomiting.   ondansetron 4 MG disintegrating tablet Commonly known as:  ZOFRAN ODT Take 1 tablet (4 mg total) by mouth every 8 (eight) hours as needed for nausea or vomiting.   traMADol 50 MG tablet Commonly known as:  ULTRAM Take 1 tablet (  50 mg total) by mouth every 6 (six) hours as needed (moderate to severe pain.).   Vitamin D (Ergocalciferol) 1.25 MG (50000 UT) Caps capsule Commonly known as:  DRISDOL Take 50,000 Units by mouth every Sunday.        Signed: Stark Klein 02/14/2018, 1:36 PM

## 2018-03-08 DIAGNOSIS — H52222 Regular astigmatism, left eye: Secondary | ICD-10-CM | POA: Diagnosis not present

## 2018-03-08 DIAGNOSIS — H5203 Hypermetropia, bilateral: Secondary | ICD-10-CM | POA: Diagnosis not present

## 2019-02-01 ENCOUNTER — Other Ambulatory Visit: Payer: Self-pay | Admitting: General Surgery

## 2019-02-02 ENCOUNTER — Other Ambulatory Visit: Payer: Self-pay | Admitting: General Surgery

## 2019-02-02 DIAGNOSIS — C259 Malignant neoplasm of pancreas, unspecified: Secondary | ICD-10-CM

## 2019-02-10 ENCOUNTER — Ambulatory Visit
Admission: RE | Admit: 2019-02-10 | Discharge: 2019-02-10 | Disposition: A | Discharge status: Home / Self Care | Payer: 59 | Source: Ambulatory Visit | Attending: General Surgery | Admitting: General Surgery

## 2019-02-10 DIAGNOSIS — C259 Malignant neoplasm of pancreas, unspecified: Secondary | ICD-10-CM

## 2019-02-10 MED ORDER — IOPAMIDOL (ISOVUE-300) INJECTION 61%
80.0000 mL | Freq: Once | INTRAVENOUS | Status: AC | PRN
  Administered 2019-02-10: 80 mL via INTRAVENOUS

## 2019-04-01 ENCOUNTER — Ambulatory Visit: Payer: 59 | Attending: Internal Medicine

## 2019-04-01 DIAGNOSIS — Z23 Encounter for immunization: Secondary | ICD-10-CM | POA: Insufficient documentation

## 2019-04-01 NOTE — Progress Notes (Signed)
   Covid-19 Vaccination Clinic  Name:  DAYANNA INGHRAM    MRN: [id number] DOB: [birth year]  04/01/2019  Ms. Daigler was observed post Covid-19 immunization for 15 minutes without incident. She was provided with Vaccine Information Sheet and instruction to access the V-Safe system.   Ms. Bowe was instructed to call 911 with any severe reactions post vaccine: Marland Kitchen Difficulty breathing  . Swelling of face and throat  . A fast heartbeat  . A bad rash all over body  . Dizziness and weakness   Immunizations Administered    Name Date Dose VIS Date Route   Pfizer COVID-19 Vaccine 04/01/2019 12:59 PM 0.3 mL 01/06/2019 Intramuscular   Manufacturer: [place]   Lot: [id number]   [place]: [id number]

## 2019-04-21 ENCOUNTER — Ambulatory Visit: Payer: Self-pay | Admitting: *Deleted

## 2019-04-21 NOTE — Telephone Encounter (Signed)
Patient is sch for her 2nd shot and has a sinus infection . She is wanting to know can she still receive the 2nd shot . Please cal pt pt   Had test for covid and negative a couple days ago thru her job. Now  has sinus infection and has not started taking antibiotics at this time. She is scheduled for the 2nd pfizer on Saturday.  She is advised that she should wait until she is over the infection. Advised that she may not be able to get it at this time. Also that she has up to 6 weeks to get it. She works at a [employer] and has a son with asthma and feels like she needs to get this vaccine now. Not able to reschedule the 2nd vaccine. She is going to the appointment tomorrow as scheduled.

## 2019-04-22 ENCOUNTER — Ambulatory Visit: Payer: 59 | Attending: Internal Medicine

## 2019-04-22 DIAGNOSIS — Z23 Encounter for immunization: Secondary | ICD-10-CM

## 2019-04-22 NOTE — Progress Notes (Signed)
   Covid-19 Vaccination Clinic  Name:  ROSALIN CAUSER    MRN: [id number] DOB: 1980/05/11  04/22/2019  Ms. Bethards was observed post Covid-19 immunization for 15 minutes without incident. She was provided with Vaccine Information Sheet and instruction to access the V-Safe system.   Ms. Warhurst was instructed to call 911 with any severe reactions post vaccine: Marland Kitchen Difficulty breathing  . Swelling of face and throat  . A fast heartbeat  . A bad rash all over body  . Dizziness and weakness   Immunizations Administered    Name Date Dose VIS Date Route   Pfizer COVID-19 Vaccine 04/22/2019  1:52 PM 0.3 mL 01/06/2019 Intramuscular   Manufacturer: [place]   Lot: [id number]   [place]: [id number]

## 2019-05-02 ENCOUNTER — Ambulatory Visit: Payer: Self-pay

## 2020-01-03 ENCOUNTER — Other Ambulatory Visit: Payer: Self-pay

## 2020-01-03 ENCOUNTER — Ambulatory Visit
Admission: RE | Admit: 2020-01-03 | Discharge: 2020-01-03 | Disposition: A | Discharge status: Home / Self Care | Payer: 59 | Source: Ambulatory Visit | Attending: Internal Medicine | Admitting: Internal Medicine

## 2020-01-03 ENCOUNTER — Other Ambulatory Visit: Payer: Self-pay | Admitting: Internal Medicine

## 2020-01-03 DIAGNOSIS — Z1231 Encounter for screening mammogram for malignant neoplasm of breast: Secondary | ICD-10-CM

## 2020-01-12 ENCOUNTER — Ambulatory Visit (INDEPENDENT_AMBULATORY_CARE_PROVIDER_SITE_OTHER): Payer: 59 | Admitting: Podiatry

## 2020-01-12 ENCOUNTER — Ambulatory Visit (INDEPENDENT_AMBULATORY_CARE_PROVIDER_SITE_OTHER): Payer: 59

## 2020-01-12 ENCOUNTER — Other Ambulatory Visit: Payer: Self-pay

## 2020-01-12 DIAGNOSIS — M21612 Bunion of left foot: Secondary | ICD-10-CM

## 2020-01-12 DIAGNOSIS — M79672 Pain in left foot: Secondary | ICD-10-CM

## 2020-01-12 DIAGNOSIS — M2012 Hallux valgus (acquired), left foot: Secondary | ICD-10-CM | POA: Diagnosis not present

## 2020-01-12 DIAGNOSIS — M7750 Other enthesopathy of unspecified foot: Secondary | ICD-10-CM

## 2020-01-16 ENCOUNTER — Other Ambulatory Visit: Payer: Self-pay | Admitting: General Surgery

## 2020-01-16 DIAGNOSIS — C259 Malignant neoplasm of pancreas, unspecified: Secondary | ICD-10-CM

## 2020-01-30 NOTE — Progress Notes (Signed)
  Subjective:  Patient ID: Diana Francis, female    DOB: 1980/06/26,  MRN: [id number]  No chief complaint on file.  40 y.o. female presents with the above complaint. History confirmed with patient.  Reports pain to the inside of left midfoot for 6 months worse with activity.  Worse with certain shoe gear.  Objective:  Physical Exam: warm, good capillary refill, no trophic changes or ulcerative lesions, normal DP and PT pulses and normal sensory exam. Left Foot: Hallux valgus deformity with pain to palpation about the medial side of the first metatarsal phalangeal joint with prominent bursa.  No images are attached to the encounter.  Radiographs: X-ray of the left foot: no fracture, dislocation, swelling or degenerative changes noted and Haglund deformity noted Assessment:   1. Capsulitis of metatarsophalangeal (MTP) joint   2. Hallux valgus with bunions of left foot    Plan:  Patient was evaluated and treated and all questions answered.  Hallux Valgus left -XR reviewed with patient -Educated on etiology of deformity -Discussed proper shoe gear modifications and padding  -Patient has failed all conservative therapy and wishes to proceed with surgical intervention. All risks, benefits, and alternatives discussed with patient. No guarantees given. Consent reviewed and signed by patient. -Planned procedures: Left [place] bunionectomy -Identified risk factors: none  No follow-ups on file.

## 2020-01-30 NOTE — Progress Notes (Deleted)
  Subjective:  Patient ID: Diana Francis, female    DOB: 06/08/1980,  MRN: [id number]  No chief complaint on file.   40 y.o. female presents with the above complaint. History confirmed with patient.  Reports callus lesions to the outsides of both feet worse on the left than the right.  States that they became very sore and has been trying to soak at home and filing down.  PCP is given her gabapentin for the pain.  States that pain shoots into the little toe.  Objective:  Physical Exam: warm, good capillary refill, no trophic changes or ulcerative lesions, normal DP and PT pulses and normal sensory exam.  Pain to palpation of both bilateral fifth MPJs with punctate keratosis subfifth metatarsal  No images are attached to the encounter.  Radiographs: X-ray of both feet: no fracture, dislocation, swelling or degenerative changes noted Assessment:   1. Capsulitis of metatarsophalangeal (MTP) joint   2. Benign skin lesion   3. Porokeratosis      Plan:  Patient was evaluated and treated and all questions answered.  Capsulitis fifth MPJ bilateral with porokeratosis -Lesions debrided and destroyed.  Educated on self-care.  X-rays reviewed patient.  Discussed self-care of the lesions.  Procedure: Destruction of Lesion Location: bilateral 5th MPJ Anesthesia: none Instrumentation: 15 blade. Technique: Debridement of lesion, small amount of salinocaine applied to the base of the lesion. Dressing: Dry, sterile, compression dressing. Disposition: Patient tolerated procedure well. Advised to leave dressing on for 6-8 hours. Thereafter patient to wash the area with soap and water and applied band-aid. Off-loading pads dispensed. Patient to return in 2 weeks for follow-up.   No follow-ups on file.

## 2020-01-30 NOTE — Addendum Note (Signed)
Addended by: Ventura Sellers on: 01/30/2020 01:07 PM   Modules accepted: Level of Service

## 2020-02-05 DIAGNOSIS — M79676 Pain in unspecified toe(s): Secondary | ICD-10-CM

## 2020-02-07 ENCOUNTER — Other Ambulatory Visit: Payer: Self-pay

## 2020-02-07 ENCOUNTER — Ambulatory Visit
Admission: RE | Admit: 2020-02-07 | Discharge: 2020-02-07 | Disposition: A | Discharge status: Home / Self Care | Payer: 59 | Source: Ambulatory Visit | Attending: General Surgery | Admitting: General Surgery

## 2020-02-07 ENCOUNTER — Other Ambulatory Visit: Payer: 59

## 2020-02-07 DIAGNOSIS — C259 Malignant neoplasm of pancreas, unspecified: Secondary | ICD-10-CM

## 2020-02-07 MED ORDER — IOPAMIDOL (ISOVUE-300) INJECTION 61%
100.0000 mL | Freq: Once | INTRAVENOUS | Status: AC | PRN
  Administered 2020-02-07: 100 mL via INTRAVENOUS

## 2020-02-23 ENCOUNTER — Telehealth: Payer: Self-pay

## 2020-02-23 NOTE — Telephone Encounter (Signed)
DOS 02/28/2020  [facility] OSTEOTOMY LT - 28310 [place] BUNIONECTOMY LT - [id number]  [employer] EFFECTIVE DATE - 01/27/2020  PLAN DEDUCTIBLE - $600.00 W/ $0.00 REMAINING OUT OF POCKET - $3000.00 W/ $2379.70 REMAINING COPAY $200.00 COINSURANCE - 15%   NOTIFICATION/PRIOR AUTHORIZATION NUMBER CASE STATUS CASE STATUS REASON PRIMARY CARE PHYSICIAN [id number] Closed Case Was Managed And Is Now Complete - ADVANCE NOTIFY DATE/TIME ADMISSION NOTIFY DATE/TIME 02/21/2020 02:35 PM CST - COVERAGE STATUS OVERALL COVERAGE STATUS Covered/Approved 1-2 CODE DESCRIPTION COVERAGE STATUS DECISION DATE [facility] Coverage determination is reflected for the facility admission and is not a guarantee of payment for ongoing services. Covered/Approved 02/21/2020 1 28296 Correction, hallux valgus (bunionectomy) more  Correction, hallux valgus (bunionectomy), with sesamoidectomy, when performed; with distal metatarsal osteotomy, any method  Covered/Approved 02/21/2020 2 28310 Osteotomy, shortening, angular or rotati more Covered/Approved 02/21/2020

## 2020-02-27 ENCOUNTER — Other Ambulatory Visit: Payer: Self-pay | Admitting: Podiatry

## 2020-02-27 MED ORDER — HYDROCODONE-ACETAMINOPHEN 5-325 MG PO TABS: 1.0000 | ORAL_TABLET | ORAL | 0 refills | Status: AC | PRN

## 2020-02-27 MED ORDER — ONDANSETRON HCL 4 MG PO TABS: 4.0000 mg | ORAL_TABLET | Freq: Three times a day (TID) | ORAL | 0 refills | Status: DC | PRN

## 2020-02-27 MED ORDER — CEPHALEXIN 500 MG PO CAPS: 500.0000 mg | ORAL_CAPSULE | Freq: Two times a day (BID) | ORAL | 0 refills | Status: DC

## 2020-02-27 NOTE — Progress Notes (Signed)
Rx sent to pharmacy for outpatient surgery. °

## 2020-02-28 DIAGNOSIS — M2012 Hallux valgus (acquired), left foot: Secondary | ICD-10-CM | POA: Diagnosis not present

## 2020-03-01 ENCOUNTER — Telehealth: Payer: Self-pay | Admitting: Podiatry

## 2020-03-01 NOTE — Telephone Encounter (Signed)
Called patient for post-op check. Doing very well post-operatively denies issues or concerns. Taking ibuprofen and only a little bit of her pain medication. Denies other issues. Advised she call with any post-op issues or concerns.

## 2020-03-05 ENCOUNTER — Ambulatory Visit (INDEPENDENT_AMBULATORY_CARE_PROVIDER_SITE_OTHER): Payer: 59

## 2020-03-05 ENCOUNTER — Other Ambulatory Visit: Payer: Self-pay | Admitting: Podiatrist

## 2020-03-05 ENCOUNTER — Other Ambulatory Visit: Payer: Self-pay

## 2020-03-05 ENCOUNTER — Ambulatory Visit (INDEPENDENT_AMBULATORY_CARE_PROVIDER_SITE_OTHER): Payer: 59 | Admitting: Podiatrist

## 2020-03-05 ENCOUNTER — Encounter: Payer: 59 | Admitting: Podiatry

## 2020-03-05 ENCOUNTER — Encounter: Payer: Self-pay | Admitting: Podiatrist

## 2020-03-05 DIAGNOSIS — Z9889 Other specified postprocedural states: Secondary | ICD-10-CM

## 2020-03-05 DIAGNOSIS — M2012 Hallux valgus (acquired), left foot: Secondary | ICD-10-CM

## 2020-03-05 DIAGNOSIS — M21612 Bunion of left foot: Secondary | ICD-10-CM

## 2020-03-05 NOTE — Patient Instructions (Signed)
Keep your foot dry for 1 more week  Start to move your great toe up and down to keep the range of motion in your toe.  You will see Dr. March Rummage again next week.  Call if you have any questions prior to this visit.

## 2020-03-05 NOTE — Progress Notes (Signed)
Chief Complaint  Patient presents with   Routine Post Op    POV#1 -Pt denies N/V/F/Ch -pt states,"first couple of days had bad pain on my foot, but now pain has been okay; 3/10 soreness." - w/ tinging -dressing dry, clean and intact Tx: IBU, sx shoe and elevation      Subjective: Patient presents today1 week status post foot surgery of the left foot-  Correction hallux abductovalgus deformity with screw fixation performed by Dr. March Rummage..   Patient denies nausea, vomiting, fevers, chills or night sweats.  Denies calf pain or tenderness to the operative side. She presents with the dressing intact and wearing her surgical shoe.    Objective:  Neurovascular status is intact with palpable pedal pulses DP and PT at 2+ out of 4 right foot.  Negative homans sign noted.  Neurological sensation is intact and unchanged as per prior to surgery. Excellent appearance of the postoperative foot is noted.  Dressing is intact upon presenting to the office and once removed sutures are noted to be in place with incision site healing well.  moderate swelling present with some mild bruising noted.   Xrays- 3 views of the left foot are obtained and show good correction and alignment of the first metatarsal head with screw fixation in place.  xrays otherwise unremarkable.   Assessment:    ICD-10-CM   1. Hallux valgus with bunions of left foot  M20.12    M21.612   2. Post-operative state  Z98.890      Plan:  I re-dressed the foot with a dry, sterile, compressive dressing.  I recommended she wait 1 more week before getting the foot wet.  She was instructed on range of motion exercises.  She will continue to wear the surgical shoe and elevate.  She will see Dr. March Rummage in 1 week and will call if any questions arise.

## 2020-03-08 ENCOUNTER — Other Ambulatory Visit: Payer: Self-pay

## 2020-03-08 ENCOUNTER — Ambulatory Visit (INDEPENDENT_AMBULATORY_CARE_PROVIDER_SITE_OTHER): Payer: 59

## 2020-03-08 ENCOUNTER — Ambulatory Visit (INDEPENDENT_AMBULATORY_CARE_PROVIDER_SITE_OTHER): Payer: 59 | Admitting: Podiatry

## 2020-03-08 DIAGNOSIS — M2012 Hallux valgus (acquired), left foot: Secondary | ICD-10-CM | POA: Diagnosis not present

## 2020-03-08 DIAGNOSIS — M21612 Bunion of left foot: Secondary | ICD-10-CM

## 2020-03-12 ENCOUNTER — Ambulatory Visit (INDEPENDENT_AMBULATORY_CARE_PROVIDER_SITE_OTHER): Payer: 59 | Admitting: Podiatry

## 2020-03-12 ENCOUNTER — Other Ambulatory Visit: Payer: Self-pay

## 2020-03-12 DIAGNOSIS — M2012 Hallux valgus (acquired), left foot: Secondary | ICD-10-CM

## 2020-03-12 DIAGNOSIS — M21612 Bunion of left foot: Secondary | ICD-10-CM

## 2020-03-12 DIAGNOSIS — Z9889 Other specified postprocedural states: Secondary | ICD-10-CM

## 2020-03-20 ENCOUNTER — Other Ambulatory Visit: Payer: Self-pay | Admitting: Podiatry

## 2020-03-20 DIAGNOSIS — M21612 Bunion of left foot: Secondary | ICD-10-CM

## 2020-03-20 DIAGNOSIS — M2012 Hallux valgus (acquired), left foot: Secondary | ICD-10-CM

## 2020-03-25 NOTE — Progress Notes (Signed)
  Subjective:  Patient ID: Diana Francis, female    DOB: [birth year],  MRN: [id number]  No chief complaint on file.  40 y.o. female presents with the above complaint. History confirmed with patient.  Had some bleeding through the bandages otherwise denies significant pain or issues.  Objective:  Physical Exam: tenderness at the surgical site, local edema noted and calf supple, nontender. Incision: healing well, no significant drainage, no dehiscence, slight clear drainage present  No images are attached to the encounter.  Radiographs: X-ray of the left foot: consistent with post-op state, good position of the osteotomy noted.   Assessment:   1. Hallux valgus with bunions of left foot     Plan:  Patient was evaluated and treated and all questions answered.  Post-operative State -XR reviewed with patient -Ok to start showering at this time. Advised they cannot soak. -Dressing applied consisting of sterile gauze, kerlix and ACE bandage -WBAT in Surgical shoe  No follow-ups on file.

## 2020-03-26 ENCOUNTER — Ambulatory Visit (INDEPENDENT_AMBULATORY_CARE_PROVIDER_SITE_OTHER): Payer: 59 | Admitting: Podiatry

## 2020-03-26 ENCOUNTER — Ambulatory Visit (INDEPENDENT_AMBULATORY_CARE_PROVIDER_SITE_OTHER): Payer: 59

## 2020-03-26 ENCOUNTER — Other Ambulatory Visit: Payer: Self-pay

## 2020-03-26 DIAGNOSIS — M2012 Hallux valgus (acquired), left foot: Secondary | ICD-10-CM

## 2020-03-26 DIAGNOSIS — E559 Vitamin D deficiency, unspecified: Secondary | ICD-10-CM | POA: Insufficient documentation

## 2020-03-26 DIAGNOSIS — G5602 Carpal tunnel syndrome, left upper limb: Secondary | ICD-10-CM | POA: Insufficient documentation

## 2020-03-26 DIAGNOSIS — R1902 Left upper quadrant abdominal swelling, mass and lump: Secondary | ICD-10-CM | POA: Insufficient documentation

## 2020-03-26 DIAGNOSIS — M21612 Bunion of left foot: Secondary | ICD-10-CM

## 2020-03-26 DIAGNOSIS — Z0000 Encounter for general adult medical examination without abnormal findings: Secondary | ICD-10-CM | POA: Insufficient documentation

## 2020-03-26 DIAGNOSIS — E669 Obesity, unspecified: Secondary | ICD-10-CM | POA: Insufficient documentation

## 2020-03-26 DIAGNOSIS — R1012 Left upper quadrant pain: Secondary | ICD-10-CM | POA: Insufficient documentation

## 2020-03-26 DIAGNOSIS — E039 Hypothyroidism, unspecified: Secondary | ICD-10-CM | POA: Insufficient documentation

## 2020-03-26 NOTE — Progress Notes (Addendum)
  Subjective:  Patient ID: Diana Francis, female    DOB: August 01, 1980,  MRN: [id number]  Chief Complaint  Patient presents with  . Routine Post Op    POV#3 -pt denies N/V/F/Ch -pt states sore, throbbing, foot is waking up -w/ swelling when standing; pain; 7-8/10 when staninding, w/ numbness Tx: compression sock and sxz shoe -pt states she had a little drainage Sat   40 y.o. female presents with the above complaint. History confirmed with patient.   Objective:  Physical Exam: tenderness at the surgical site, local edema noted and calf supple, nontender. Incision: healing well, no significant drainage  No images are attached to the encounter.  Radiographs: X-ray of the left foot: consistent with post-op state, good position noted.  Assessment:   1. Hallux valgus with bunions of left foot    Plan:  Patient was evaluated and treated and all questions answered.  Post-operative State -Wounds almost fully healed. -XR show good alignment and progressing healing. -She is due back at work in 2 weeks, I think she may need a couple extra weeks. Will update paperwork for her.  No follow-ups on file.

## 2020-04-08 NOTE — Progress Notes (Signed)
  Subjective:  Patient ID: Diana Francis, female    DOB: Jun 10, 1980,  MRN: [id number]  Chief Complaint  Patient presents with  . Routine Post Op    POV#2 -pt denies N/V/F/Ch -pt did dressing change 2 days w/ dry dressing - less drainage through the bandage per pt -pt denies pain     40 y.o. female presents with the above complaint. History confirmed with patient.  States the drainage is better.  Objective:  Physical Exam: tenderness at the surgical site, local edema noted and calf supple, nontender. Incision: healing well, no significant drainage, no dehiscence, slight clear drainage present  Assessment:   1. Hallux valgus with bunions of left foot   2. Post-operative state     Plan:  Patient was evaluated and treated and all questions answered.  Post-operative State -Dressing applied consisting of silvadene and band-aid -WBAT in Surgical shoe  -continue abx ointment and band-aid daily.  No follow-ups on file.

## 2020-04-09 ENCOUNTER — Ambulatory Visit (INDEPENDENT_AMBULATORY_CARE_PROVIDER_SITE_OTHER): Payer: 59 | Admitting: Podiatry

## 2020-04-09 ENCOUNTER — Other Ambulatory Visit: Payer: Self-pay

## 2020-04-09 ENCOUNTER — Ambulatory Visit (INDEPENDENT_AMBULATORY_CARE_PROVIDER_SITE_OTHER): Payer: 59

## 2020-04-09 DIAGNOSIS — Z9889 Other specified postprocedural states: Secondary | ICD-10-CM

## 2020-04-09 NOTE — Progress Notes (Signed)
  Subjective:  Patient ID: Diana Francis, female    DOB: [birth year],  MRN: [id number]  Chief Complaint  Patient presents with  . Routine Post Op    POV #4 DOS 02/28/2020 CORRECTION OF BUNION LT FOOT W/CUTTING & POSS SHIFTING OF BONE, POSS PHALANX OSTEOTOMY TO STRAIGHTEN TOE, OPEN VS MINIMALLY INVASIVE PROCEDURES. Pt complains of soreness tightness and edema.    40 y.o. female presents with the above complaint. History confirmed with patient.   Objective:  Physical Exam: tenderness at the surgical site, local edema noted and calf supple, nontender. Good ROM but tender on ROM. Incision: healing well, no significant drainage  No images are attached to the encounter.  Radiographs: X-ray of the left foot: consistent with post-op state, good position noted and progressive healing.  Assessment:   1. Post-operative state    Plan:  Patient was evaluated and treated and all questions answered.  Post-operative State -Wounds healed. -XR show progressive healing -Work on ROM exercises. -Compression anklet dispensed. She has quite mild swelling -Will continue surgical shoe x2 weeks. F/u in 2 weeks with repeat XRs  Return in about 2 weeks (around 04/23/2020) for Post-Op (with XRs).

## 2020-04-23 ENCOUNTER — Other Ambulatory Visit: Payer: Self-pay

## 2020-04-23 ENCOUNTER — Ambulatory Visit (INDEPENDENT_AMBULATORY_CARE_PROVIDER_SITE_OTHER): Payer: 59

## 2020-04-23 ENCOUNTER — Ambulatory Visit (INDEPENDENT_AMBULATORY_CARE_PROVIDER_SITE_OTHER): Payer: 59 | Admitting: Podiatry

## 2020-04-23 DIAGNOSIS — M2012 Hallux valgus (acquired), left foot: Secondary | ICD-10-CM

## 2020-04-23 DIAGNOSIS — M21612 Bunion of left foot: Secondary | ICD-10-CM

## 2020-04-23 NOTE — Progress Notes (Signed)
  Subjective:  Patient ID: Diana Francis, female    DOB: 1980/09/17,  MRN: [id number]  Chief Complaint  Patient presents with  . Routine Post Op     POV #5 DOS 02/28/2020 CORRECTION OF BUNION LT FOOT W/CUTTING & POSS SHIFTING OF BONE, POSS PHALANX OSTEOTOMY TO STRAIGHTEN TOE, OPEN VS MINIMALLY INVASIVE PROCEDURES   40 y.o. female presents with the above complaint. History confirmed with patient. Still having pain and discomfort and cannot wear normal shoes.  Objective:  Physical Exam: tenderness at the surgical site, local edema noted and calf supple, nontender. Good ROM but tender on ROM. Incision: healing well, no significant drainage  No images are attached to the encounter.  Radiographs: X-ray of the left foot: consistent with post-op state, good position noted and progressive healing.  Assessment:   1. Hallux valgus with bunions of left foot    Plan:  Patient was evaluated and treated and all questions answered.  Post-operative State -Wounds healed. -XR show progressing but still incomplete healing -She is not able to tolerate normal shoegear. We will continue surgical shoe. -Given inability to wear a shoe, will continue surgical shoe and plan for at least 2-3 weeks out of work.  Return in about 2 weeks (around 05/07/2020) for Post-Op (with XRs).

## 2020-05-07 ENCOUNTER — Ambulatory Visit (INDEPENDENT_AMBULATORY_CARE_PROVIDER_SITE_OTHER): Payer: 59 | Admitting: Podiatry

## 2020-05-07 ENCOUNTER — Ambulatory Visit (INDEPENDENT_AMBULATORY_CARE_PROVIDER_SITE_OTHER): Payer: 59

## 2020-05-07 ENCOUNTER — Other Ambulatory Visit: Payer: Self-pay

## 2020-05-07 DIAGNOSIS — Z9889 Other specified postprocedural states: Secondary | ICD-10-CM

## 2020-05-14 ENCOUNTER — Telehealth: Payer: Self-pay | Admitting: Podiatry

## 2020-05-14 NOTE — Telephone Encounter (Signed)
I am still holding for 05/07/2020 notes for patients disability. Her payments were discontinued until notes are sent over.

## 2020-05-14 NOTE — Telephone Encounter (Signed)
Dr. March Rummage, please complete ASAP!

## 2020-05-21 ENCOUNTER — Ambulatory Visit (INDEPENDENT_AMBULATORY_CARE_PROVIDER_SITE_OTHER): Payer: 59 | Admitting: Podiatry

## 2020-05-21 ENCOUNTER — Other Ambulatory Visit: Payer: Self-pay

## 2020-05-21 DIAGNOSIS — M21612 Bunion of left foot: Secondary | ICD-10-CM

## 2020-05-21 DIAGNOSIS — M2012 Hallux valgus (acquired), left foot: Secondary | ICD-10-CM

## 2020-05-21 DIAGNOSIS — Z9889 Other specified postprocedural states: Secondary | ICD-10-CM

## 2020-05-21 MED ORDER — METHYLPREDNISOLONE 4 MG PO TBPK: ORAL_TABLET | ORAL | 0 refills | Status: AC

## 2020-05-21 NOTE — Progress Notes (Signed)
  Subjective:  Patient ID: Diana Francis, female    DOB: 05/04/1980,  MRN: [id number]  Chief Complaint  Patient presents with  . Routine Post Op    . POV #6 DOS 02/28/2020 CORRECTION OF BUNION LT FOOT W/CUTTING & POSS SHIFTING OF BONE, POSS PHALANX OSTEOTOMY TO STRAIGHTEN TOE, OPEN VS MINIMALLY INVASIVE PROCEDURES. Pt complains of soreness and edema.    40 y.o. female presents with the above complaint. History confirmed with patient. Still having pain and discomfort and cannot wear normal shoes.  Objective:  Physical Exam: tenderness at the surgical site, local edema noted and calf supple, nontender. Good ROM but tender on ROM. Incision: Well-healed  No images are attached to the encounter.  Radiographs: X-ray of the left foot: consistent with post-op state, good position noted and almost complete healing  Assessment:   1. Post-operative state    Plan:  Patient was evaluated and treated and all questions answered.  Post-operative State -Wounds healed. -XR show near full healing -She is still unable to tolerate normal shoe gear.  We will continue out of work status with aggressive focus on getting into her normal shoe gear and range of motion exercises for her big toe.  Return in about 2 weeks (around 05/21/2020) for Post-Op (No XRs).

## 2020-05-21 NOTE — Progress Notes (Signed)
  Subjective:  Patient ID: Diana Francis, female    DOB: 04-03-1980,  MRN: [id number]  Chief Complaint  Patient presents with  . Routine Post Op    PER Dametria Tuzzolino POV #7 DOS 02/28/2020 CORRECTION OF BUNION LT FOOT W/CUTTING & POSS SHIFTING OF BONE, POSS PHALANX OSTEOTOMY TO STRAIGHTEN TOE, OPEN VS MINIMALLY INVASIVE PROCEDURES. Pt complains of stifness, soreness and edema. Pt states she is unable to wear a closed toe shoe. Pt still icing with elevation.    40 y.o. female presents with the above complaint. History confirmed with patient.   Objective:  Physical Exam: tenderness at the surgical site, local edema noted and calf supple, nontender. Good ROM but tender on ROM. Incision: Well-healed  No images are attached to the encounter.  Radiographs: X-ray of the left foot: consistent with post-op state, good position noted and almost complete healing  Assessment:   1. Post-operative state   2. Hallux valgus with bunions of left foot    Plan:  Patient was evaluated and treated and all questions answered.  Post-operative State -Wounds healed. -XR show near full healing -Continue ROM exercises -Rx steroids for continued pain. -She is unfortunately still able to tolerate normal shoegear. We will keep her out of work for 2 more weeks.  Return in about 3 weeks (around 06/11/2020) for Post-Op (with XRs).

## 2020-06-11 ENCOUNTER — Other Ambulatory Visit: Payer: Self-pay

## 2020-06-11 ENCOUNTER — Ambulatory Visit (INDEPENDENT_AMBULATORY_CARE_PROVIDER_SITE_OTHER): Payer: 59 | Admitting: Podiatry

## 2020-06-11 ENCOUNTER — Ambulatory Visit (INDEPENDENT_AMBULATORY_CARE_PROVIDER_SITE_OTHER): Payer: 59

## 2020-06-11 DIAGNOSIS — M2012 Hallux valgus (acquired), left foot: Secondary | ICD-10-CM | POA: Diagnosis not present

## 2020-06-11 DIAGNOSIS — M21612 Bunion of left foot: Secondary | ICD-10-CM | POA: Diagnosis not present

## 2020-06-11 DIAGNOSIS — M9689 Other intraoperative and postprocedural complications and disorders of the musculoskeletal system: Secondary | ICD-10-CM | POA: Diagnosis not present

## 2020-06-11 DIAGNOSIS — Z9889 Other specified postprocedural states: Secondary | ICD-10-CM

## 2020-06-11 NOTE — Progress Notes (Signed)
  Subjective:  Patient ID: Diana Francis, female    DOB: 1980/11/09,  MRN: [id number]  Chief Complaint  Patient presents with  . Routine Post Op    POV #8  DOS 02/28/2020 CORRECTION OF BUNION LT FOOT W/CUTTING & POSS SHIFTING OF BONE, POSS PHALANX OSTEOTOMY TO STRAIGHTEN TOE, OPEN VS MINIMALLY INVASIVE PROCEDURES. Pt complains of soreness and tightness and of foot. Pain between 1st and 2nd toe.    40 y.o. female presents with the above complaint. History confirmed with patient. She is still having pain. Does not think the steroids helped much. Has pains mostly near the joint.  Objective:  Physical Exam: Mild pain to palpation along dorsal 1st metatarsal. Good ROM without tenderness on ROM. Continued edema Incision: Well-healed  Radiographs: X-ray of the left foot: consistent with post-op state, good position, progressive healing of osteotomy.  Assessment:   1. 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 Hallux valgus with bunions of left foot    Plan:  Patient was evaluated and treated and all questions answered.  Post-operative State -Wounds healed. Full ROM appreciated. -XR with progressive healing, signs of partial union but it does appear delayed. -Dispensed compression anklet. -Will wear surgical shoe for 2 weeks to promote healing and then plan for return to work in 3 weeks. -While the screw does not appear too long she may need screw removal at a later date. Will discuss when fully fused.  No follow-ups on file.

## 2020-07-02 ENCOUNTER — Other Ambulatory Visit: Payer: Self-pay

## 2020-07-02 ENCOUNTER — Ambulatory Visit (INDEPENDENT_AMBULATORY_CARE_PROVIDER_SITE_OTHER): Payer: 59 | Admitting: Podiatry

## 2020-07-02 DIAGNOSIS — M9689 Other intraoperative and postprocedural complications and disorders of the musculoskeletal system: Secondary | ICD-10-CM

## 2020-07-02 DIAGNOSIS — M2012 Hallux valgus (acquired), left foot: Secondary | ICD-10-CM

## 2020-07-02 DIAGNOSIS — M21612 Bunion of left foot: Secondary | ICD-10-CM

## 2020-07-02 NOTE — Progress Notes (Signed)
  Subjective:  Patient ID: Diana Francis, female    DOB: [birth year],  MRN: [id number]  Chief Complaint  Patient presents with  . Routine Post Op    Reports some lingering puffiness and also some discomfort and sometimes stinging in ball of foot underneath hallux.    40 y.o. female presents with the above complaint. History confirmed with patient. Thinks the immobilization in the shoe helped rest the area. Can tolerate normal sneakers for 3 hours which is improvement but not where she needs to be to return to work.  Objective:  Physical Exam: Mild pain to palpation along dorsal 1st metatarsal. Good ROM with mild ROM restriction, reducible on ROM exercises. Scant edema. Incision: Well-healed  Assessment:   1. Hallux valgus with bunions of left foot   2. 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  Post-operative State -Continues to improve but can only wear her sneaker for 3 hours. Will continue aggressive ROM exercises and work on tolerating shoes. -Extend OOW x2 weeks. Tentative return 6/20.  Return in about 3 weeks (around 07/23/2020) for Post-Op (with XRs).

## 2020-07-23 ENCOUNTER — Ambulatory Visit (INDEPENDENT_AMBULATORY_CARE_PROVIDER_SITE_OTHER): Payer: 59 | Admitting: Podiatry

## 2020-07-23 ENCOUNTER — Other Ambulatory Visit: Payer: Self-pay

## 2020-07-23 ENCOUNTER — Ambulatory Visit (INDEPENDENT_AMBULATORY_CARE_PROVIDER_SITE_OTHER): Payer: 59

## 2020-07-23 DIAGNOSIS — M9689 Other intraoperative and postprocedural complications and disorders of the musculoskeletal system: Secondary | ICD-10-CM

## 2020-07-23 DIAGNOSIS — Z9889 Other specified postprocedural states: Secondary | ICD-10-CM

## 2020-07-23 DIAGNOSIS — M960 Pseudarthrosis after fusion or arthrodesis: Secondary | ICD-10-CM | POA: Diagnosis not present

## 2020-07-23 NOTE — Progress Notes (Addendum)
  Subjective:  Patient ID: Diana Francis, female    DOB: 11/14/1980,  MRN: [id number]  Chief Complaint  Patient presents with   Routine Post Op     3 week fu POV #10 DOS 02/28/2020 CORRECTION OF BUNION LT FOOT W/CUTTING & POSS SHIFTING OF BONE, POSS PHALANX OSTEOTOMY STRAIGHTEN TOE, OPEN VS MINIMALLY INVASIVE PROCEDURES   40 y.o. female presents with the above complaint. History confirmed with patient.  States that the pain is doing a little better she is still doing her range of motion exercises pain varies mostly in the great toe she still takes over-the-counter meds when it gets bad.  Wearing a shoe all day now and is back at work  Objective:  Physical Exam: Mild pain to palpation along dorsal 1st metatarsal. Good ROM with mild ROM restriction, reducible on ROM exercises. Scant edema. Incision: Well-healed  XR: 3 views of left foot: good alignment of the metatarsal with 61mm gapping plantarly with slight loosening noted around the screw. Assessment:   1. Pseudarthrosis after fusion or arthrodesis   2. Post-operative state   3. 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  Post-operative State with nonunion -XR reviewed with patient -Order Bone stimulator given nonunion -Possibly plan for screw removal at a later date, but looking less likely at this time.  Return in about 4 weeks (around 08/20/2020).

## 2020-07-30 ENCOUNTER — Telehealth: Payer: Self-pay | Admitting: Podiatry

## 2020-07-30 NOTE — Telephone Encounter (Signed)
Please advise her this is in process - have already spoken to the rep and working on approval

## 2020-07-30 NOTE — Telephone Encounter (Signed)
Patient is requesting a bone stimulator. Please advise.

## 2020-08-20 ENCOUNTER — Other Ambulatory Visit: Payer: Self-pay

## 2020-08-20 ENCOUNTER — Ambulatory Visit (INDEPENDENT_AMBULATORY_CARE_PROVIDER_SITE_OTHER): Payer: 59 | Admitting: Podiatry

## 2020-08-20 DIAGNOSIS — M9689 Other intraoperative and postprocedural complications and disorders of the musculoskeletal system: Secondary | ICD-10-CM

## 2020-08-20 DIAGNOSIS — M960 Pseudarthrosis after fusion or arthrodesis: Secondary | ICD-10-CM

## 2020-08-20 NOTE — Progress Notes (Signed)
  Subjective:  Patient ID: Diana Francis, female    DOB: 1980/12/10,  MRN: [id number]  Chief Complaint  Patient presents with   Routine Post Op    POV # 10 Pt states she is still having soreness and some edema. Pt states she received the bone stimulator last Friday but does not know how to use it.     40 y.o. female presents with the above complaint. History confirmed with patient.     Objective:  Physical Exam: Mild pain to palpation along dorsal 1st metatarsal. Good ROM without restriction and only mild crepitus. No edema. Incision: Well-healed  Assessment:   1. Pseudarthrosis after fusion or arthrodesis   2. 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  Post-operative State with nonunion -XR reviewed with patient -Order Bone stimulator given nonunion -Possibly plan for screw removal at a later date, but looking less likely at this time.  No follow-ups on file.

## 2020-09-24 ENCOUNTER — Ambulatory Visit (INDEPENDENT_AMBULATORY_CARE_PROVIDER_SITE_OTHER): Payer: 59

## 2020-09-24 ENCOUNTER — Other Ambulatory Visit: Payer: Self-pay

## 2020-09-24 ENCOUNTER — Telehealth: Payer: Self-pay

## 2020-09-24 ENCOUNTER — Ambulatory Visit (INDEPENDENT_AMBULATORY_CARE_PROVIDER_SITE_OTHER): Payer: 59 | Admitting: Podiatry

## 2020-09-24 DIAGNOSIS — M960 Pseudarthrosis after fusion or arthrodesis: Secondary | ICD-10-CM | POA: Diagnosis not present

## 2020-09-24 DIAGNOSIS — Z9889 Other specified postprocedural states: Secondary | ICD-10-CM

## 2020-09-24 DIAGNOSIS — M21612 Bunion of left foot: Secondary | ICD-10-CM | POA: Diagnosis not present

## 2020-09-24 DIAGNOSIS — M9689 Other intraoperative and postprocedural complications and disorders of the musculoskeletal system: Secondary | ICD-10-CM | POA: Diagnosis not present

## 2020-09-24 DIAGNOSIS — M2012 Hallux valgus (acquired), left foot: Secondary | ICD-10-CM | POA: Diagnosis not present

## 2020-09-24 NOTE — Progress Notes (Signed)
  Subjective:  Patient ID: Diana Francis, female    DOB: February 18, 1980,  MRN: [id number]  Chief Complaint  Patient presents with   Routine Post Op    POV #11 DOS 02/28/2020 CORRECTION OF BUNION LT FOOT W/CUTTING & POSS SHIFTING OF BONE, POSS PHALANX OSTEOTOMY STRAIGHTEN TOE, OPEN VS MINIMALLY INVASIVE PROCEDURES. Pt complains of soreness and tightness at right 1st met. Occasional trouble with bone stimulator resetting.    40 y.o. female presents with the above complaint. History confirmed with patient.     Objective:  Physical Exam: Mild pain to palpation along plantar/distal 1st metatarsal. Good ROM without restriction and only mild crepitus. No edema. Incision: Well-healed  Assessment:   1. Post-operative state   2. Pseudarthrosis after fusion or arthrodesis   3. 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 Hallux valgus with bunions of left foot    Plan:  Patient was evaluated and treated and all questions answered.  Post-operative State with nonunion -XR reviewed with patient -Nonunion appears healed. -Would benefit from screw removal given plantar 1st MPJ pain.  -Patient has failed all conservative therapy and wishes to proceed with surgical intervention. All risks, benefits, and alternatives discussed with patient. No guarantees given. Consent reviewed and signed by patient. -Planned procedures: removal of hardware left 1st metatarsal -ASA 1 - Normal health patient; Risk factors: none -Post-op anticoagulation: chemoprophylaxis not indicated -DME dispensed for post-op use: none   No follow-ups on file.

## 2020-09-24 NOTE — Telephone Encounter (Signed)
DOS 09/25/2020  REMOVAL FIXATION DEEP LT - 20680  [employer] EFFECTIVE DATE - 01/27/2020  PLAN DEDUCTIBLE - $600.00 W/ $0.00 REMAINING OUT OF POCKET - $3000.00 W/ $885.47 REMAINING COPAY $200.00 COINSURANCE - 15%   Notification or Prior Authorization is not required for the requested services  This [employer] plan does not currently require a prior authorization for these services. If you have general questions about the prior authorization requirements, please call us at [phone] or visit [email or web address] > Clinician Resources > Advance and Admission Notification Requirements. The number above acknowledges your notification. Please write this number down for future reference. Notification is not a guarantee of coverage or payment.  Decision ID [id number]

## 2020-09-25 ENCOUNTER — Encounter: Payer: Self-pay | Admitting: Podiatry

## 2020-09-25 ENCOUNTER — Other Ambulatory Visit: Payer: Self-pay | Admitting: Podiatry

## 2020-09-25 DIAGNOSIS — Z4889 Encounter for other specified surgical aftercare: Secondary | ICD-10-CM | POA: Diagnosis not present

## 2020-09-25 MED ORDER — ONDANSETRON HCL 4 MG PO TABS: 4.0000 mg | ORAL_TABLET | Freq: Three times a day (TID) | ORAL | 0 refills | Status: AC | PRN

## 2020-09-25 MED ORDER — CEPHALEXIN 500 MG PO CAPS: ORAL_CAPSULE | ORAL | 0 refills | Status: AC

## 2020-09-25 MED ORDER — OXYCODONE-ACETAMINOPHEN 5-325 MG PO TABS
1.0000 | ORAL_TABLET | ORAL | 0 refills | Status: AC | PRN
Start: 2020-09-25 — End: ?

## 2020-10-01 ENCOUNTER — Ambulatory Visit (INDEPENDENT_AMBULATORY_CARE_PROVIDER_SITE_OTHER): Payer: 59 | Admitting: Podiatry

## 2020-10-01 ENCOUNTER — Other Ambulatory Visit: Payer: Self-pay

## 2020-10-01 DIAGNOSIS — Z9889 Other specified postprocedural states: Secondary | ICD-10-CM

## 2020-10-04 ENCOUNTER — Ambulatory Visit: Payer: 59

## 2020-10-04 ENCOUNTER — Other Ambulatory Visit: Payer: Self-pay

## 2020-10-04 DIAGNOSIS — Z9889 Other specified postprocedural states: Secondary | ICD-10-CM

## 2020-10-04 NOTE — Progress Notes (Signed)
POV #2 DOS 09/25/2020 LT 1ST METATARSAL REMOVAL OF HARDWARE 2 sutures were removed today with no complications.  Denies any f/v/n/sob/cp-

## 2020-10-07 NOTE — Progress Notes (Signed)
  Subjective:  Patient ID: Diana Francis, female    DOB: 04/30/1980,  MRN: [id number]  Chief Complaint  Patient presents with   Routine Post Op    POV #1 DOS 09/25/2020 LT 1ST METATARSAL REMOVAL OF HARDWARE    DOS: 09/25/20 Procedure: Removal of hardware left 1st metatarsal  40 y.o. female presents with the above complaint. History confirmed with patient.  Doing very well denies pain or discomfort  Objective:  Physical Exam: no tenderness at the surgical site, local edema noted, and calf supple, nontender. Incision: healing well, no significant drainage, no dehiscence, no significant erythema    Assessment:   1. Post-operative state     Plan:  Patient was evaluated and treated and all questions answered.  Post-operative State -Ok to start showering at this time. Advised they cannot soak. -Dressing applied consisting of abx ointment and band-aid -Continue weightbearing as tolerated normal shoe gear -Follow-up later in the week for suture removal  No follow-ups on file.

## 2020-10-15 ENCOUNTER — Ambulatory Visit (INDEPENDENT_AMBULATORY_CARE_PROVIDER_SITE_OTHER): Payer: 59 | Admitting: Podiatry

## 2020-10-15 ENCOUNTER — Other Ambulatory Visit: Payer: Self-pay

## 2020-10-15 DIAGNOSIS — M960 Pseudarthrosis after fusion or arthrodesis: Secondary | ICD-10-CM

## 2020-10-15 DIAGNOSIS — M9689 Other intraoperative and postprocedural complications and disorders of the musculoskeletal system: Secondary | ICD-10-CM

## 2020-10-15 DIAGNOSIS — Z9889 Other specified postprocedural states: Secondary | ICD-10-CM

## 2020-10-15 NOTE — Progress Notes (Signed)
  Subjective:  Patient ID: Diana Francis, female    DOB: 01/23/1981,  MRN: [id number]  Chief Complaint  Patient presents with   Routine Post Op     POV #2 DOS 09/25/2020 LT 1ST METATARSAL REMOVAL OF HARDWARE    DOS: 09/25/20 Procedure: Removal of hardware left 1st metatarsal  40 y.o. female presents with the above complaint. History confirmed with patient.  Doing well she has been applying Band-Aid to the incision as directed.  Does not have any pain and is wearing normal shoe gear comfortably  Objective:  Physical Exam: no tenderness at the surgical site, local edema noted, and calf supple, nontender.  Full range of motion of first metatarsophalangeal joint Incision: Well-healed  Assessment:   1. Post-operative state   2. Pseudarthrosis after fusion or arthrodesis   3. 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  Post-operative State -Incision appears healed.  Doing very well with good range of motion of the first metatarsophalangeal joint.  We will continue normal shoe gear and follow-up in 2 months to ensure that she is not having any further pain.  No follow-ups on file.

## 2020-12-05 ENCOUNTER — Other Ambulatory Visit: Payer: Self-pay | Admitting: Internal Medicine

## 2020-12-05 DIAGNOSIS — Z1231 Encounter for screening mammogram for malignant neoplasm of breast: Secondary | ICD-10-CM

## 2020-12-17 ENCOUNTER — Ambulatory Visit (INDEPENDENT_AMBULATORY_CARE_PROVIDER_SITE_OTHER): Payer: 59 | Admitting: Podiatry

## 2020-12-17 ENCOUNTER — Other Ambulatory Visit: Payer: Self-pay

## 2020-12-17 DIAGNOSIS — M9689 Other intraoperative and postprocedural complications and disorders of the musculoskeletal system: Secondary | ICD-10-CM

## 2020-12-17 DIAGNOSIS — Z9889 Other specified postprocedural states: Secondary | ICD-10-CM

## 2020-12-17 DIAGNOSIS — M960 Pseudarthrosis after fusion or arthrodesis: Secondary | ICD-10-CM

## 2020-12-17 NOTE — Progress Notes (Signed)
  Subjective:  Patient ID: Diana Francis, female    DOB: 19-Feb-1980,  MRN: [id number]  Chief Complaint  Patient presents with   Routine Post Op    POV #3 DOS 09/25/2020 LT 1ST METATARSAL REMOVAL OF HARDWARE. Pt states she has been doing well. No more pain.     DOS: 09/25/20 Procedure: Removal of hardware left 1st metatarsal  40 y.o. female presents with the above complaint. History confirmed with patient.  Denies pain or discomfort walking in normal shoegear without issues  Objective:  Physical Exam: no tenderness at the surgical site, no edema noted, and calf supple, nontender.  Full range of motion of first metatarsophalangeal joint Incision: Well-healed  Assessment:   1. Pseudarthrosis after fusion or arthrodesis   2. Post-operative state   3. 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  Post-operative State -Doing very well post-operatively, denies pain or discomfort. Full ROM noted. No issues or concerns. Will d/c with f/u only as needed.  No follow-ups on file.

## 2021-01-07 ENCOUNTER — Ambulatory Visit
Admission: RE | Admit: 2021-01-07 | Discharge: 2021-01-07 | Disposition: A | Discharge status: Home / Self Care | Payer: 59 | Source: Ambulatory Visit | Attending: Internal Medicine | Admitting: Internal Medicine

## 2021-01-07 DIAGNOSIS — Z1231 Encounter for screening mammogram for malignant neoplasm of breast: Secondary | ICD-10-CM

## 2021-01-08 ENCOUNTER — Other Ambulatory Visit: Payer: Self-pay | Admitting: Internal Medicine

## 2021-01-08 DIAGNOSIS — R928 Other abnormal and inconclusive findings on diagnostic imaging of breast: Secondary | ICD-10-CM

## 2021-01-21 ENCOUNTER — Other Ambulatory Visit: Payer: Self-pay | Admitting: Internal Medicine

## 2021-01-21 ENCOUNTER — Ambulatory Visit
Admission: RE | Admit: 2021-01-21 | Discharge: 2021-01-21 | Disposition: A | Discharge status: Home / Self Care | Payer: 59 | Source: Ambulatory Visit | Attending: Internal Medicine | Admitting: Internal Medicine

## 2021-01-21 DIAGNOSIS — R928 Other abnormal and inconclusive findings on diagnostic imaging of breast: Secondary | ICD-10-CM

## 2021-01-21 DIAGNOSIS — N6489 Other specified disorders of breast: Secondary | ICD-10-CM

## 2021-01-23 ENCOUNTER — Ambulatory Visit
Admission: RE | Admit: 2021-01-23 | Discharge: 2021-01-23 | Disposition: A | Discharge status: Home / Self Care | Payer: 59 | Source: Ambulatory Visit | Attending: Internal Medicine | Admitting: Internal Medicine

## 2021-01-23 DIAGNOSIS — N6489 Other specified disorders of breast: Secondary | ICD-10-CM

## 2021-01-23 HISTORY — PX: BREAST BIOPSY: SHX20

## 2021-03-03 ENCOUNTER — Other Ambulatory Visit: Payer: Self-pay | Admitting: General Surgery

## 2021-03-03 DIAGNOSIS — C259 Malignant neoplasm of pancreas, unspecified: Secondary | ICD-10-CM

## 2021-04-01 ENCOUNTER — Other Ambulatory Visit: Payer: 59

## 2021-04-02 ENCOUNTER — Other Ambulatory Visit: Payer: Self-pay

## 2021-04-02 ENCOUNTER — Ambulatory Visit
Admission: RE | Admit: 2021-04-02 | Discharge: 2021-04-02 | Disposition: A | Discharge status: Home / Self Care | Payer: 59 | Source: Ambulatory Visit | Attending: General Surgery | Admitting: General Surgery

## 2021-04-02 DIAGNOSIS — C259 Malignant neoplasm of pancreas, unspecified: Secondary | ICD-10-CM

## 2021-04-02 MED ORDER — GADOBENATE DIMEGLUMINE 529 MG/ML IV SOLN
13.0000 mL | Freq: Once | INTRAVENOUS | Status: AC | PRN
  Administered 2021-04-02: 13 mL via INTRAVENOUS

## 2021-12-10 ENCOUNTER — Other Ambulatory Visit: Payer: Self-pay | Admitting: Internal Medicine

## 2021-12-10 DIAGNOSIS — Z1231 Encounter for screening mammogram for malignant neoplasm of breast: Secondary | ICD-10-CM

## 2022-01-28 ENCOUNTER — Ambulatory Visit
Admission: RE | Admit: 2022-01-28 | Discharge: 2022-01-28 | Disposition: A | Discharge status: Home / Self Care | Payer: 59 | Source: Ambulatory Visit | Attending: Internal Medicine | Admitting: Internal Medicine

## 2022-01-28 DIAGNOSIS — Z1231 Encounter for screening mammogram for malignant neoplasm of breast: Secondary | ICD-10-CM

## 2022-08-30 IMAGING — MG MM BREAST LOCALIZATION CLIP
4 series · 4 of 12 positions shown · non-contrast
Comparison: Previous exam(s).

CLINICAL DATA: Evaluate RIBBON clip placement following
ultrasound-guided LEFT breast biopsy.

EXAM:
3D DIAGNOSTIC LEFT MAMMOGRAM POST ULTRASOUND BIOPSY

[L CC synth-2D]
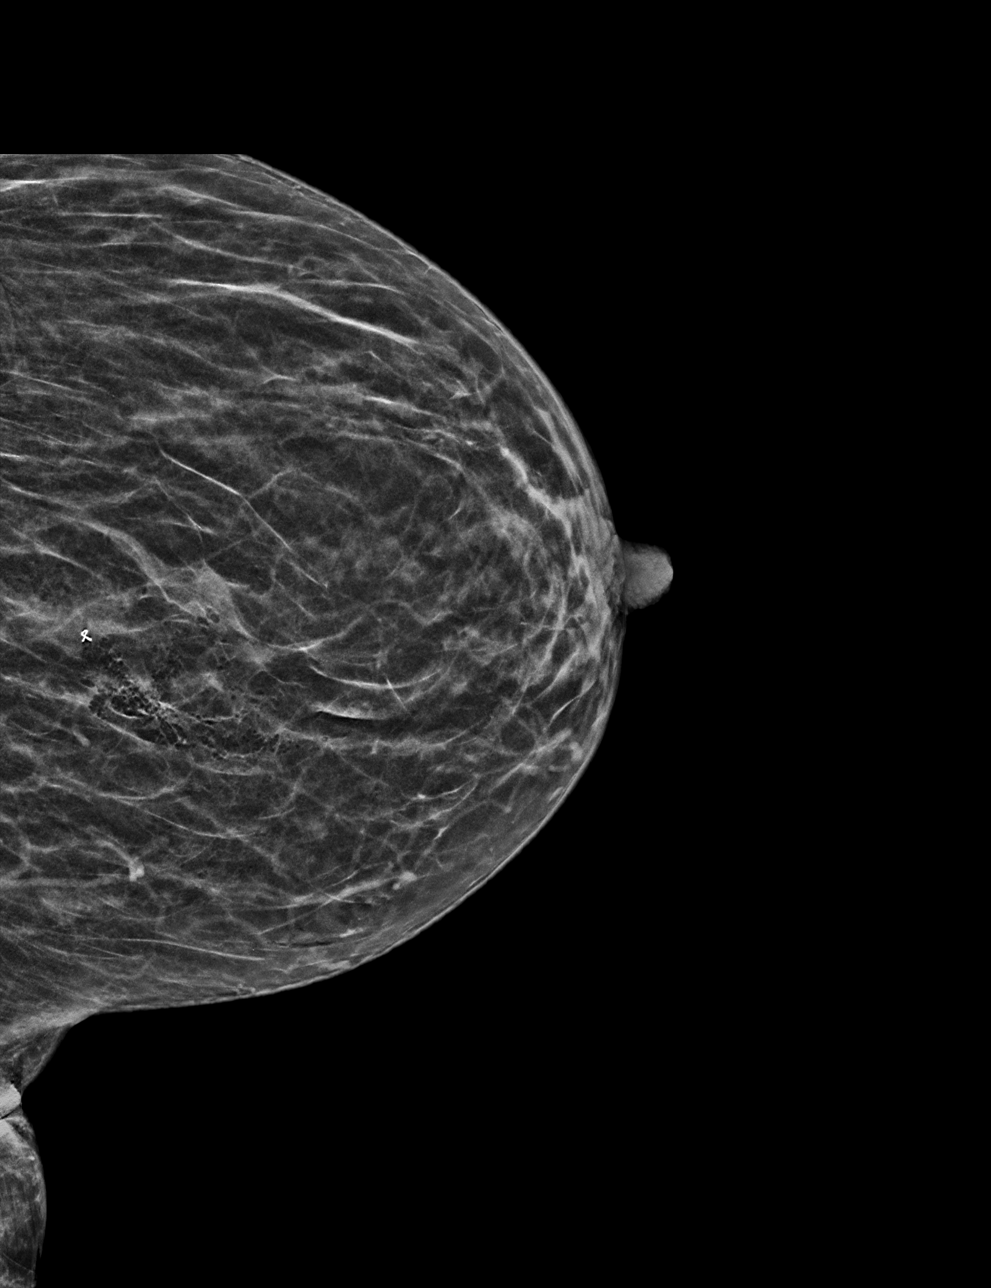

[L ML synth-2D]
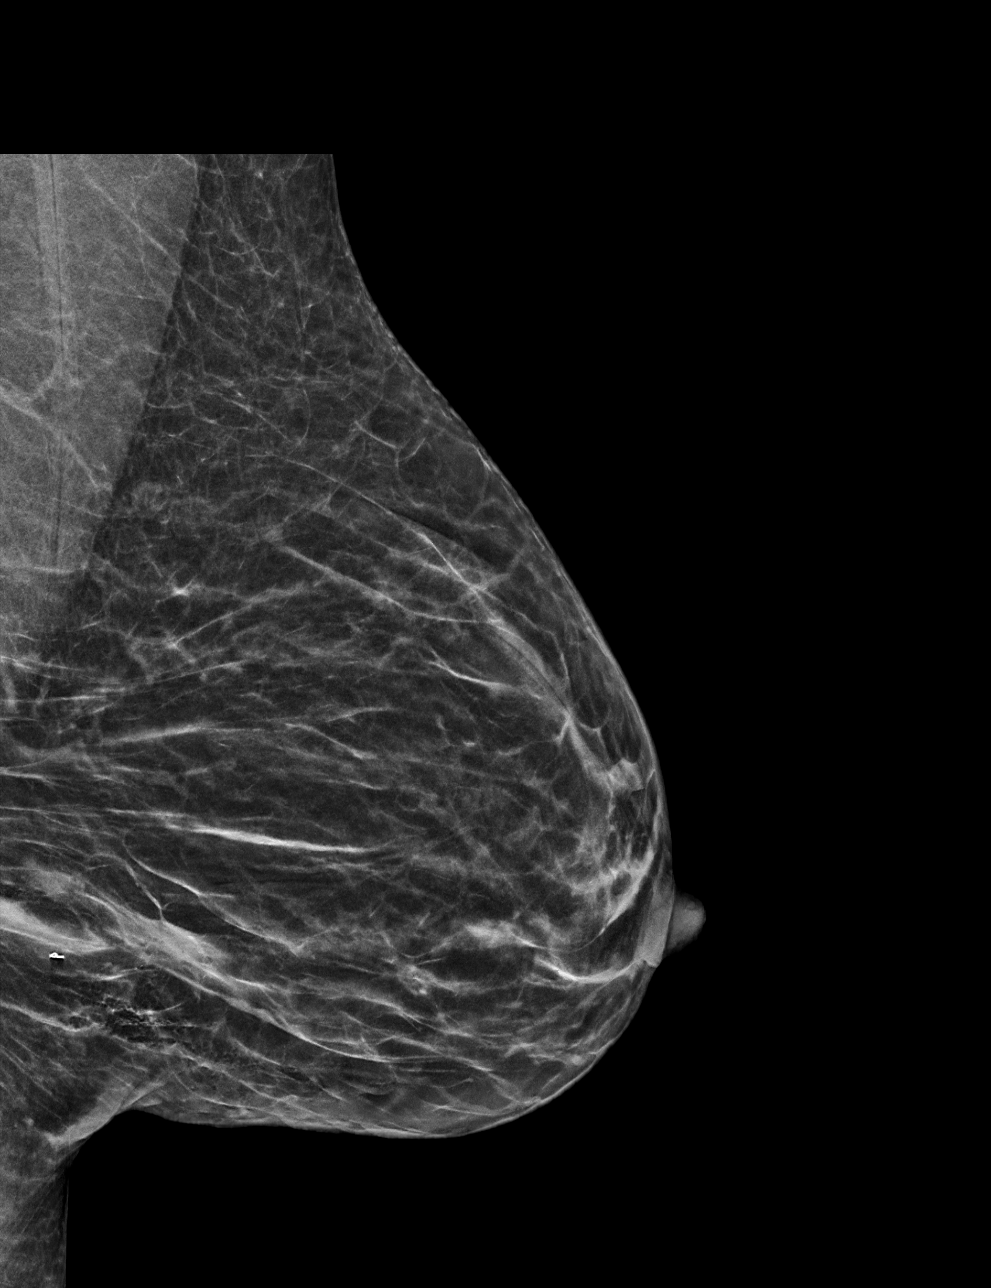

[L ML tomo · tomo slice 23/44.0]
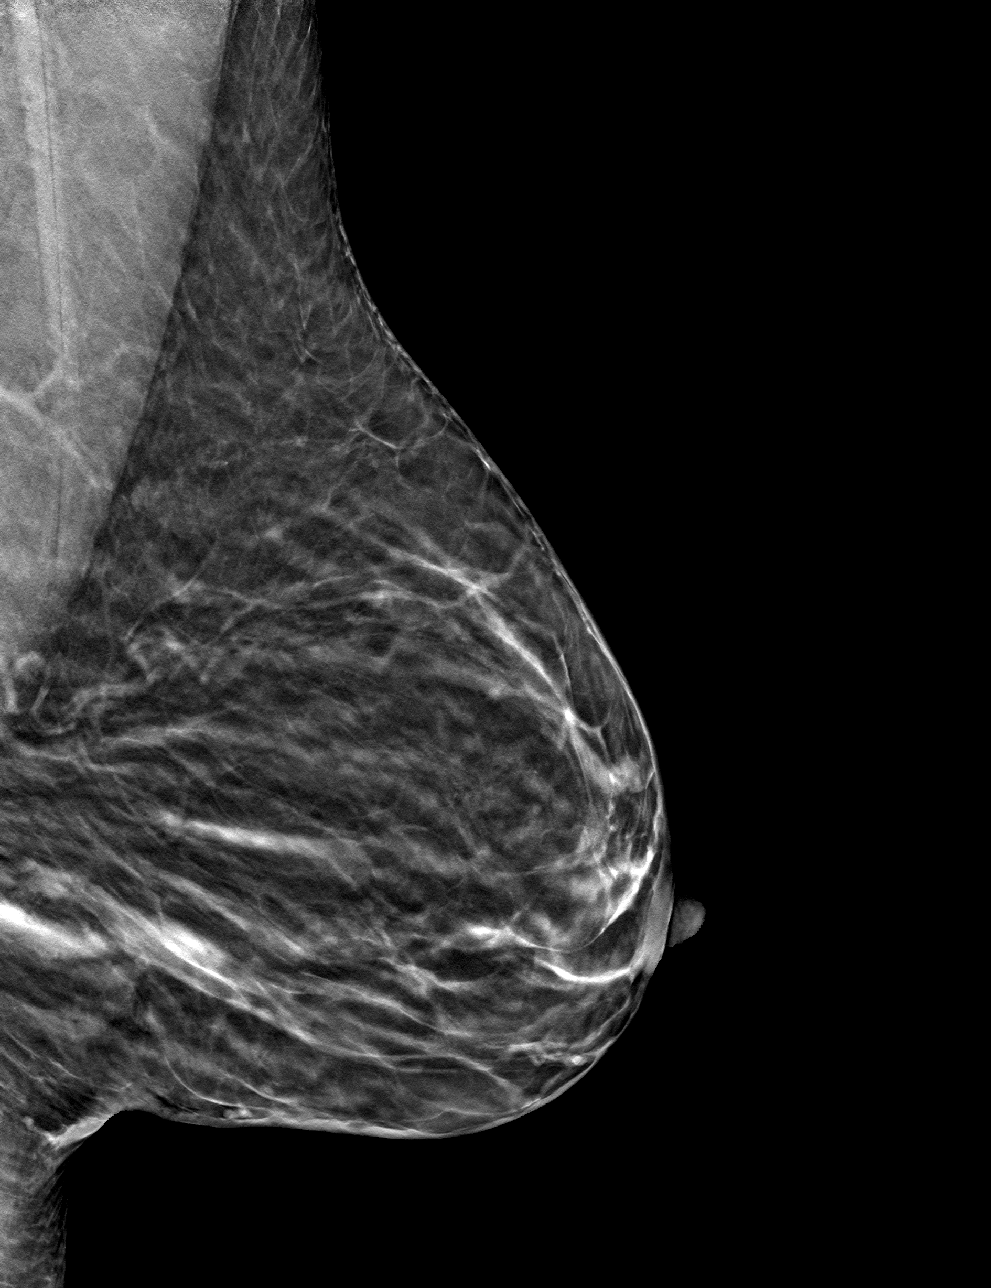

[L CC tomo · tomo slice 20/39.0]
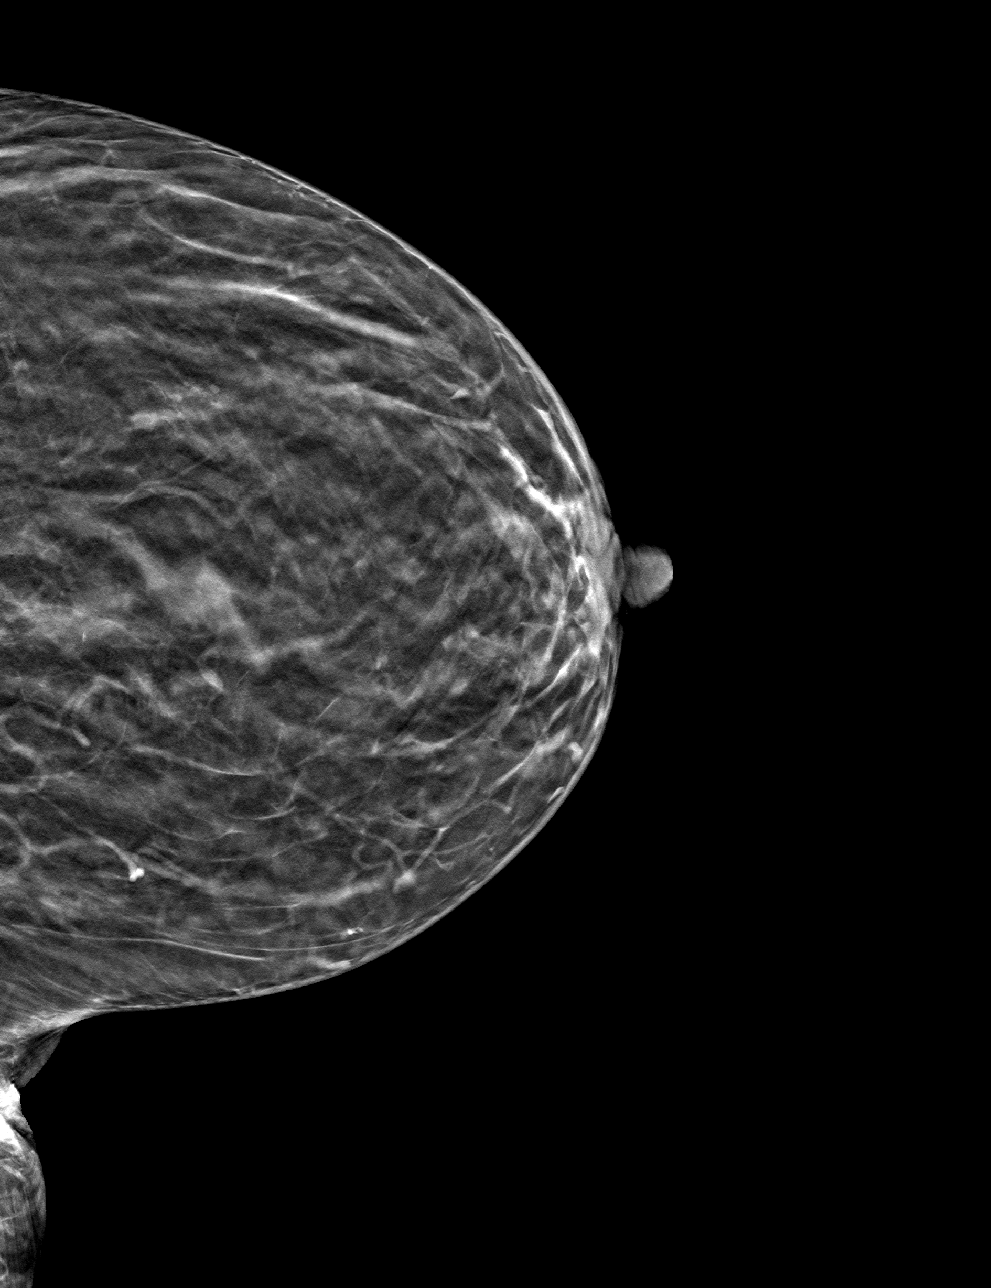

[4 of 12 positions shown; findings below may reference images not displayed]

FINDINGS: 3D Mammographic images were obtained following ultrasound guided
biopsy of the 2.1 cm heterogeneous area at the 7 o'clock position of
the LEFT breast. The RIBBON biopsy marking clip is in expected
position at the site of biopsy.
IMPRESSION: Appropriate positioning of the RIBBON shaped biopsy marking clip at
the site of biopsy in the LOWER INNER LEFT breast.

Final Assessment: Post Procedure Mammograms for Marker Placement

## 2022-08-30 IMAGING — US US BREAST BX W LOC DEV 1ST LESION IMG BX SPEC US GUIDE*L*
1 series · 12 of 15 positions shown · non-contrast
Comparison: Previous exam(s).
COMPARISON: Previous exam(s).

Addendum:
CLINICAL DATA: 40-year-old female for tissue sampling of 2.1 cm
heterogeneous area within the LOWER INNER LEFT breast.

EXAM:
ULTRASOUND GUIDED LEFT BREAST CORE NEEDLE BIOPSY

[Series 1: us breast bx w loc dev 1st lesion img bx spec us g · 0.06mm/px · 12 of 15 slices shown]
[im 1/15]
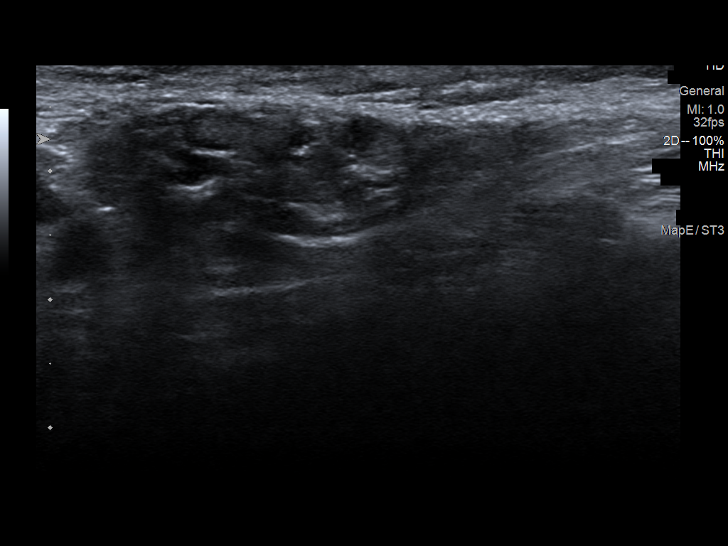
[im 2/15]
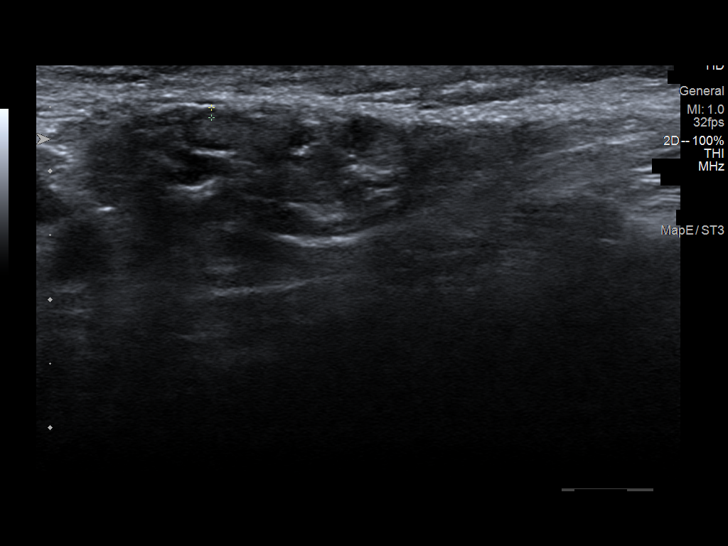
[im 4/15]
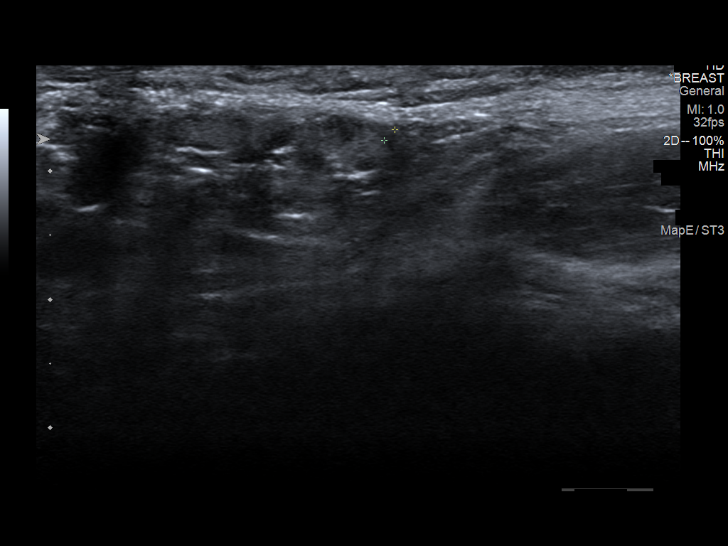
[im 5/15]
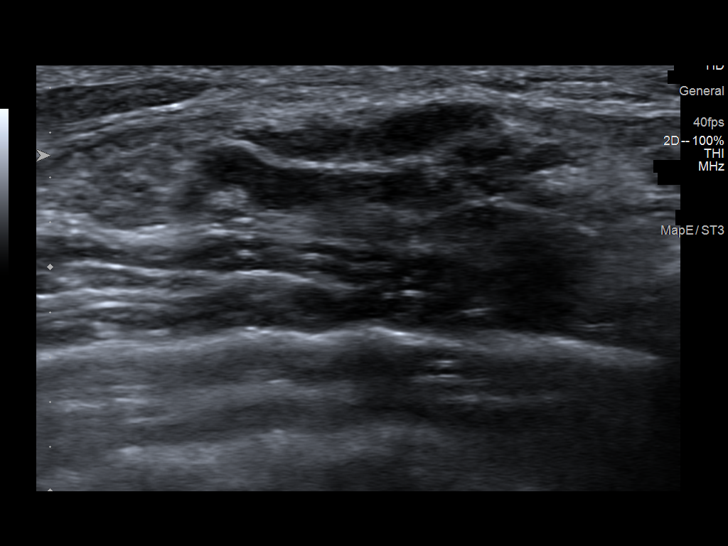
[im 6/15]
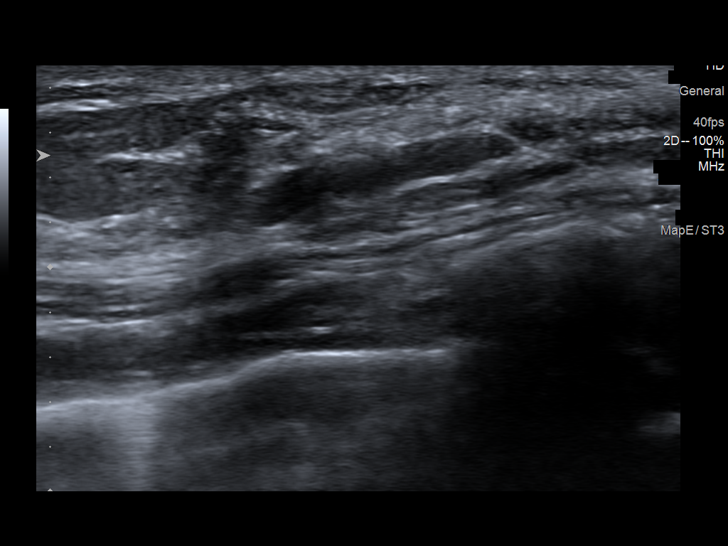
[im 7/15]
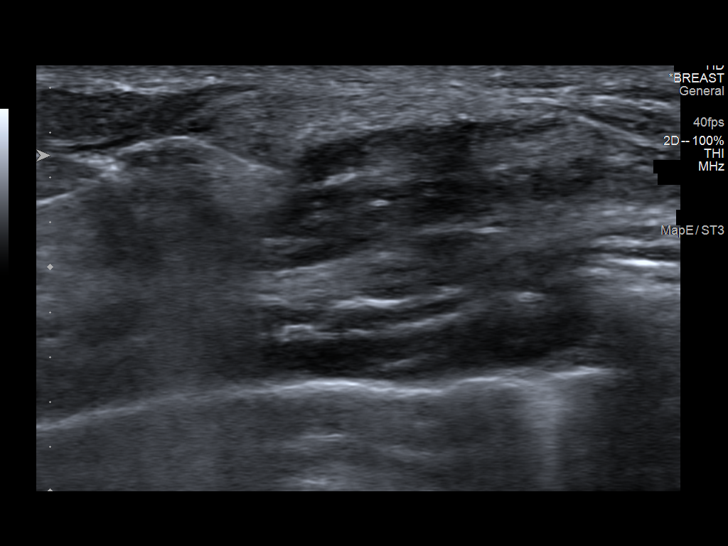
[im 9/15]
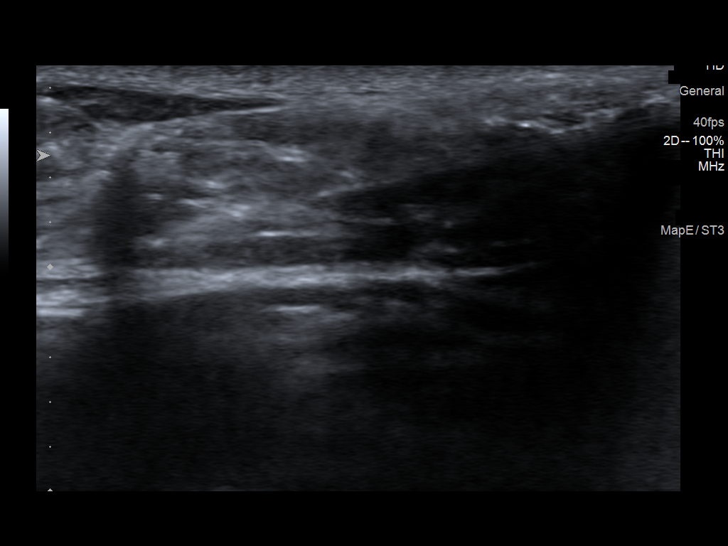
[im 10/15]
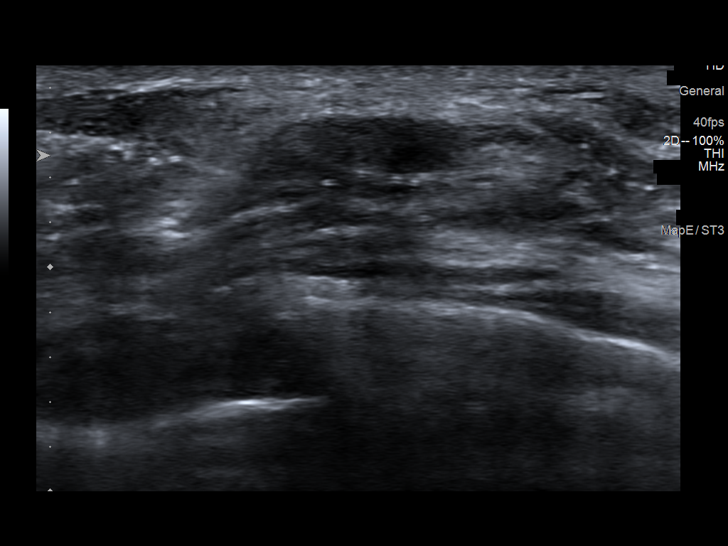
[im 11/15]
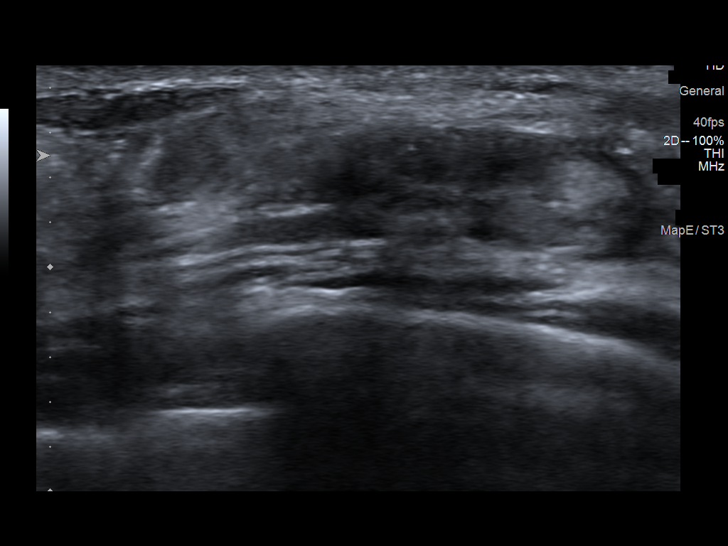
[im 12/15]
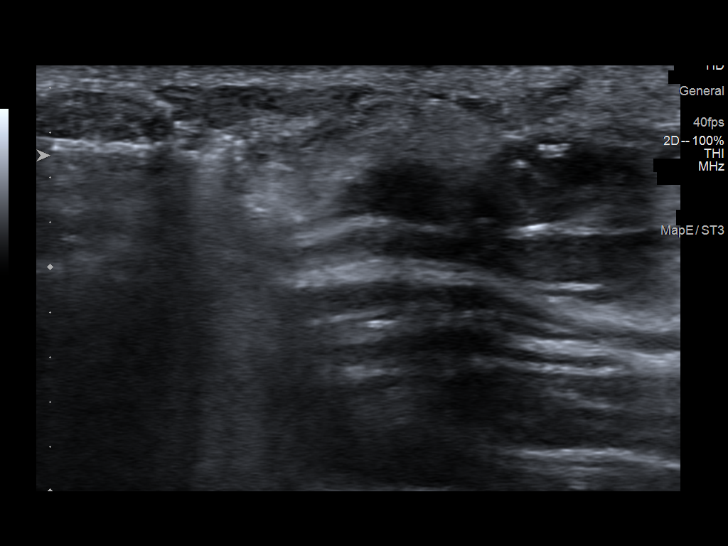
[im 14/15]
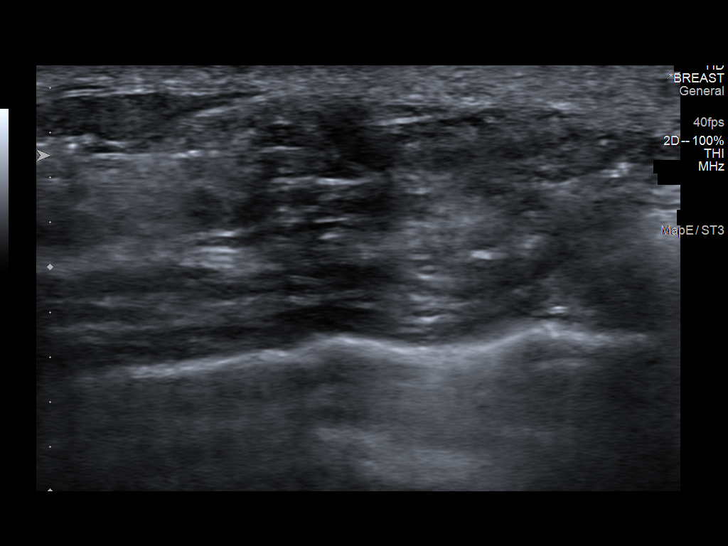
[im 15/15]
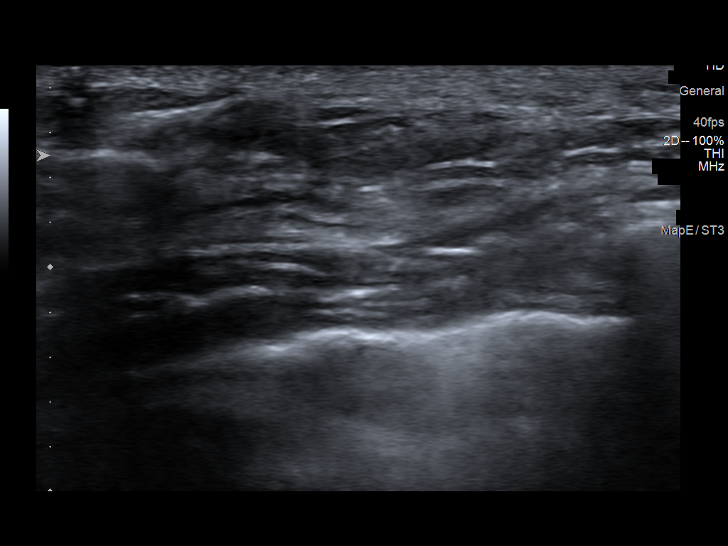

[12 of 15 positions shown; findings below may reference images not displayed]



Lesion quadrant: LOWER INNER LEFT breast

Using sterile technique and 1% Lidocaine as local anesthetic, under
direct ultrasound visualization, a 12 gauge Santropecraig device was
used to perform biopsy of the 2.1 cm heterogeneous area at the 7
o'clock position of the LEFT breast 7 cm from the nipple using a
MEDIAL approach. At the conclusion of the procedure a RIBBON tissue
marker clip was deployed into the biopsy cavity. Follow up 2 view
mammogram was performed and dictated separately.
IMPRESSION: Ultrasound guided biopsy of 2.1 cm heterogeneous area within the
LOWER INNER LEFT breast. No apparent complications.

ADDENDUM:
Pathology revealed PSEUDOANGIOMATOUS STROMAL HYPERPLASIA- NO ATYPIA
OR MALIGNANCY of the LEFT breast, lower inner, 7 o'clock (ribbon
clip). This was found to be concordant by Dr. Jesperi Ignatius.

Pathology results were discussed with the patient by telephone. The
patient reported doing well after the biopsy with tenderness at the
site. Post biopsy instructions and care were reviewed and questions
were answered. The patient was encouraged to call The [REDACTED]

The patient was instructed to return for annual screening
mammography and informed a reminder notice would be sent regarding
this appointment.

Pathology results reported by Goeg Scardaci RN on 01/28/2021.



Lesion quadrant: LOWER INNER LEFT breast

Using sterile technique and 1% Lidocaine as local anesthetic, under
direct ultrasound visualization, a 12 gauge Santropecraig device was
used to perform biopsy of the 2.1 cm heterogeneous area at the 7
o'clock position of the LEFT breast 7 cm from the nipple using a
MEDIAL approach. At the conclusion of the procedure a RIBBON tissue
marker clip was deployed into the biopsy cavity. Follow up 2 view
mammogram was performed and dictated separately.
IMPRESSION: Ultrasound guided biopsy of 2.1 cm heterogeneous area within the
LOWER INNER LEFT breast. No apparent complications.

## 2022-12-15 ENCOUNTER — Other Ambulatory Visit: Payer: Self-pay | Admitting: Internal Medicine

## 2022-12-15 DIAGNOSIS — Z1231 Encounter for screening mammogram for malignant neoplasm of breast: Secondary | ICD-10-CM

## 2023-02-01 ENCOUNTER — Ambulatory Visit: Payer: 59

## 2023-02-12 ENCOUNTER — Ambulatory Visit
Admission: RE | Admit: 2023-02-12 | Discharge: 2023-02-12 | Disposition: A | Discharge status: Home / Self Care | Payer: 59 | Source: Ambulatory Visit | Attending: Internal Medicine | Admitting: Internal Medicine

## 2023-02-12 DIAGNOSIS — Z1231 Encounter for screening mammogram for malignant neoplasm of breast: Secondary | ICD-10-CM

## 2023-03-29 ENCOUNTER — Other Ambulatory Visit: Payer: Self-pay | Admitting: General Surgery

## 2023-03-29 DIAGNOSIS — C259 Malignant neoplasm of pancreas, unspecified: Secondary | ICD-10-CM

## 2023-05-07 ENCOUNTER — Ambulatory Visit
Admission: RE | Admit: 2023-05-07 | Discharge: 2023-05-07 | Disposition: A | Discharge status: Home / Self Care | Source: Ambulatory Visit | Attending: General Surgery | Admitting: General Surgery

## 2023-05-07 DIAGNOSIS — C259 Malignant neoplasm of pancreas, unspecified: Secondary | ICD-10-CM

## 2023-05-07 MED ORDER — GADOPICLENOL 0.5 MMOL/ML IV SOLN
6.0000 mL | Freq: Once | INTRAVENOUS | Status: AC | PRN
  Administered 2023-05-07: 6 mL via INTRAVENOUS

## 2023-12-30 ENCOUNTER — Other Ambulatory Visit: Payer: Self-pay | Admitting: Internal Medicine

## 2023-12-30 DIAGNOSIS — Z1231 Encounter for screening mammogram for malignant neoplasm of breast: Secondary | ICD-10-CM

## 2024-02-15 ENCOUNTER — Ambulatory Visit
Admission: RE | Admit: 2024-02-15 | Discharge: 2024-02-15 | Disposition: A | Source: Ambulatory Visit | Attending: Internal Medicine | Admitting: Internal Medicine

## 2024-02-15 ENCOUNTER — Other Ambulatory Visit: Payer: Self-pay | Admitting: Medical Genetics

## 2024-02-15 DIAGNOSIS — Z1231 Encounter for screening mammogram for malignant neoplasm of breast: Secondary | ICD-10-CM

## 2024-02-16 ENCOUNTER — Other Ambulatory Visit

## 2024-02-26 ENCOUNTER — Other Ambulatory Visit: Payer: Self-pay

## 2024-03-04 ENCOUNTER — Other Ambulatory Visit: Payer: Self-pay
